# Patient Record
Sex: Female | Born: 1970 | Hispanic: No
Health system: Northeastern US, Academic
[De-identification: ages and names within clinical notes are randomized; demographics above are authoritative.]

## PROBLEM LIST (undated history)

## (undated) DIAGNOSIS — B181 Chronic viral hepatitis B without delta-agent: Secondary | ICD-10-CM

## (undated) DIAGNOSIS — D219 Benign neoplasm of connective and other soft tissue, unspecified: Secondary | ICD-10-CM

## (undated) HISTORY — DX: Chronic viral hepatitis B without delta-agent: B18.1

## (undated) HISTORY — DX: Benign neoplasm of connective and other soft tissue, unspecified: D21.9

## (undated) MED FILL — WEGOVY 0.25 MG/0.5 ML SUBCUTANEOUS PEN INJECTOR: 0.25 0.25 mg/0.5 mL | SUBCUTANEOUS | 28 days supply | Qty: 2 | Fill #0 | Status: CN

---

## 2014-05-21 ENCOUNTER — Other Ambulatory Visit: Payer: Self-pay | Admitting: Obstetrics & Gynecology

## 2014-05-21 DIAGNOSIS — Z006 Encounter for examination for normal comparison and control in clinical research program: Secondary | ICD-10-CM

## 2014-05-27 ENCOUNTER — Ambulatory Visit
Admission: RE | Admit: 2014-05-27 | Discharge: 2014-05-27 | Disposition: A | Discharge status: Home / Self Care | Payer: No Typology Code available for payment source | Source: Ambulatory Visit | Attending: Obstetrics & Gynecology | Admitting: Obstetrics & Gynecology

## 2014-05-27 DIAGNOSIS — Z006 Encounter for examination for normal comparison and control in clinical research program: Secondary | ICD-10-CM

## 2014-06-09 ENCOUNTER — Ambulatory Visit: Payer: No Typology Code available for payment source | Attending: Internal Medicine

## 2014-10-21 ENCOUNTER — Encounter: Payer: Self-pay | Admitting: Internal Medicine

## 2014-10-21 ENCOUNTER — Ambulatory Visit: Payer: Self-pay | Attending: Internal Medicine | Admitting: Internal Medicine

## 2014-10-21 VITALS — BP 125/85 | HR 81 | Temp 98.3°F | Resp 16 | Wt 222.8 lb

## 2014-10-21 DIAGNOSIS — Z23 Encounter for immunization: Secondary | ICD-10-CM

## 2014-10-21 DIAGNOSIS — R894 Abnormal immunological findings in specimens from other organs, systems and tissues: Secondary | ICD-10-CM

## 2014-10-21 DIAGNOSIS — R768 Other specified abnormal immunological findings in serum: Secondary | ICD-10-CM | POA: Insufficient documentation

## 2014-10-21 DIAGNOSIS — D259 Leiomyoma of uterus, unspecified: Secondary | ICD-10-CM

## 2014-10-21 LAB — BASIC METABOLIC PANEL
BUN: 7 mg/dL (ref 7–25)
CALCIUM: 8.8 mg/dL (ref 8.6–10.2)
CO2: 27 mmol/L (ref 20–31)
Chloride: 107 mmol/L (ref 98–110)
Creat: 0.48 mg/dL — ABNORMAL LOW (ref 0.50–1.10)
GLUCOSE: 86 mg/dL (ref 65–99)
Potassium: 4 mmol/L (ref 3.5–5.3)
Sodium: 140 mmol/L (ref 135–146)

## 2014-10-21 LAB — CBC WITH DIFFERENTIAL/PLATELET
BASOS PCT: 0 % (ref 0–1)
Basophils Absolute: 0 10*3/uL (ref 0.0–0.1)
EOS ABS: 0.1 10*3/uL (ref 0.0–0.7)
EOS PCT: 2 % (ref 0–5)
HCT: 31.6 % — ABNORMAL LOW (ref 36.0–46.0)
Hemoglobin: 9.1 g/dL — ABNORMAL LOW (ref 12.0–15.0)
Lymphocytes Relative: 38 % (ref 12–46)
Lymphs Abs: 2.2 10*3/uL (ref 0.7–4.0)
MCH: 20.4 pg — ABNORMAL LOW (ref 26.0–34.0)
MCHC: 28.8 g/dL — AB (ref 30.0–36.0)
MCV: 70.7 fL — ABNORMAL LOW (ref 78.0–100.0)
MONO ABS: 0.5 10*3/uL (ref 0.1–1.0)
MPV: 9.2 fL (ref 8.6–12.4)
Monocytes Relative: 8 % (ref 3–12)
Neutro Abs: 3 10*3/uL (ref 1.7–7.7)
Neutrophils Relative %: 52 % (ref 43–77)
PLATELETS: 387 10*3/uL (ref 150–400)
RBC: 4.47 MIL/uL (ref 3.87–5.11)
RDW: 19.5 % — ABNORMAL HIGH (ref 11.5–15.5)
WBC: 5.7 10*3/uL (ref 4.0–10.5)

## 2014-10-21 NOTE — Patient Instructions (Signed)
I have placed orders, you will receive a call soon from GYN and Infectious Disease   Hepatitis B Hepatitis B is a viral infection of the liver. There are two kinds of hepatitis B:  Acute hepatitis B. Hepatitis B that lasts six months or less is called acute hepatitis B.  Chronic hepatitis B. Hepatitis B that lasts more than six months is called long-term (chronic) hepatitis B. Chronic hepatitis B can lead to liver failure, scarring of the liver (cirrhosis), or liver cancer. Acute hepatitis B can turn into chronic hepatitis B. Most adults with acute hepatitis B do not develop chronic hepatitis B. Infants and young children who get hepatitis B are more likely to develop chronic hepatitis B than adults who get hepatitis B. CAUSES Hepatitis B is caused by the hepatitis B virus (HBV). The virus is passed from one person to another through blood, birth, sex, or bodily fluids, such as:  Breast milk.  Tears.  Saliva. RISK FACTORS The risk factors for hepatitis B include:  Having unprotected sex with someone who is infected.  Using drugs with needles. SIGNS AND SYMPTOMS Symptoms of hepatitis B may include:  Loss of appetite.  Fatigue.  Nausea.  Vomiting.  Stomach pain.  Dark yellow urine.  Yellowish skin and eyes (jaundice). Hepatitis B does not always cause symptoms. DIAGNOSIS Your health care provider will do a blood test to diagnose hepatitis B. TREATMENT You will need to prevent further injury to the liver by avoiding alcohol and medicines that can be hard for the liver to metabolize. Treatment for chronic hepatitis B may include antiviral medicine. This medicine may help:  Lower your risk of liver failure.  Lower your ability to infect others with hepatitis B.  Lower your risk of scarring your liver (cirrhosis).  Lower your risk of liver cancer. HOME CARE INSTRUCTIONS   Rest as needed.  Avoid alcohol.  Take medicines only as directed by your health care  provider.  Do not take any medicine without approval from your health care provider. This includes over-the-counter medicine that is usually taken for fever or pain.  Do not have sex unless approved by your health care provider.  Do not share toothbrushes, nail clippers, razors, or needles with others. SEEK IMMEDIATE MEDICAL CARE IF:   You are unable to eat or drink.  You have a fever along with nausea or vomiting.  You feel confused.  You develop jaundice or your chronic jaundice becomes more severe.  You have trouble breathing.  You develop a rash.  Your skin, throat, mouth, or face becomes swollen.  Your body shakes or twitches (seizure).  You become very sleepy or have trouble waking up.   This information is not intended to replace advice given to you by your health care provider. Make sure you discuss any questions you have with your health care provider.   Document Released: 12/30/1999 Document Revised: 09/22/2014 Document Reviewed: 04/10/2013 Elsevier Interactive Patient Education [employer].

## 2014-10-21 NOTE — Progress Notes (Signed)
Patient ID: Kristin Hicks, female   DOB: 1970-02-15, 44 y.o.   MRN: [id number]   ZLD:[id number]  [id number]  DOB - 02-22-1970  CC:  Chief Complaint  Patient presents with  . Establish Care       HPI: Kristin Hicks is a 44 y.o. female here today to establish medical care. Patient has a past medical history of uterine fibroids and Hepatitis B. She states that she was in a research study with a OB/GYN on [place] for fibroids and found out that she had Hepatitis B in April. She was told to come here to apply for the orange card and get a referral for the infectious disease doctor because several test were completed to confirm diagnosis. She is not sexually active. She has been in the [place] for 14 years. She states that she was told she has very large fibroids that extend up past her navel. Her first 3 days she states that she buys super pads and goes through 1.5 packs of pads in 2 days. She has to use 2-3 super pads at a time. She has no children and does not desire pregnancy. Patient states that she has a hard time voiding due to the tumor pressing on her bladder and bowels. She feels uncomfortable all day.  Patient has No headache, No chest pain, No Nausea, No new weakness tingling or numbness, No Cough - SOB.  Allergies  Allergen Reactions  . Chloroquine    History reviewed. No pertinent past medical history. No current outpatient prescriptions on file prior to visit.   No current facility-administered medications on file prior to visit.   History reviewed. No pertinent family history. Social History   Social History  . Marital Status: Single    Spouse Name: N/A  . Number of Children: N/A  . Years of Education: N/A   Occupational History  . Not on file.   Social History Main Topics  . Smoking status: Never Smoker   . Smokeless tobacco: Not on file  . Alcohol Use: No  . Drug Use: No  . Sexual Activity: Not on file   Other Topics Concern  . Not on file    Social History Narrative  . No narrative on file    Review of Systems: Other than what is stated in HPI, all other systems are negative.   Objective:   Filed Vitals:   10/21/14 1553  BP: 125/85  Pulse: 81  Temp: 98.3 F (36.8 C)  Resp: 16    Physical Exam  Constitutional: She is oriented to person, place, and time.  Eyes: No scleral icterus.  Cardiovascular: Normal rate, regular rhythm and normal heart sounds.   Pulmonary/Chest: Effort normal and breath sounds normal.  Abdominal: Bowel sounds are normal. She exhibits distension and mass. There is no tenderness.  Very large mass palpated in abdomen extending up to navel Unable to palpate liver margins  Neurological: She is alert and oriented to person, place, and time.  Skin: Skin is warm and dry.  Psychiatric: She has a normal mood and affect.     No results found for: WBC, HGB, HCT, MCV, PLT No results found for: CREATININE, BUN, NA, K, CL, CO2  No results found for: HGBA1C Lipid Panel  No results found for: CHOL, TRIG, HDL, CHOLHDL, VLDL, LDLCALC     Assessment and plan:   Yolanda was seen today for establish care.  Diagnoses and all orders for this visit:  Uterine leiomyoma, unspecified location -  Ambulatory referral to Gynecology -     Basic Metabolic Panel -     CBC with Differential Will check hemoglobin and start patient on ferrous sulfate if necessary.   Hepatitis B antibody positive -     Ambulatory referral to Infectious Disease -     Hepatitis panel, acute Since I do not have records of positive Hep B, I will recheck so that ID can have confirmation of results.  I will request all records.  Need for influenza vaccination -     Flu Vaccine QUAD 36+ mos PF IM (Fluarix & Fluzone Quad PF)   Return if symptoms worsen or fail to improve.  The patient was given clear instructions to go to ER or return to medical center if symptoms don't improve, worsen or new problems develop. The patient  verbalized understanding. The patient was told to call to get lab results if they haven't heard anything in the next week.     Lance Bosch, [facility] [phone] 10/21/2014, 4:10 PM

## 2014-10-21 NOTE — Progress Notes (Signed)
Patient is here to establish care Patient also complains of fibroids  Patient states she has Hepatitis B Patient needs referrals to infectious disease and obgyn

## 2014-10-22 LAB — HEPATITIS PANEL, ACUTE
HCV AB: NEGATIVE
Hep A IgM: NONREACTIVE
Hep B C IgM: NONREACTIVE
Hepatitis B Surface Ag: POSITIVE — AB

## 2014-10-22 LAB — HEPATITIS B SURF AG CONFIRMATION: HEPATITIS B SURFACE ANTIGEN CONFIRMATION: POSITIVE — AB

## 2014-10-25 ENCOUNTER — Telehealth: Payer: Self-pay

## 2014-10-25 DIAGNOSIS — D259 Leiomyoma of uterus, unspecified: Secondary | ICD-10-CM | POA: Insufficient documentation

## 2014-10-25 DIAGNOSIS — R768 Other specified abnormal immunological findings in serum: Secondary | ICD-10-CM | POA: Insufficient documentation

## 2014-10-25 MED ORDER — FERROUS SULFATE 325 (65 FE) MG PO TABS: 325.0000 mg | ORAL_TABLET | Freq: Every day | ORAL | Status: AC

## 2014-10-25 NOTE — Telephone Encounter (Signed)
Patient called back please f/u °

## 2014-10-25 NOTE — Telephone Encounter (Signed)
Patient not available Left message on voice  Mail to return  Our call

## 2014-10-25 NOTE — Telephone Encounter (Signed)
-----   Message from Lance Bosch, NP sent at 10/24/2014  9:05 PM EDT ----- Please send ferrous sulfate 325 mg to take BID. She is anemic. Will recheck in 6 weeks

## 2014-10-25 NOTE — Telephone Encounter (Signed)
Patient returned phone call. Please f/u with pt.

## 2014-10-26 ENCOUNTER — Telehealth: Payer: Self-pay

## 2014-10-26 ENCOUNTER — Other Ambulatory Visit: Payer: Self-pay | Admitting: General Practice

## 2014-10-26 ENCOUNTER — Encounter: Payer: Self-pay | Admitting: Obstetrics & Gynecology

## 2014-10-26 DIAGNOSIS — D259 Leiomyoma of uterus, unspecified: Secondary | ICD-10-CM

## 2014-10-26 NOTE — Telephone Encounter (Signed)
Patient returned call. Please f/u with pt.

## 2014-10-26 NOTE — Telephone Encounter (Signed)
Returned patient phone call Patient not available Left message on voice mail to return our call 

## 2014-11-11 ENCOUNTER — Ambulatory Visit (HOSPITAL_COMMUNITY)
Admission: RE | Admit: 2014-11-11 | Discharge: 2014-11-11 | Disposition: A | Discharge status: Home / Self Care | Payer: No Typology Code available for payment source | Source: Ambulatory Visit | Attending: Family Medicine | Admitting: Family Medicine

## 2014-11-11 DIAGNOSIS — N920 Excessive and frequent menstruation with regular cycle: Secondary | ICD-10-CM | POA: Insufficient documentation

## 2014-11-11 DIAGNOSIS — D250 Submucous leiomyoma of uterus: Secondary | ICD-10-CM | POA: Insufficient documentation

## 2014-11-11 DIAGNOSIS — D259 Leiomyoma of uterus, unspecified: Secondary | ICD-10-CM

## 2014-11-15 ENCOUNTER — Ambulatory Visit (INDEPENDENT_AMBULATORY_CARE_PROVIDER_SITE_OTHER): Payer: No Typology Code available for payment source | Admitting: Obstetrics & Gynecology

## 2014-11-15 ENCOUNTER — Encounter: Payer: Self-pay | Admitting: Obstetrics & Gynecology

## 2014-11-15 VITALS — BP 101/68 | HR 70 | Temp 98.2°F | Resp 16 | Ht 65.0 in | Wt 220.9 lb

## 2014-11-15 DIAGNOSIS — D259 Leiomyoma of uterus, unspecified: Secondary | ICD-10-CM

## 2014-11-15 MED ORDER — MEDROXYPROGESTERONE ACETATE 10 MG PO TABS: 20.0000 mg | ORAL_TABLET | Freq: Every day | ORAL | Status: AC

## 2014-11-15 NOTE — Patient Instructions (Signed)
Leuprolide injection  What is this medicine?  LEUPROLIDE (loo PROE lide) is a man-made hormone. It is used to treat the symptoms of prostate cancer. This medicine may also be used to treat children with early onset of puberty. It may be used for other hormonal conditions.  This medicine may be used for other purposes; ask your health care provider or pharmacist if you have questions.  What should I tell my health care provider before I take this medicine?  They need to know if you have any of these conditions:  -diabetes  -heart disease or previous heart attack  -high blood pressure  -high cholesterol  -pain or difficulty passing urine  -spinal cord metastasis  -stroke  -tobacco smoker  -an unusual or allergic reaction to leuprolide, benzyl alcohol, other medicines, foods, dyes, or preservatives  -pregnant or trying to get pregnant  -breast-feeding  How should I use this medicine?  This medicine is for injection under the skin or into a muscle. You will be taught how to prepare and give this medicine. Use exactly as directed. Take your medicine at regular intervals. Do not take your medicine more often than directed.  It is important that you put your used needles and syringes in a special sharps container. Do not put them in a trash can. If you do not have a sharps container, call your pharmacist or healthcare provider to get one.  Talk to your pediatrician regarding the use of this medicine in children. While this medicine may be prescribed for children as young as 8 years for selected conditions, precautions do apply.  Overdosage: If you think you have taken too much of this medicine contact a poison control center or emergency room at once.  NOTE: This medicine is only for you. Do not share this medicine with others.  What if I miss a dose?  If you miss a dose, take it as soon as you can. If it is almost time for your next dose, take only that dose. Do not take double or extra doses.  What may interact with this  medicine?  Do not take this medicine with any of the following medications:  -chasteberry  This medicine may also interact with the following medications:  -herbal or dietary supplements, like black cohosh or DHEA  -female hormones, like estrogens or progestins and birth control pills, patches, rings, or injections  -female hormones, like testosterone  This list may not describe all possible interactions. Give your health care provider a list of all the medicines, herbs, non-prescription drugs, or dietary supplements you use. Also tell them if you smoke, drink alcohol, or use illegal drugs. Some items may interact with your medicine.  What should I watch for while using this medicine?  Visit your doctor or health care professional for regular checks on your progress. During the first week, your symptoms may get worse, but then will improve as you continue your treatment. You may get hot flashes, increased bone pain, increased difficulty passing urine, or an aggravation of nerve symptoms. Discuss these effects with your doctor or health care professional, some of them may improve with continued use of this medicine.  Female patients may experience a menstrual cycle or spotting during the first 2 months of therapy with this medicine. If this continues, contact your doctor or health care professional.  What side effects may I notice from receiving this medicine?  Side effects that you should report to your doctor or health care professional as soon as   swelling of the feet and legs -visual changes -vomiting Side effects that usually do not require medical attention (report to your doctor or health care professional if they continue or are bothersome): -breast swelling or  tenderness -decrease in sex drive or performance -diarrhea -hot flashes -loss of appetite -muscle, joint, or bone pains -nausea -redness or irritation at site where injected -skin problems or acne This list may not describe all possible side effects. Call your doctor for medical advice about side effects. You may report side effects to FDA at 1-800-FDA-1088. Where should I keep my medicine? Keep out of the reach of children. Store below 25 degrees C (77 degrees F). Do not freeze. Protect from light. Do not use if it is not clear or if there are particles present. Throw away any unused medicine after the expiration date. NOTE: This sheet is a summary. It may not cover all possible information. If you have questions about this medicine, talk to your doctor, pharmacist, or health care provider.    2016, Elsevier/Gold Standard. (2008-05-18 13:26:20) Uterine Fibroids Uterine fibroids are tissue masses (tumors) that can develop in the womb (uterus). They are also called leiomyomas. This type of tumor is not cancerous (benign) and does not spread to other parts of the body outside of the pelvic area, which is between the hip bones. Occasionally, fibroids may develop in the fallopian tubes, in the cervix, or on the support structures (ligaments) that surround the uterus. You can have one or many fibroids. Fibroids can vary in size, weight, and where they grow in the uterus. Some can become quite large. Most fibroids do not require medical treatment. CAUSES A fibroid can develop when a single uterine cell keeps growing (replicating). Most cells in the human body have a control mechanism that keeps them from replicating without control. SIGNS AND SYMPTOMS Symptoms may include:   Heavy bleeding during your period.  Bleeding or spotting between periods.  Pelvic pain and pressure.  Bladder problems, such as needing to urinate more often (urinary frequency) or urgently.  Inability to reproduce  offspring (infertility).  Miscarriages. DIAGNOSIS Uterine fibroids are diagnosed through a physical exam. Your health care provider may feel the lumpy tumors during a pelvic exam. Ultrasonography and an MRI may be done to determine the size, location, and number of fibroids. TREATMENT Treatment may include:  Watchful waiting. This involves getting the fibroid checked by your health care provider to see if it grows or shrinks. Follow your health care provider's recommendations for how often to have this checked.  Hormone medicines. These can be taken by mouth or given through an intrauterine device (IUD).  Surgery.  Removing the fibroids (myomectomy) or the uterus (hysterectomy).  Removing blood supply to the fibroids (uterine artery embolization). If fibroids interfere with your fertility and you want to become pregnant, your health care provider may recommend having the fibroids removed.  HOME CARE INSTRUCTIONS  Keep all follow-up visits as directed by your health care provider. This is important.  Take medicines only as directed by your health care provider.  If you were prescribed a hormone treatment, take the hormone medicines exactly as directed.  Do not take aspirin, because it can cause bleeding.  Ask your health care provider about taking iron pills and increasing the amount of dark green, leafy vegetables in your diet. These actions can help to boost your blood iron levels, which may be affected by heavy menstrual bleeding.  Pay close attention to your period and tell your health  care provider about any changes, such as:  Increased blood flow that requires you to use more pads or tampons than usual per month.  A change in the number of days that your period lasts per month.  A change in symptoms that are associated with your period, such as abdominal cramping or back pain. SEEK MEDICAL CARE IF:  You have pelvic pain, back pain, or abdominal cramps that cannot be  controlled with medicines.  You have an increase in bleeding between and during periods.  You soak tampons or pads in a half hour or less.  You feel lightheaded, extra tired, or weak. SEEK IMMEDIATE MEDICAL CARE IF:  You faint.  You have a sudden increase in pelvic pain.   This information is not intended to replace advice given to you by your health care provider. Make sure you discuss any questions you have with your health care provider.   Document Released: 12/30/1999 Document Revised: 01/22/2014 Document Reviewed: 06/30/2013 Elsevier Interactive Patient Education [employer].

## 2014-11-15 NOTE — Progress Notes (Signed)
Patient ID: Kristin Hicks, female   DOB: Jun 15, 1970, 44 y.o.   MRN: [id number]  Chief Complaint  Patient presents with  . Establish Care  . Fibroids    HPI Kristin Hicks is a 44 y.o. female.  [id number] Patient has h/o fibroid uterus and enrolled in a research study in May but was d/q because of positive hep B. She has regular heavy menses 7 days duration.  HPI  Past Medical History  Diagnosis Date  . Fibroids   . Hepatitis B carrier     No past surgical history on file.  Family History  Problem Relation Age of Onset  . Diabetes Mother     Social History Social History  Substance Use Topics  . Smoking status: Never Smoker   . Smokeless tobacco: None  . Alcohol Use: No    Allergies  Allergen Reactions  . Chloroquine     Current Outpatient Prescriptions  Medication Sig Dispense Refill  . ferrous sulfate 325 (65 FE) MG tablet Take 1 tablet (325 mg total) by mouth daily with breakfast. 30 tablet 3  . medroxyPROGESTERone (PROVERA) 10 MG tablet Take 2 tablets (20 mg total) by mouth daily. 60 tablet 2   No current facility-administered medications for this visit.    Review of Systems Review of Systems  Gastrointestinal: Positive for constipation.  Genitourinary: Positive for urgency, frequency, difficulty urinating and pelvic pain (pressure). Negative for vaginal bleeding and vaginal discharge.    Blood pressure 101/68, pulse 70, temperature 98.2 F (36.8 C), temperature source Oral, resp. rate 16, height 5\' 5"  (1.651 m), weight 220 lb 14.4 oz (100.2 kg), last menstrual period 11/03/2014.  Physical Exam Physical Exam  Constitutional: She appears well-developed. No distress.  Pulmonary/Chest: Effort normal.  Abdominal: Soft. She exhibits mass (14-16 weeks). There is no tenderness.  Obese, umbilical hernia  Skin: Skin is warm and dry.  Psychiatric: She has a normal mood and affect. Her behavior is normal.    Data Reviewed CLINICAL DATA: 44 year old  female with a history of uterine fibroids and menorrhagia. LMP 11/03/2014, day 9 of menstrual cycle.  EXAM: TRANSABDOMINAL AND TRANSVAGINAL ULTRASOUND OF PELVIS  TECHNIQUE: Both transabdominal and transvaginal ultrasound examinations of the pelvis were performed. Transabdominal technique was performed for global imaging of the pelvis including uterus, ovaries, adnexal regions, and pelvic cul-de-sac. It was necessary to proceed with endovaginal exam following the transabdominal exam to visualize the endometrium, myometrium, ovaries and adnexa.  COMPARISON: Outside pelvic sonogram from 05/27/2014.  FINDINGS: Uterus  Measurements: 21.8 x 9.7 x 15.9 cm. The anteverted uterus is prominently enlarged by multiple fibroids, with representative fibroids as follows:  - left anterior lower uterine body subserosal 7.3 x 8.0 x 7.5 cm fibroid  - right mid uterine body intramural 5.4 x 4.0 x 5.0 cm fibroid with possible small 20% submucosal component  - right anterior uterine body 3.7 x 2.6 x 3.7 cm fibroid with approximately 40% submucosal component  Endometrium  Endometrial visualization is limited by the mass-effect/distortion by surrounding fibroids. Estimated bilayer endometrial thickness is 0.8 cm, within normal limits. No focal endometrial lesion or endometrial cavity fluid are demonstrated.  Right ovary  Measurements: 2.5 x 1.6 x 3.6 cm. Normal appearance/no adnexal mass.  Left ovary  Measurements: 2.8 x 1.8 x 3.7 cm. There is a questionable 2.7 x 1.9 x 2.5 cm hyperechoic focus at the posterior margin of the left ovary on the transvaginal images, which could represent a dermoid.  Other findings  No abnormal free  fluid in the pelvis.  IMPRESSION: 1. Prominently enlarged myomatous uterus with right uterine submucosal fibroids. 2. Limited endometrial visualization, with no endometrial abnormality detected. 3. Questionable 2.7 cm hyperechoic focus at  the posterior left ovary on the transvaginal images, which could represent a left ovarian dermoid. A pelvic MRI with and without intravenous contrast could be obtained to evaluate the fibroids and this questionable left ovarian structure, if clinically warranted. Otherwise, recommend follow-up pelvic sonogram in 6 months to reassess this left ovarian focus.   Electronically Signed  By: Kristin Hicks M.D.  On: 11/11/2014 10:15  Assessment    Symptomatic fibroid uterus Menorrhagia    Hepatitis B positive  Plan    Provera 20 mg daily Lupron when available Consider TAH, discussed myomectomy as well RTC 4 weeks        Kristin Hicks 11/15/2014, 2:21 PM

## 2014-11-23 ENCOUNTER — Ambulatory Visit (INDEPENDENT_AMBULATORY_CARE_PROVIDER_SITE_OTHER): Payer: Self-pay | Admitting: Internal Medicine

## 2014-11-23 ENCOUNTER — Encounter: Payer: Self-pay | Admitting: Internal Medicine

## 2014-11-23 VITALS — BP 117/79 | HR 80 | Temp 97.7°F | Wt 221.0 lb

## 2014-11-23 DIAGNOSIS — B181 Chronic viral hepatitis B without delta-agent: Secondary | ICD-10-CM

## 2014-11-23 NOTE — Progress Notes (Signed)
Rfv: newly diagnosed chronic hepatitis B Subjective:    Patient ID: Kristin Hicks, female    DOB: [birth year], 44 y.o.   MRN: [id number]  HPI  Kristin Hicks is a 44yo F originally from [place] recently diagnosed with chronic hep B while attending gyn appt, evaluated study for fibroids.she Went to community clinic to start getting hep A/B vaccination. Referred to the clinic for further management. She denies any episode of jaundice.  Allergies  Allergen Reactions  . Chloroquine    Current Outpatient Prescriptions on File Prior to Visit  Medication Sig Dispense Refill  . ferrous sulfate 325 (65 FE) MG tablet Take 1 tablet (325 mg total) by mouth daily with breakfast. 30 tablet 3  . medroxyPROGESTERone (PROVERA) 10 MG tablet Take 2 tablets (20 mg total) by mouth daily. (Patient not taking: Reported on 11/23/2014) 60 tablet 2   No current facility-administered medications on file prior to visit.   Active Ambulatory Problems    Diagnosis Date Noted  . Fibroid, uterine 10/25/2014  . Hepatitis B antibody positive 10/25/2014   Resolved Ambulatory Problems    Diagnosis Date Noted  . No Resolved Ambulatory Problems   Past Medical History  Diagnosis Date  . Fibroids   . Hepatitis B carrier    Social History  Substance Use Topics  . Smoking status: Never Smoker   . Smokeless tobacco: None  . Alcohol Use: No  family history includes Diabetes in her mother.  Review of Systems Review of Systems  Constitutional: Negative for fever, chills, diaphoresis, activity change, appetite change, fatigue and unexpected weight change.  HENT: Negative for congestion, sore throat, rhinorrhea, sneezing, trouble swallowing and sinus pressure.  Eyes: Negative for photophobia and visual disturbance.  Respiratory: Negative for cough, chest tightness, shortness of breath, wheezing and stridor.  Cardiovascular: Negative for chest pain, palpitations and leg swelling.  Gastrointestinal: epigastric pain occ in  the evening. Negative for nausea, vomiting, abdominal pain, diarrhea, constipation, blood in stool, abdominal distention and anal bleeding.  Genitourinary: Negative for dysuria, hematuria, flank pain and difficulty urinating.  Musculoskeletal: Negative for myalgias, back pain, joint swelling, arthralgias and gait problem.  Skin: Negative for color change, pallor, rash and wound.  Neurological: Negative for dizziness, tremors, weakness and light-headedness.  Hematological: Negative for adenopathy. Does not bruise/bleed easily.  Psychiatric/Behavioral: Negative for behavioral problems, confusion, sleep disturbance, dysphoric mood, decreased concentration and agitation.       Objective:   Physical Exam BP 117/79 mmHg  Pulse 80  Temp(Src) 97.7 F (36.5 C) (Oral)  Wt 221 lb (100.245 kg)  LMP 11/03/2014 Physical Exam  Constitutional:  oriented to person, place, and time. appears well-developed and well-nourished. No distress.  HENT: [place]/AT, PERRLA, no scleral icterus Mouth/Throat: Oropharynx is clear and moist. No oropharyngeal exudate.  Cardiovascular: Normal rate, regular rhythm and normal heart sounds. Exam reveals no gallop and no friction rub.  No murmur heard.  Pulmonary/Chest: Effort normal and breath sounds normal. No respiratory distress.  has no wheezes.  Neck = supple, no nuchal rigidity Abdominal: Soft. Bowel sounds are normal.  exhibits no distension. There is no tenderness.  Lymphadenopathy: no cervical adenopathy. No axillary adenopathy Neurological: alert and oriented to person, place, and time.  Skin: Skin is warm and dry. No rash noted. No erythema.  Psychiatric: a normal mood and affect.  behavior is normal.   Labs: Hep B S Ag+       Assessment & Plan:  Chronic hep b without hepatic coma = will check hepatitis  labs. abd u/s. Appears to have received hep A vaccination. Pending those results, will decide if needs treatment. Continue with hep a and b vaccine series.

## 2014-11-24 LAB — HEPATITIS B SURFACE ANTIBODY,QUALITATIVE: Hep B S Ab: NEGATIVE

## 2014-11-24 LAB — HEPATITIS A ANTIBODY, TOTAL: HEP A TOTAL AB: REACTIVE — AB

## 2014-11-24 LAB — HEPATITIS B CORE ANTIBODY, TOTAL: Hep B Core Total Ab: REACTIVE — AB

## 2014-11-25 ENCOUNTER — Ambulatory Visit: Payer: Self-pay | Attending: Internal Medicine

## 2014-11-25 LAB — HEPATITIS B E ANTIGEN: HEPATITIS BE ANTIGEN: NONREACTIVE

## 2014-11-25 LAB — HEPATITIS B E ANTIBODY: HEPATITIS BE ANTIBODY: REACTIVE — AB

## 2014-11-29 LAB — HEPATITIS B DNA, ULTRAQUANTITATIVE, PCR
HEPATITIS B DNA: 216 [IU]/mL — AB (ref <20)
Hepatitis B DNA (Calc): 2.33 Log IU/mL — ABNORMAL HIGH (ref <1.30)

## 2014-12-13 ENCOUNTER — Ambulatory Visit: Payer: Self-pay | Admitting: Obstetrics & Gynecology

## 2014-12-14 ENCOUNTER — Ambulatory Visit: Payer: Self-pay | Attending: Internal Medicine

## 2014-12-15 ENCOUNTER — Ambulatory Visit: Payer: Self-pay

## 2014-12-28 ENCOUNTER — Ambulatory Visit: Payer: Self-pay | Admitting: Internal Medicine

## 2015-01-06 ENCOUNTER — Ambulatory Visit: Payer: Self-pay | Admitting: Obstetrics & Gynecology

## 2015-01-20 ENCOUNTER — Ambulatory Visit: Payer: Self-pay | Admitting: Internal Medicine

## 2015-10-10 ENCOUNTER — Ambulatory Visit

## 2015-10-10 NOTE — Telephone Encounter (Signed)
PHONE NOTE     CALL INFORMATION:   Reason for call: Other  Person Calling: Amye Rebstock  Relationship to pt: Pt  PCP: Dr. Alyce Pagan  Time/Date of call: 10/10/2015  Day Phone #: [phone]      CALL DETAILS:   Patient would like Dr. Alyce Pagan as the new primary provider and schedule appointment before 10/16/2015.  But Dr. Alyce Pagan do not have any new patient appoitment in the system.  Patient's contact number is [phone].    Thank you,  Call Taken by: ......................................Marland KitchenCheri Guppy  October 10, 2015 1:03 PM          RESPONSE/ORDERS:  I spoke with patient to let her know that NP appointments are still not available and that I would follow-up to see if we would be able to add her to a NP appointment. I also told patient that I would call her back as soon as a NP appointment becomes available ......................................Marland KitchenCoralyn Pear  October 14, 2015 10:10 AM    I outreached to patient to help schedule a NP PE because Dr. Alcide Goodness schedule has been opened. However, patient established care this afternoon with another provider. If she decides to switch at a later time she will call to do so ......................................Marland KitchenCoralyn Pear  October 14, 2015 3:42 PM               ORDERS/PROBS/MEDS/ALL         Created By Cheri Guppy on 10/10/2015 at 12:57 PM    Electronically Signed By Foster Simpson MD on 10/17/2015 at 10:02 AM

## 2015-10-14 ENCOUNTER — Ambulatory Visit

## 2015-10-14 LAB — HX CHEM-PANELS
HX ANION GAP: 7 (ref 5–18)
HX BLOOD UREA NITROGEN: 7 mg/dL (ref 6–24)
HX CHLORIDE (CL): 110 meq/L (ref 98–110)
HX CO2: 20 meq/L (ref 20–30)
HX CREATININE (CR): 0.65 mg/dL (ref 0.57–1.30)
HX GFR, AFRICAN AMERICAN: 125 mL/min/{1.73_m2}
HX GFR, NON-AFRICAN AMERICAN: 108 mL/min/{1.73_m2}
HX POTASSIUM (K): 3.8 meq/L (ref 3.6–5.1)
HX SODIUM (NA): 137 meq/L (ref 135–145)

## 2015-10-14 LAB — HX HEM-ROUTINE
HX HCT: 28.9 % — ABNORMAL LOW (ref 32.0–45.0)
HX HGB: 8 g/dL — ABNORMAL LOW (ref 11.0–15.0)
HX MCH: 17.7 pg — ABNORMAL LOW (ref 26.0–34.0)
HX MCHC: 27.7 g/dL — ABNORMAL LOW (ref 32.0–36.0)
HX MCV: 63.8 fL — ABNORMAL LOW (ref 80.0–98.0)
HX MPV: 9.1 fL (ref 9.1–11.7)
HX NRBC #: 0 10*3/uL
HX NUCLEATED RBC: 0 %
HX PLT: 311 10*3/uL (ref 150–400)
HX RBC BLOOD COUNT: 4.53 M/uL (ref 3.70–5.00)
HX RDW: 22.7 % — ABNORMAL HIGH (ref 11.5–14.5)
HX WBC: 6.1 10*3/uL (ref 4.0–11.0)

## 2015-10-14 LAB — HX CHOLESTEROL
HX CHOLESTEROL: 219 mg/dL — ABNORMAL HIGH (ref 110–199)
HX HIGH DENSITY LIPOPROTEIN CHOL (HDL): 56 mg/dL (ref 35–75)
HX LDL CHOLESTEROL, DIRECT: 151 mg/dL — ABNORMAL HIGH (ref 0–129)
HX LDL: 149 mg/dL — ABNORMAL HIGH (ref 0–129)
HX TRIGLYCERIDES: 69 mg/dL (ref 40–250)

## 2015-10-14 LAB — HX CHEM-OTHER
HX % IRON SATURATION: 4 % — ABNORMAL LOW (ref 15–40)
HX FERRITIN: 2 ng/mL — ABNORMAL LOW (ref 10–240)
HX IRON: 23 ug/dL — ABNORMAL LOW (ref 37–170)
HX TOTAL IRON BINDING CAPACITY: 559 ug/dL — ABNORMAL HIGH (ref 253–463)
HX TRANSFERRIN: 399 mg/dL — ABNORMAL HIGH (ref 181–331)

## 2015-10-14 LAB — HX CHEM-LIPIDS
HX CHOL-HDL RATIO: 3.9
HX CHOLESTEROL: 219 mg/dL — ABNORMAL HIGH (ref 110–199)
HX HIGH DENSITY LIPOPROTEIN CHOL (HDL): 56 mg/dL (ref 35–75)
HX HOURS FAST: 3 h
HX LDL CHOLESTEROL, DIRECT: 151 mg/dL — ABNORMAL HIGH (ref 0–129)
HX LDL: 149 mg/dL — ABNORMAL HIGH (ref 0–129)
HX TRIGLYCERIDES: 69 mg/dL (ref 40–250)

## 2015-10-14 NOTE — Progress Notes (Signed)
Checkout  Return to Clinic 3 months  Next Visit Notes: 3 months f/u with Dr. Bonnielee Haff  Appointment Made Yes  RTC Date: 01/20/2016  Time: 4pm          Created By Jamse Mead on 10/20/2015 at 07:14 PM    Electronically Signed By Jamse Mead on 10/20/2015 at 07:14 PM

## 2015-10-14 NOTE — Progress Notes (Signed)
General Medicine Visit - Resident note with preceptor addendum  Service Due by Standard Protocol Rules: PATPORTALPIN, PAP SMEAR.    .  Initial Screening   * [place]: [place] ([place])  Ht: 64 in.  Wt: 216.2 lbs.   BMI: 37.24  Temp: 97.5 deg F.     BP (Initial [place] Screening): 119 / 78      BP (Rechecked, Actionable): 119 /   78 mmHg   HR: 84     Healthy Eating materials? Weight Education Handout for high BMI  Travel outside of the [place] in past 28 days:: No  Chronic Pain Assessment: Does pt experience chronic pain ? NO  Smoking Assessment: Tobacco use? never smoker  .  Self Management materials provided? Printed handout: Keeping You Healthy  .  Med List: PRINTED by [place] for patient   Med List: PRINTED byfor patient   .  Falls Risk Assessment:   In the past year, have you ... Had no falls  Difficulty with balance? NO  Need assistance with ambulation while here? NO  Behavioral Health Tools:   PHQ2 Screening Score 0  .  .  .  **Resident Note**  .  * Preceptor: Carrolyn Leigh Noreene Larsson)  CC: establish care  .  HPI: Has fibroids that are getting worse.  Almost making her incontinent, c  ausing her pain. Painis a churning, grinding pain that occasionally causes   nausea/vomiting. Periods are very heavy, come regularly once/month.  Last a  bout 5-6 days.  First three days are like a "monsoon," but middle part of h  er period is a little lighter than it used to be.  Has never had any interv  ention for her fibroids.  She thinks she can feel her uterus past her navel  .  Has tried birth control pills before for her fibroids, but this did not   help.  [name]  Has some difficulty with near vision, wears OTC glasses when she is tired,   has headaches.  .  .  Past Medical History:(reviewed)  uterine fibroids  obesity  fibrocystic breast disease  hernia  mid-back pain 2/2 accident  .  Family History: (reviewed)   Mom- died age 52 due to complications of diabetes, also had HTN  Dad- alive, healthy  Grandparents- UNK  Sister with uterine fibroids  Sister  with asthma  No children  .  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  No history of STIs.  Last Pap smear April 2017, normal.  No history of   abnormal Pap smears.  Last mammogram 3 years ago, normal except for fibrocy  stic breast disease.  Vaccines: Unsure when last Tdap was but thinks may have been more than 10 y  ears since her last tetanus vaccine  .  [name]  Review of Systems: ROS negative except for above in HPI.  .  .  PROBLEMS:   PROBLEM CHANGES:  Added new problem of UTERINE  FIBROIDS (ICD-218.9) (ICD10-D25.9) - Signed  Added new problem of OBESITY, BMI 35-39.9, ADULT (ICD-278.00) (ICD10-E66.9)   - Signed  Added new problem of 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)   - Signed  Added new problem of ANNUAL EXAM (ICD-V72.31) (ICD10-Z00.00) - Signed  Added new problem of MAMMOGRAM,SCREENING(ALL) (ICD-V76.12) (ICD10-Z12.31) -   Signed  Added new problem of UMBILICAL HERNIA (ICD-553.1) (ZOX09-U04.5) - Signed  Added new problem of VISION EXAMINATION (ICD-V72.0) (ICD10-Z01.00) - Signed  Added new problem of ANEMIA (ICD-285.9) (ICD10-D64.9) - Signed  Changed problem from ANNUAL EXAM (ICD-V72.31) (ICD10-Z00.00) to ANNUAL EXAM   - SEPT 2017 (ICD-V70.0) (ICD10-Z00.00)  .  MEDICATIONS:   No Known Medications  .  ALLERGIES:   ALLERGY CHANGES:  Added new allergy or adverse reaction of PENICILLIN (Critical) - Signed  Added new allergy or adverse reaction of * CHLOROQUINE (Moderate) - Signed  .  DIRECTIVES:   .  Vitals:   BP: 119 / 78 mmHg Ht: 64 in.  Wt:  216.2 lbs.  BMI (in-lb) 37.24  Temp: 97.5deg F.   Pulse Rate: 84 bpm  .  .  Additional PE: General: alert and oriented in no acute distress, appears 72  ated age, well-groomed, obese  Head: normocephalic/atraumatic  Eyes: PERRL, EOMI, no scleral icterus  Mouth/throat: fair dentition, no exudate  Neck: no lymphadenopathy, supple, no thyromegaly  Heart: RRR, normal S1/S2, no murmurs  Respiratory: lungs clear to auscultation bilaterally with no wheezes, rales  , or rhonchi  Breast: symmetric, no skin changes, no palpable masses, no nipple discharge  Abdomen: + bowel sounds, soft, not distended, nontender, no rebound or guar  ding, no bruits, 2.5x2.5 cm reducible umbilical hernia, uterine fundus palp  able 2-3 cm above the umbilicus on the left and at the level of the umbilic  [place] on the right   Musculoskeletal: FROM and 5/5 muscle strength in all extremities, no joint   swelling or tenderness  Skin: no rashes  Neuro: CN II-XII intact, normal gait   .  .  .  Assessment /T/ Plan:   1. Annual exam  - Tdap and flu vaccine given today  - up to date on Pap smear  - GC, chlamydia, and syphilis testing.  Patient declined HIV testing.  - check HbA1c, lipid profile, CBC, and TSH  - screening mammogram  - referral to [facility] ophthalmology for eye exam  .  2. Uterine fibroids  - pelvic ultrasound  - referral to [facility]  - will check iron studies with CBC given history of heavy periods, and pati  ent also states she has been anemic in the past  .  3. Umbilical hernia  - referral to General Surgery  .  4. Obesity  - referral to Jumpstart to Wellness program  - counselled patient on regular exercise  .  Return to clinic in 3 months for follow up for multiple medical issues.  .  [name]  * Resident Signature (.sign): ......................................Marland KitchenBethan  y Mounds, MD [facility])  October 14, 2015 1:36 PM  .  .  .  ORDERS:  Vaccine TDAP [[id number]-81191 (7189)]  Injection Vaccine, One (4782) - done [CPT-90471]  Vaccine Influenza (8479)  [CPT-90658]  Injection Influenza Admin (8274) [CPT-90471]  RTC in 3 months [RTC-090]  [facility] [Ref-Gyn]  U/S Pelvic [CPT-76856]  Mammo Screening [CPT-77057]  CBC  [CPT-85027]  LYTES [CPT-82495]  BUN [Q30940E,L001040]  Creatinine (CR) [CPT-82565]  Iron/Tibc [CPT-83550]  Ferritin  [CPT-82728]  Hemoglobin A1C (Glycohemoglobin) [CPT-83036]  Lipid  Profile-Chol, HDL, Trig, LDL(calc) [CPT-80061]  LDL Cholesterol, Direct [CPT-1009030]  TSH Reflex [CPT-00000]  RPR [CPT-86592]  Chlamydia [CPT-86631]  GC test [CPT-87591]  [facility] - Jump Start Weight /T/ Wellness [Ref-Nutrition]  [facility], General [Ref-Surgery]  [facility] [Ref-Eye]  New Prev 15-64 yo [phone]) [GNF-62130]  .  [name]  **Preceptor Note**  Resident: Tonny Branch)  Problem New Patient, Physical Exam  .  .  Subjective   Patient is a 45 Years Old Female who presents to clinic here for annual/new   patient visit with several concerns. had lost health insurance for several   years  .  bad fibroids - very heavy periods  + urinary frequency, constant pressure, abdominal discomfort  trial of ocp, made periods worse   .  umbilical hernia - hoping to get fixed   .  originally from [place]  .  fam hx:  M: dm  F: alive, healthy  no children  .  no regular exercise  pap: april 2017 nml, no hx abnml  mammo: 3 yrs ago, nml   not currently sexually active   I discussed the patient with Dr. Bonnielee Haff Toma Copier) in a routine precepting en  counter.  I also personally interviewed and examined the patient.  I agree   with the patient's diagnosis and management.   .  Discussed Services Due: Yes  .  Objective   pleasant woman in no acute distress  see resident's note for additional details.    .  Plan   45 yo woman here to est care/annual, with several concerns  fibroids/menorrhagia: refer to gyn, schedule ultrasound prior to appt. chec  k cbc and iron studies, tsh, given history of heavy periods  weight: lifestyle, refer to weight and wellness  hernia:  surgery   hcm: lipids, chol, lytes, std scrn   flu, tdap  pap: utd   schedule mammo   See resident note for details, Follow-up per resident note, Return visit sc  heduled  .  .  .  .  Patient Care Plan  .  .  .  Immunization Worksheet 2016   .  Influenza VIS VIS handout, Flu 08/21/2013  Tdap VIS VIS handout, Tdap 03/10/2013  ]  .  [name]  Electronically Signed by Ailene Ards, MD on 10/17/2015 at 12:40 PM  ________________________________________________________________________

## 2015-10-16 ENCOUNTER — Ambulatory Visit

## 2015-10-16 NOTE — Progress Notes (Signed)
[facility] October 16, 2015  [address]   [place]  [place] [address]  Main: [phone]  Fax: [phone]  Patient Portal: [email or web address]              [name] [name]  [address]  [place], [place]  [address]          MR#: [id number]          DOB: 08-12-1970    Dear  Ms. [name] [name],    The results of the tests performed during your visit are as follows:  Cholesterol Tests   Cholesterol 219 Normal = 110-199 10/14/2015   HDL (good cholesterol) 56 Normal > 40 10/14/2015   Triglyceride 69 Normal = 40-250 10/14/2015   LDL (bad cholesterol) 149 Normal < 190  (If you are on a cholesterol medicine, the goal is to lower this by at least 50% from your baseline) 10/14/2015   Your risk of heart attack or stroke in the next 10 years is 0.7% If > 7.5%, then consider starting a type of medicine called "statin"     Calculate your own risk here:  http://tools.BuyDucts.dk   For more information, visit   http://tools.TransportationAnalyst.gl      Blood Count Tests   Hematocrit 28.9 Normal = 32-45 (Women)  Normal = 37-47 (Men) 10/14/2015   White Blood Cells 6.1 Normal = 4-11 10/14/2015   Platelets 311 Normal = 150-400 10/14/2015     Thyroid Test   TSH 2.80 Normal = 0.35-4.94 10/14/2015     Kidney Tests   BUN 7 Normal = 6-24 10/14/2015   Creatinine 0.65 Normal = 0.57-1.30 10/14/2015   SODIUM = 137 MEQ/L     (10/14/2015)     (Normal = 135-145)  POTASSIUM = 3.8 MEQ/L     ( 10/14/2015)     (Normal = 3.6-5.1)  BICARB = 20 MEQ/L     (10/14/2015)     (Normal = 20-30)  CHLORIDE = 110 MEQ/L     (10/14/2015)     (Normal = 98-110)    Other Tests   LDL, Direct (H) 151 Normal = 0-129 10/14/2015   Ferritin (L) 2 Normal = 10-240 10/14/2015   Iron (L) 23 Normal = 37-170 10/14/2015   TIBC (H) 559 Normal = 253-463 10/14/2015   Iron % saturation = 4% (10/14/2015)     (Normal = 15-40%)    HgbA1c (diabetes screening test) = 4.9% (10/14/2015)     (Normal =  4.3-5.8%)    STD Testing:  RPR (syphilis) = negative (10/14/2015)  Chlamydia = negative (10/14/2015)  Gonorrhea = negative (10/14/2015)    As we discussed on the phone, you are anemic. Hematocrit is a measure of anemia, and your hematocrit is low indicating that your blood counts are low and you are anemic.  Your iron counts are also low which is likely the cause of your anemia. This is all likely due to your fibroids.    Your cholesterol is also a little bit high.  Because your 10 year risk of having a heart attack or stroke is extremely low, there is no need to be on a medication for your cholesterol.  I would recommend eating a healthy diet and exercising regularly, at least 3-5 days/week for at least 30 minutes as a goal.    Your thyroid function, kidney function, and electrolytes are all normal.  Your STD screening was negative.  Your HgbA1c is also normal which means you do not have diabetes or  pre-diabetes which is great.  Please let me know if you have any questions.                                              Sincerely,       Tonny Branch, MD  [facility]  [phone]    ** Please be aware of our new extended hours to better care for you. Hours are: Monday through Thursday from 8 am to 8 pm; Friday from 8 am to 5 pm; Saturday from 8 am to 12 noon.            Created By Jamse Mead on 10/16/2015 at 06:35 PM    Electronically Signed By Tonny Branch, MD [facility]) on 10/18/2015 at 04:11 PM

## 2015-10-17 ENCOUNTER — Ambulatory Visit

## 2015-10-17 LAB — HX CHEM-METABOLIC
HX HEMOGLOBIN A1C: 4.9 % (ref 4.3–5.8)
HX THYROID STIMULATING HORMONE (TSH): 2.8 u[IU]/mL (ref 0.35–4.94)

## 2015-10-17 LAB — HX DIABETES: HX HEMOGLOBIN A1C: 4.9 % (ref 4.3–5.8)

## 2015-10-17 LAB — HX IMMUNOLOGY: HX RPR: NONREACTIVE

## 2015-10-18 ENCOUNTER — Ambulatory Visit

## 2015-10-18 LAB — HX MOLECULAR BIO
HX CHLAMYDIA DNA PROBE: NEGATIVE
HX CHLAMYDIA DNA PROBE: NEGATIVE
HX GC DNA PROBE: NEGATIVE
HX GC DNA PROBE: NEGATIVE

## 2015-10-18 LAB — HX CHLAMYDIA: HX CHLAMYDIA DNA PROBE: NEGATIVE

## 2015-10-18 NOTE — Telephone Encounter (Signed)
PHONE NOTE     CALL INFORMATION:   Reason for call: Other  Person Calling: [name]  PCP: Trong Gosling  Day Phone #: [phone]      CALL DETAILS:   Good afternoon,    Pt request earlier appt for ultrasound schedueled 10/28/15 for 2pm. Best number to reach pt is [phone]. Thank you.  Call Taken by: ......................................Marland KitchenMatthias Hughs  October 18, 2015 4:41 PM          RESPONSE/ORDERS:  ......................................Marland KitchenVanita Ingles  October 19, 2015 2:23 PM  Patient called back requesting the [place] appointment be scheduled before 2PM. Preferable in the morning is best. The best number to reach the patient is [phone]    Reschedule for pt on 10/11 at 11: am. Thank you!  ......................................Marland KitchenMertha Finders  October 19, 2015 4:40 PM             ORDERS/PROBS/MEDS/ALL     Problems:   UTERINE FIBROIDS (ICD-218.9) (ICD10-D25.9)  OBESITY, BMI 35-39.9, ADULT (ICD-278.00) (ICD10-E66.9)  ANEMIA (ICD-285.9) (ICD10-D64.9)  UMBILICAL HERNIA (ICD-553.1) (QIH47-Q25.9)  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)  VISION EXAMINATION (ICD-V72.0) (ICD10-Z01.00)  ANNUAL EXAM - SEPT 2017 (ICD-V70.0) (ICD10-Z00.00)      MAMMOGRAM,SCREENING(ALL) (ICD-V76.12) (ZDG38-V56.43)    Allergies:   PENICILLIN (Critical)  * CHLOROQUINE (Moderate)          Created By Matthias Hughs on 10/18/2015 at 04:39 PM    Electronically Signed By Tonny Branch, MD ([facility]) on 10/20/2015 at 10:25 AM

## 2015-10-19 ENCOUNTER — Ambulatory Visit

## 2015-10-20 ENCOUNTER — Ambulatory Visit

## 2015-10-20 NOTE — Progress Notes (Signed)
CLINICAL HISTORY PRELOAD       Medical Information   Problems   UTERINE FIBROIDS (ICD-218.9) (ICD10-D25.9)  IRON DEFICIENCY 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) (ICD10-D50.9)  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) (ICD10-B18.1)  OBESITY, BMI 35-39.9, ADULT (ICD-278.00) (ICD10-E66.9)  UMBILICAL HERNIA (ICD-553.1) (BJY78-G95.9)  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)  ANNUAL EXAM - SEPT 2017 (ICD-V70.0) (ICD10-Z00.00)      MAMMOGRAM,SCREENING(ALL) (ICD-V76.12) (AOZ30-Q65.78)    Allergies   PENICILLIN (Critical)  * CHLOROQUINE (Moderate)    Medications   INJECTAFER 750 MG/15ML SOLN (FERRIC CARBOXYMALTOSE) 750 mg IV one time.  Give additional 750 mg IV x1 7 days later.        Services Due: PATPORTALPIN, PAP SMEAR.      Items recorded during this note:  Added new observation of MEDS REVIEW: Done (10/20/2015 10:14)  Added new observation of NKMED: F (10/20/2015 10:14)            Immunization Worksheet 2015           Orders:       Vaccine Info Sheets         Created By Tonny Branch, MD ([facility]) on 10/20/2015 at 10:15 AM    Electronically Signed By Tonny Branch, MD ([facility]) on 10/28/2015 at 08:14 AM

## 2015-10-20 NOTE — Progress Notes (Signed)
[facility] October 20, 2015  [address]   [place]  [place] [address]  Main: [phone]  Fax: [phone]  Patient Portal: [name]  [address]  [place], [name]      Dear  [name],      I am writing to let you know that the following appointments have been made for you:    Mammogram  Date/Time:Friday, 11/18/2015 at 2:15pm  The mammography suite is located in the [facility], 7th floor. Should you need to make any changes in your appointment, please call [phone].    Ultrasound ( Pelvic )  Date/Time: Wednesday, 10/26/2015 at [facility], 4th floor  [phone].      ( Drink 32 oz of liquid 1 hour prior to the appt )      Surgery, General  Date/Time: Friday, 11/18/2015 at 1pm  Doctor: Dr. Alger[facility], 4th floor  [phone].    [facility]  Date/Time: Tuesday, 11/29/2015 at 10am  Nutritionist: Melissa Page  [facility], [place]  [phone].    Gynecology  Tuesday, 12/20/2015 at 9:40am  Dr. Toya Smothers  [facility], 2nd floor  [phone].    GMA  Friday, 01/20/2016 at 4pm  Dr. Electa Sniff Building, 4th floor  [phone].           Please call me if you have any questions.        Sincerely,          Merideth Abbey. [facility]  [phone]                  Created By Jamse Mead on 10/20/2015 at 07:09 PM    Electronically Signed By Tonny Branch, MD [facility]) on 10/25/2015 at 10:41 AM

## 2015-10-21 ENCOUNTER — Ambulatory Visit

## 2015-10-21 NOTE — Progress Notes (Signed)
[facility] October 21, 2015          Main:   Fax:   Patient Portal: [email or web address]              The following appointments have been made for patient::[name]  Date/Time: Wednesday, 01/25/2016 at 1:30pm  Doctor: Dr. Prentiss [facility], 11th floor  [phone].      pt is aware of thea appt    ......................................Marland KitchenJamse Mead  October 21, 2015 9:42 PM            Created By Jamse Mead on 10/21/2015 at 09:43 PM    Electronically Signed By Tonny Branch, MD [facility]) on 10/25/2015 at 10:42 AM

## 2015-10-26 ENCOUNTER — Ambulatory Visit: Admit: 2015-10-26 | Payer: Commercial Managed Care - HMO

## 2015-10-26 ENCOUNTER — Ambulatory Visit

## 2015-10-26 ENCOUNTER — Ambulatory Visit: Admitting: Geriatric Medicine

## 2015-10-26 DIAGNOSIS — D259 Leiomyoma of uterus, unspecified: Principal | ICD-10-CM

## 2015-10-26 NOTE — Telephone Encounter (Signed)
PHONE NOTE     CALL INFORMATION:   Reason for call: Other  Person Calling: Talitha  Relationship to pt: Patient  PCP: Zaivion Kundrat  Day Phone #: [phone]      CALL DETAILS:   hello,    The patient would like a call back from [facility] regarding a hematology appointment. She can be reached at [phone].    Thank you  Call Taken by: ......................................Marland KitchenArlana Hove Venus-Ross  October 26, 2015 12:54 PM          RESPONSE/ORDERS:    Hi Dr. Bonnielee Haff,  Any update for this appt? Are they going to schedule the appt for this pt? Please let me know. Thank you!  ......................................Marland KitchenMertha Finders  October 27, 2015 1:49 PM    Hi [name],  Patient does not require prior auth per her insurance.  I will fax the order form for the IV iron today.  ......................................Marland KitchenTonny Branch, MD [facility])  October 28, 2015 8:14 AM    Hi Dr. Bonnielee Haff,  Called infusion center to comfirm if they received the order. They will schedule the appt for the pt. Called pt regarding to the message. Thank you!  ......................................Marland KitchenMertha Finders  October 31, 2015 1:46 PM    Patient scheduled for 10/27 at 1 pm.  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  Give additional 750 mg IV x1 7 days later.          Created By Carron Curie on  10/26/2015 at 12:51 PM    Electronically Signed By Tonny Branch, MD [facility]) on 11/02/2015 at 01:09 PM

## 2015-11-02 ENCOUNTER — Ambulatory Visit

## 2015-11-02 NOTE — Progress Notes (Signed)
[facility] November 02, 2015  [address]   [place]  [place] [address]  Main: [phone]  Fax: [phone]  Patient Portal: [email or web address]  [address]  [place], [place]  [address]                                MR#: [id number]                                         DOB:09/22/1970    Dear  Ms. SAMUDIO,      The results of the tests performed during your visit are as follows:    Pelvic Ultrasound 10/26/15:  Heterogeneous lobulated uterus with multiple uterine fibroids, the largest a 11.0 cm and exophytic subserosal fibroid arising from the uterine fundus.     As expected, your uterus has several fibroids.  Your ovaries are normal in appearance.    Please call me if you have any questions.        Sincerely,      Tonny Branch, MD [facility])  [facility]  [phone]    ** Please be aware of our new extended hours to better care for you. Hours are: Monday through Thursday from 8 am to 8 pm; Friday from 8 am to 5 pm; Saturday from 8 am to 12 noon.        Created By Tonny Branch, MD [facility]) on 11/02/2015 at 01:41 PM    Electronically Signed By Tonny Branch, MD [facility]) on 11/02/2015 at 01:41 PM

## 2015-11-11 ENCOUNTER — Ambulatory Visit (HOSPITAL_BASED_OUTPATIENT_CLINIC_OR_DEPARTMENT_OTHER)

## 2015-11-11 ENCOUNTER — Ambulatory Visit

## 2015-11-11 ENCOUNTER — Ambulatory Visit: Admitting: Geriatric Medicine

## 2015-11-18 ENCOUNTER — Ambulatory Visit: Admit: 2015-11-18 | Payer: Commercial Managed Care - HMO

## 2015-11-18 ENCOUNTER — Ambulatory Visit

## 2015-11-18 ENCOUNTER — Ambulatory Visit: Admitting: Surgery

## 2015-11-18 ENCOUNTER — Ambulatory Visit: Admitting: Geriatric Medicine

## 2015-11-18 DIAGNOSIS — D509 Iron deficiency anemia, unspecified: Principal | ICD-10-CM

## 2015-11-18 LAB — HX TRANSFUSION

## 2015-11-18 NOTE — Progress Notes (Signed)
.  Progress Notes  .  Patient: [name] [name]  Provider: Andy Gauss D   .  DOB: 1970-08-13 Age: 45 Y Sex: Female  .  PCP: Phillips Grout MD  Date: 11/18/2015  .  --------------------------------------------------------------------------------  .  REASON FOR APPOINTMENT  .  1. UMBILICAL HERNIA  .  HISTORY OF PRESENT ILLNESS  .  GENERAL:  Ms. [name] [name] is a pleasant 45 year old  woman who is here today in surgical consultation for an umbilical  hernia. She reports that the hernia has been present for most of  her life however it has recently increased in size. She reports  that it occasionally causes her discomfort however she denies  nausea, vomiting, diarrhea, constipation, fevers, and chills. She  is here today to discuss her treatment options.  .  CURRENT MEDICATIONS  .  Not-Taking/PRN None  .  PAST MEDICAL HISTORY  .  Uterine fibroids  Obesity  Fibrocystic breast disease  Umbilical hernia  Back pain  .  ALLERGIES  .  penicillin : hives,swelling: Allergy  choroquine: hives, inbalance: Allergy  .  SURGICAL HISTORY  .  Denies Past Surgical History  .  SOCIAL HISTORY  .  .  Tobaccohistory:Never smoked.  .  Alcohol Denies.  [name]  REVIEW OF SYSTEMS  .  Surgery:  .  Constitutional:    no fever, chills, or general weakness, no  recent weight change . H&N:    no ear pain, no hoarseness, no  headache . Skin:    no skin lesions, no rash . Lymphatics:    no  swollen glands, no painful glands . Heart:    no chest pain, no  fluttering of heart . Lung:    no shortness of breath, no new or  frequent cough . Gastro:    no nausea/vomiting, no abdominal pain  or cramps, no constipation or diarrhea, no blood in stool, no  heartburn, no regurgitation . Urine:    no blood in urine, no  pain or burning while urinating . Neuro:    no dizzy spells,  fainting, no visual loss, no speech disturbance, no motor or  sensory events, no seizures . Musc:    no bone or joint pain .  [name]  VITAL SIGNS  .  Pain scale 0, Ht-in 65, Wt-lbs 214,  BMI 35.61, BP 105/72, HR 72,  BSA 2.11, O2 100, Ht-cm 165.1, Wt-kg 97.07, Temp 98.4.  .  PHYSICAL EXAMINATION  .  Surgery:  General Appearance  alert, oriented, and in no distress.  Gastrointestinal  abdomen soft and nontender, reducible umbilical  hernia.  .  ASSESSMENTS  .  Umbilical hernia without obstruction and without gangrene - K42.9  (Primary)  .  TREATMENT  .  Umbilical hernia without obstruction and without  gangrene  Notes: Ms. [name] [name] has a reducible umbilical hernia that has  increased in size and is causing her discomfort. Today her  treatment options were discussed which included an umbilical  hernia repair with mesh or continue surveillance. She wishes to  discuss these options with her family prior to making a decision.  She does plan to have surgery for uterine fibroids in the future  and would like to potentially have the hernia repaired at the  same time. She will contact our office after her appointment with  gynecology next month to let us know how she wishes to proceed.  She will contact our office with any further questions or  concerns arise.Thank you for allowing [place]  to participate in the  care of your patient. If you have any questions or comments,  please do not hesitate to contact our office.  .  FOLLOW UP  .  prn  .  Electronically signed by Andy Gauss , MD on  11/18/2015 at 02:35 PM EDT  .  Document electronically signed by Foster Simpson   .

## 2015-11-18 NOTE — Progress Notes (Signed)
* * *        [name] [name]**    --- ---    45 Y old Female, DOB: [birth year], External MRN: [id number]    Account Number: [id number]    [address], [place], [place]    Home: [phone]    Insurance: [place] [employer] [place]    PCP: Phillips Grout, MD Referring: Ailene Ards    Appointment Facility: Surgical Oncology        * * *    11/18/2015  Progress Notes: Andy Gauss, MD **CHN#:** [phone]    --- ---    ---        Reason for Appointment    ---      1\. UMBILICAL HERNIA    ---      History of Present Illness    ---     _GENERAL_ :    [name] [name] is a pleasant 45 year old woman who is here today in surgical  consultation for an umbilical hernia. She reports that the hernia has been  present for most of her life however it has recently increased in size. She  reports that it occasionally causes her discomfort however she denies nausea,  vomiting, diarrhea, constipation, fevers, and chills. She is here today to  discuss her treatment options.      Current Medications    ---    Not-Taking/PRN     * None     ---      Past Medical History    ---       Uterine fibroids.        ---    Obesity.        ---    Fibrocystic breast disease.        ---    Umbilical hernia.        ---    Back pain.        ---      Surgical History    ---      Denies Past Surgical History    ---      Social History    ---    Tobacco  history: Never smoked.    Alcohol  Denies.      Allergies    ---      penicillin : hives,swelling: Allergy    ---    choroquine: hives, inbalance: Allergy    ---      Review of Systems    ---     _Surgery_ :    Constitutional: no fever, chills, or general weakness, no recent weight  change. H&N: no ear pain, no hoarseness, no headache. Skin: no skin lesions,  no rash. Lymphatics: no swollen glands, no painful glands. Heart: no chest  pain, no fluttering of heart. Lung: no shortness of breath, no new or frequent  cough. Gastro: no nausea/vomiting, no abdominal pain or cramps, no  constipation  or diarrhea, no blood in stool, no heartburn, no regurgitation.  Urine: no blood in urine, no pain or burning while urinating. Neuro: no dizzy  spells, fainting, no visual loss, no speech disturbance, no motor or sensory  events, no seizures. Musc: no bone or joint pain.          Vital Signs    ---    Pain scale 0, Ht-in 65, Wt-lbs 214, BMI 35.61, BP 105/72, HR 72, BSA 2.11, O2  100, Ht-cm 165.1, Wt-kg 97.07, Temp 98.4.      Physical Examination    ---  _Surgery_ :    General Appearance alert, oriented, and in no distress. Gastrointestinal  abdomen soft and nontender, reducible umbilical hernia.          Assessments    ---    1\. Umbilical hernia without obstruction and without gangrene - K42.9  (Primary)    ---      Treatment    ---       **1\. Umbilical hernia without obstruction and without gangrene**    Notes: Ms. Upham has a reducible umbilical hernia that has increased in size  and is causing her discomfort. Today her treatment options were discussed  which included an umbilical hernia repair with mesh or continue surveillance.  She wishes to discuss these options with her family prior to making a  decision. She does plan to have surgery for uterine fibroids in the future and  would like to potentially have the hernia repaired at the same time. She will  contact our office after her appointment with gynecology next month to let us  know how she wishes to proceed. She will contact our office with any further  questions or concerns arise.    Thank you for allowing [place] to participate in the care of your patient. If you  have any questions or comments, please do not hesitate to contact our office.    ---      Follow Up    ---    prn    Electronically signed by Andy Gauss , MD on 11/18/2015 at 02:35 PM EDT    Sign off status: Completed        * * *        Surgical Oncology    [address]    [place], [place] [address]    Tel: [phone]    Fax: [phone]              * * *          Patient: [name] [name], [name] [name] DOB: Oct 10, 1970 Progress Note: Andy Gauss,  MD 11/18/2015    ---    Note generated by eClinicalWorks EMR/PM Software ([email or web address])

## 2015-11-18 NOTE — Progress Notes (Signed)
.  Progress Notes  .  Patient: [name] [name]  Provider: Andy Gauss D   .  DOB: 10-01-1970 Age: 45 Y Sex: Female  .  PCP: [name]    Date: 11/18/2015  .  --------------------------------------------------------------------------------  .  REASON FOR APPOINTMENT  .  1. UMBILICAL HERNIA  .  HISTORY OF PRESENT ILLNESS  .  GENERAL:  [name] [name] is a pleasant 45 year old  woman who is here today in surgical consultation for an umbilical  hernia. She reports that the hernia has been present for most of  her life however it has recently increased in size. She reports  that it occasionally causes her discomfort however she denies  nausea, vomiting, diarrhea, constipation, fevers, and chills. She  is here today to discuss her treatment options.  .  CURRENT MEDICATIONS  .  Not-Taking/PRN None  .  PAST MEDICAL HISTORY  .  Uterine fibroids  Obesity  Fibrocystic breast disease  Umbilical hernia  Back pain  .  ALLERGIES  .  penicillin : hives,swelling: Allergy  choroquine: hives, inbalance: Allergy  .  SURGICAL HISTORY  .  Denies Past Surgical History  .  SOCIAL HISTORY  .  .  Tobaccohistory:Never smoked.  .  Alcohol Denies.  [name]  REVIEW OF SYSTEMS  .  Surgery:  .  Constitutional:    no fever, chills, or general weakness, no  recent weight change . H&N:    no ear pain, no hoarseness, no  headache . Skin:    no skin lesions, no rash . Lymphatics:    no  swollen glands, no painful glands . Heart:    no chest pain, no  fluttering of heart . Lung:    no shortness of breath, no new or  frequent cough . Gastro:    no nausea/vomiting, no abdominal pain  or cramps, no constipation or diarrhea, no blood in stool, no  heartburn, no regurgitation . Urine:    no blood in urine, no  pain or burning while urinating . Neuro:    no dizzy spells,  fainting, no visual loss, no speech disturbance, no motor or  sensory events, no seizures . Musc:    no bone or joint pain .  [name]  VITAL SIGNS  .  Pain scale 0, Ht-in 65, Wt-lbs 214, BMI  35.61, BP 105/72, HR 72,  BSA 2.11, O2 100, Ht-cm 165.1, Wt-kg 97.07, Temp 98.4.  .  PHYSICAL EXAMINATION  .  Surgery:  General Appearance  alert, oriented, and in no distress.  Gastrointestinal  abdomen soft and nontender, reducible umbilical  hernia.  .  ASSESSMENTS  .  Umbilical hernia without obstruction and without gangrene - K42.9  (Primary)  .  TREATMENT  .  Umbilical hernia without obstruction and without  gangrene  Notes: [name] [name] has a reducible umbilical hernia that has  increased in size and is causing her discomfort. Today her  treatment options were discussed which included an umbilical  hernia repair with mesh or continue surveillance. She wishes to  discuss these options with her family prior to making a decision.  She does plan to have surgery for uterine fibroids in the future  and would like to potentially have the hernia repaired at the  same time. She will contact our office after her appointment with  gynecology next month to let us know how she wishes to proceed.  She will contact our office with any further questions or  concerns arise.Thank you for allowing [place]  to participate in the  care of your patient. If you have any questions or comments,  please do not hesitate to contact our office.  .  FOLLOW UP  .  prn  .  Electronically signed by Andy Gauss , MD on  11/18/2015 at 02:35 PM EDT  .  ADDENDUM:  03/21/2016 09:20 AM McGunigle, Amil Amen > Per MD pt reported taking hormonal contraception in the past, c/o continued irregular vaginal bleeding. [employer] fundraiser  .  Document electronically signed by Foster Simpson   .

## 2015-11-21 ENCOUNTER — Ambulatory Visit

## 2015-11-24 ENCOUNTER — Ambulatory Visit

## 2015-11-29 ENCOUNTER — Ambulatory Visit

## 2015-11-29 NOTE — Telephone Encounter (Signed)
PHONE NOTE     CALL INFORMATION:   Reason for call: Other  Person Calling: [name]  Relationship to pt: Pt  PCP: [place]      CALL DETAILS:   Patient returning Brad Liang's call to help schedule her upcoming appointments with him. I could not get her connected so she decided to ask for a return call and we went over the appointments she currently has schedulued. Patient [facility] so she will need to reschedule that as well.  Best Number: [phone]  Call Taken by: ......................................Marland KitchenLajuana Carry  November 29, 2015 8:11 AM          RESPONSE/ORDERS:  Called pt and reschedule the weight and wellness appt. I will scheduel the others for her as well. Thank you!  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  Give additional 750 mg IV x1 7 days later.          Created By Lajuana Carry on 11/29/2015 at 08:06 AM    Electronically Signed By Tonny Branch, MD [facility]) on 12/03/2015 at 08:10 PM

## 2015-12-01 ENCOUNTER — Ambulatory Visit

## 2015-12-01 NOTE — Progress Notes (Signed)
[facility] December 01, 2015  [address]   [place]  [place] [address]  Main: [phone]  Fax: [phone]  Patient Portal: [name]  [address]  [place], [place]  [address]  MR#: [id number]      Dear  [name],      I am writing to let you know that the following appointments have been made for you:    Mammogram  Date/Time: Thursday, 01/12/2016 at 11:20 am  The mammography suite is located in the [facility], 7th floor. Should you need to make any changes in your appointment, please call [phone].    General Surgery  Date/Time: Thursday, 01/19/2015 1:00 pm  Doctor: Sherryll Burger  The General Surgery suite is located in the [facility], 4th floor. Should you need to make any changes in your appointment, their office number is [phone].    Please call me if you have any questions.        Sincerely,        [name] / Office of Tonny Branch, MD [facility]),   [facility]                    Created By [name] on 12/01/2015 at 03:15 PM    Electronically Signed By Tonny Branch, MD [facility] [facility] on 12/03/2015 at 08:08 PM

## 2015-12-02 ENCOUNTER — Ambulatory Visit: Admitting: Internal Medicine

## 2015-12-09 ENCOUNTER — Ambulatory Visit

## 2015-12-09 ENCOUNTER — Ambulatory Visit: Admitting: Internal Medicine

## 2015-12-09 LAB — HX CHEM-LFT
HX ALANINE AMINOTRANSFERASE (ALT/SGPT): 31 IU/L (ref 0–54)
HX ALKALINE PHOSPHATASE (ALK): 75 IU/L (ref 40–130)
HX ASPARTATE AMINOTRANFERASE (AST/SGOT): 30 IU/L (ref 10–42)
HX BILIRUBIN, TOTAL: 0.3 mg/dL (ref 0.2–1.1)

## 2015-12-09 LAB — HX OTHER TESTS: HX HEPATITIS C ANTIBODY: NONREACTIVE

## 2015-12-12 ENCOUNTER — Ambulatory Visit

## 2015-12-12 NOTE — Progress Notes (Signed)
[facility] December 12, 2015  [address]   [place]  [place] [address]  Main: [phone]  Fax: [phone]  Patient Portal: [email or web address]              [name] [name]  [address]  [place], [place]  [address]          MR#: [id number]          DOB: [birth year]    Dear  [name],    The results of the tests performed during your visit are as follows:  Liver Tests   ALT 31 Normal = 0-54 12/09/2015   AST 30 Normal = 10-42 12/09/2015     HBS Ag Confirmation  [A]  Positive  Hep B Core Ab        [A]  Reactive   Hep B E Ab       [A]  Reactive      HBS Ab                    1.4 mIU/mL                  0.0 - 7.9    HCV Ab                    Non Reactive    HAV, IgG             [A]  Reactive      AFP                       2.0 ng/mL                   0.0 - 9.0    Bili, Total               0.3 mg/dL                   0.2 - 1.1   Alk Phos                  75 IU/L                     40 - 130    Ultrasound Abdomen = evidence of fatty liver, no liver mass    Your lab results confirm that you have chronic hepatitis B.  Your liver function tests are all normal which is good.  Your ultrasound of your liver shows no tumor or mass.  It does show fatty liver which is most likley related to weight, diet, and exercise.  At your next clinic visit in January, I would like to order one more set of lab tests called Fibrosure which looks to see if there is any damage being caused to your liver by the Hepatitis B virus not seen with the above blood tests.      Please let me know if you have any questions.      Sincerely,       Tonny Branch, MD [facility]),   [facility]  [phone]      **Please be aware of our new extended hours to better care for you. Hours are: Monday through Thursday from 8 am to 8 pm; Friday from 8 am to 5 pm; Saturday from 8 am to 12 noon.        Created By [name] on 12/12/2015 at 02:46 PM  Electronically Signed By Tonny Branch, MD [facility]) on 12/22/2015 at  02:30 PM

## 2015-12-14 ENCOUNTER — Ambulatory Visit

## 2015-12-14 LAB — HX IMMUNOLOGY
HX AFP TUMOR MARKER: 2 ng/mL (ref 0.0–9.0)
HX ANTI-HEPATITIS BE ANTIBODY: REACTIVE — AB
HX HEP B VIRAL DNA: 4122 {copies}/mL
HX HEPATITIS A IGG: REACTIVE — AB
HX HEPATITIS B CORE ANTIBODY: REACTIVE — AB
HX HEPATITIS B SURFACE AG CONFIRM: POSITIVE — AB
HX HEPATITIS B SURFACE ANTIBODY: 1.4 m[IU]/mL (ref 0.0–7.9)
HX HEPATITIS C ANTIBODY: NONREACTIVE

## 2015-12-14 NOTE — Telephone Encounter (Signed)
PHONE NOTE     CALL INFORMATION:     Person Calling: [place]  Relationship to pt: [place] [employer]  PCP: [place]  Day Phone #: [phone]      CALL DETAILS:   pt was testeed positive for  hep B on 11/24, would like to know if pt was pregnant or not   Call Taken by: .....................................Marland KitchenMarland KitchenSunday Shams  December 14, 2015 10:57 AM          RESPONSE/ORDERS:  I'm not sure, I didn't see her , but will let Dr Bonnielee Haff respond.......................................Marland KitchenMassie Maroon, MD  December 14, 2015 11:44 AM    Returned call, no answer.  Left message for Irven Baltimore that patient is not pregnant.  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  Give additional 750 mg IV x1 7 days later.          Created By Sunday Shams on 12/14/2015 at 10:57 AM    Electronically Signed By Tonny Branch, MD [facility]) on 12/14/2015 at 03:07 PM

## 2015-12-21 ENCOUNTER — Ambulatory Visit: Admitting: Obstetrics & Gynecology

## 2015-12-21 NOTE — Progress Notes (Signed)
* * *        **  [name]**    --- ---    58 Y old Female, DOB: 01-23-1970    [address], [place], [place] [address]    Home: [phone]    Provider: Toya Smothers, MD        * * *    Telephone Encounter    ---    Answered by   Tilden Dome  Date: 12/21/2015         Time: 02:21 PM    Reason   No Show    --- ---            Message                      Missed appointment notice card sent to patient to reschedule.                Action Taken   Pishkin,Lori 12/21/2015 2:21:48 PM >                * * *                ---          * * *          PatientNESIAH, JUMP DOB: 1970/07/26 Provider: Toya Smothers, MD  12/21/2015    ---    Note generated by eClinicalWorks EMR/PM Software ([email or web address])

## 2015-12-22 ENCOUNTER — Ambulatory Visit

## 2015-12-22 NOTE — Telephone Encounter (Signed)
PHONE NOTE     CALL INFORMATION:   Reason for call: Results      CALL DETAILS:   Called patient to review lab and ultrasound results.  No answer, left message.  ......................................Marland KitchenTonny Branch, MD [facility])  December 22, 2015 2:26 PM          RESPONSE/ORDERS:    Patient returned call and prefers a call back from Dr. Bonnielee Haff regarding the message above.  Patient's contact number is [phone].  ......................................Marland KitchenCheri Guppy  December 28, 2015 4:12 PM  forwarded ......................................Marland KitchenRowan Blase, RN  December 28, 2015 5:02 PM    Spoke with patient on the phone. Reviewed test results with patient. Also reviewed upcoming appointment times with patient.  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  Give additional 750 mg IV x1 7 days later.          Created By Tonny Branch, MD [facility]) on 12/22/2015 at 02:25 PM    Electronically Signed By Tonny Branch, MD [facility]) on 12/30/2015 at 01:55 PM

## 2015-12-28 ENCOUNTER — Ambulatory Visit: Admitting: Obstetrics & Gynecology

## 2015-12-28 NOTE — Progress Notes (Signed)
* * *        **  [name] [name]**    --- ---    68 Y old Female, DOB: 09-03-1970    [address], [place], [place] [address]    Home: [phone]    Provider: Toya Smothers, MD        * * *    Telephone Encounter    ---    Answered by   Gae Gallop  Date: 12/28/2015         Time: 03:51 PM    Caller   Jeani    --- ---            Reason   FIBROIDS/Reschedule/Missed Appt            Message                      Hello,      Patient calling in regards to recieving a card that states she missed an appointment on 12/5. Patient states when she went to the hospital in Primary Care they told her the appointment was on 1/5 with Dr. Renato Gails. Patient is upset and would still like to be seen on that day for Fibroids or can come in earlier. Patient states she can also come in tomorrow. Best number to reach her is [phone]. Thank you!                Action Taken   [facility] 12/28/2015 3:55:18 PM > appt. made for 03/01/16  with Dr. Bobetta Lime 12/29/2015 8:19:11 AM >                * * *                ---          * * *          Patient: [name] [name], [name] [name] DOB: [birth year] Provider: Toya Smothers, MD  12/28/2015    ---    Note generated by eClinicalWorks EMR/PM Software ([email or web address])

## 2016-01-12 ENCOUNTER — Ambulatory Visit

## 2016-01-12 NOTE — Telephone Encounter (Signed)
Call Details:   TOLUWANIMI RADEBAUGH (Age: 45 Years Old)  Phone -- Home: [phone] Cell:     [name] [name] (Patient) called on January 12, 2016 10:38 AM.  Message taken by: Lajuana Carry  Primary call-back number: [phone]  Secondary call-back number: () -  Call Reason(s): Message/Call-Back      ** MESSAGE / CALL-BACK.  Regarding: Patient cancelled the Mammogram set up for today and would like a call to reschedule at [phone]  Requesting a call from: [name]    ---------- ---------- ---------- ---------- ---------- ----------       RESPONSE/ORDERS:    Pt called Mammogram Reschedule the appt to 02/23/2016. Thank you!  ......................................Marland KitchenMertha Finders  January 12, 2016 11:55 AM    Noted. Thanks for the update!  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  Give additional 750 mg IV x1 7 days later.          Created By Lajuana Carry on 01/12/2016 at 10:38 AM    Electronically Signed By Tonny Branch, MD [facility]) on 01/12/2016 at 01:25 PM

## 2016-01-12 NOTE — Progress Notes (Signed)
[facility] January 12, 2016  [address]   [place]  [place] [address]  Main: [phone]  Fax: [phone]  Patient Portal: [email or web address]  [address]  [place], [place]  [address]  MR#: [id number]      Dear  Ms. ORWICK,      I am writing to let you know that the following appointments have been made for you:    Diagnostic Mammogram and Breast Ultrasound rescheduled for 02/23/16 at 11:00am located [facility] 7th floor [phone].        Please call me if you have any questions.        Sincerely,          Marcy Panning  [facility]  [phone]                Created By Marcy Panning on 01/12/2016 at 10:49 AM    Electronically Signed By Tonny Branch, MD [facility]) on 01/12/2016 at 10:51 AM

## 2016-01-16 HISTORY — PX: TOTAL ABDOMINAL HYSTERECTOMY: SHX209

## 2016-01-16 HISTORY — PX: SALPINGECTOMY: SHX328

## 2016-01-16 HISTORY — PX: UMBILICAL HERNIA REPAIR: SHX196

## 2016-01-17 ENCOUNTER — Ambulatory Visit

## 2016-01-17 NOTE — Telephone Encounter (Signed)
Call Details:   [name] [name] (Age: 46 Years Old)  Phone -- Home: [phone] Cell:     [name] [name] (Patient) called on January 17, 2016 8:54 AM.  Message taken by: Lajuana Carry  Primary call-back number: [phone]  Secondary call-back number: () -  Call Reason(s): Message/Call-Back      ** MESSAGE / CALL-BACK.  Regarding: The patient says that in December she missed an [facility]. It was going to be her first so she would like a call back to reschedule it.  Best Number: [phone]  Requesting a call from: [name]    ---------- ---------- ---------- ---------- ---------- ----------       RESPONSE/ORDERS:  CAlled pt and give the eye center phone number to pt. She will call them for appt. Thank you!  ......................................Marland KitchenMertha Finders  January 17, 2016 3:37 PM    Noted. Thanks Brad!  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  Give additional 750 mg IV x1 7 days later.          Created By Lajuana Carry on 01/17/2016 at 08:54 AM    Electronically Signed By Tonny Branch, MD [facility]) on 01/17/2016 at 03:42 PM

## 2016-01-20 ENCOUNTER — Ambulatory Visit: Admit: 2016-01-20 | Payer: Commercial Managed Care - HMO

## 2016-01-20 ENCOUNTER — Ambulatory Visit

## 2016-01-20 ENCOUNTER — Ambulatory Visit: Admitting: Internal Medicine

## 2016-01-20 DIAGNOSIS — N6019 Diffuse cystic mastopathy of unspecified breast: Secondary | ICD-10-CM

## 2016-01-20 DIAGNOSIS — D509 Iron deficiency anemia, unspecified: Secondary | ICD-10-CM

## 2016-01-20 DIAGNOSIS — B181 Chronic viral hepatitis B without delta-agent: Principal | ICD-10-CM

## 2016-01-20 LAB — HX HEM-ROUTINE
HX HCT: 36.9 % (ref 32.0–45.0)
HX HGB: 11.5 g/dL (ref 11.0–15.0)
HX MCH: 27.4 pg (ref 26.0–34.0)
HX MCHC: 31.2 g/dL — ABNORMAL LOW (ref 32.0–36.0)
HX MCV: 87.9 fL (ref 80.0–98.0)
HX MPV: 9.4 fL (ref 9.1–11.7)
HX NRBC #: 0 10*3/uL
HX NUCLEATED RBC: 0 %
HX PLT: 325 10*3/uL (ref 150–400)
HX RBC BLOOD COUNT: 4.2 M/uL (ref 3.70–5.00)
HX RDW: 18.4 % — ABNORMAL HIGH (ref 11.5–14.5)
HX WBC: 5.4 10*3/uL (ref 4.0–11.0)
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
## 2016-01-21 LAB — HX CHEM-OTHER
HX % IRON SATURATION: 8 % — ABNORMAL LOW (ref 15–40)
HX FERRITIN: 11 ng/mL (ref 10–240)
HX IRON: 35 ug/dL — ABNORMAL LOW (ref 37–170)
HX TOTAL IRON BINDING CAPACITY: 448 ug/dL (ref 253–463)
HX TRANSFERRIN: 320 mg/dL (ref 181–331)

## 2016-01-25 ENCOUNTER — Ambulatory Visit: Admit: 2016-01-25 | Payer: Commercial Managed Care - HMO

## 2016-01-25 ENCOUNTER — Ambulatory Visit (HOSPITAL_BASED_OUTPATIENT_CLINIC_OR_DEPARTMENT_OTHER)

## 2016-01-25 ENCOUNTER — Ambulatory Visit

## 2016-01-25 ENCOUNTER — Ambulatory Visit: Admitting: Optometrist

## 2016-01-25 DIAGNOSIS — H5203 Hypermetropia, bilateral: Secondary | ICD-10-CM

## 2016-01-25 DIAGNOSIS — H524 Presbyopia: Principal | ICD-10-CM

## 2016-01-25 DIAGNOSIS — G43009 Migraine without aura, not intractable, without status migrainosus: Secondary | ICD-10-CM

## 2016-01-25 LAB — HX IMMUNOLOGY: HX HEPATITIS BE ANTIGEN: NONREACTIVE
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
## 2016-01-27 LAB — HX CHEM-LFT
HX ALPHA-2  MACROGLOBULIN (FIBROSURE): 112 mg/dL
HX ALT (FIBROSURE): 5 U/L — ABNORMAL LOW
HX APOLIPOPROTEIN A1 (FIBROSURE): 86 mg/dL — ABNORMAL LOW
HX FIBROSIS SCORE: 0.04
HX GGT (FIBROSURE): 16 U/L
HX HAPTOGLOBIN (FIBROSURE): 62 mg/dL
HX NECROINFLAMMAT ACT SCORE: 0.01
HX REFERENCE ID (FIBROSURE): 1770468
HX TOTAL BILIRUBIN (FIBROSURE): 0.1 mg/dL — ABNORMAL LOW

## 2016-01-28 ENCOUNTER — Ambulatory Visit

## 2016-01-28 NOTE — Progress Notes (Signed)
[facility] January 28, 2016  [address]   [place]  [place] [address]  Main: [phone]  Fax: [phone]  Patient Portal: [name]  [address]  [place], [place]  [address]                                MR#: [id number]                                         DOB:26-Apr-1970    Dear  [name],      The results of the blood tests done on 01/20/16 showed that your liver fibrosis testing suggests no fibrosis, which is good. Your anemia is better, and your iron levels are improved. Please try taking the iron tablet once daily, and follow up with Gynecology.     I would like you to see a infection specialist for your Hepatitis B, to consider treatment options. If you have not heard from my office yet, please call so we can help you schedule an Infectious Disease appointment. Please call me if you have any questions.        Sincerely,          Dr. Tonny Branch with Dr. Claiborne Rigg [facility]  [phone]    ** Please be aware of our new extended hours to better care for you. Hours are: Monday through Thursday from 8 am to 8 pm; Friday from 8 am to 5 pm; Saturday from 8 am to 12 noon.        Created By Pernell Dupre, MD on 01/28/2016 at 03:46 PM    Electronically Signed By Pernell Dupre, MD on 01/28/2016 at 03:46 PM

## 2016-02-02 ENCOUNTER — Ambulatory Visit: Admit: 2016-02-02 | Payer: Commercial Managed Care - HMO

## 2016-02-02 ENCOUNTER — Ambulatory Visit

## 2016-02-02 ENCOUNTER — Ambulatory Visit: Admitting: Surgery

## 2016-02-02 DIAGNOSIS — M549 Dorsalgia, unspecified: Secondary | ICD-10-CM

## 2016-02-02 DIAGNOSIS — K429 Umbilical hernia without obstruction or gangrene: Principal | ICD-10-CM

## 2016-02-02 NOTE — Progress Notes (Signed)
.    Progress Notes  .  Patient: [name] [name], GANGL  Provider: Reinaldo Meeker  MD  .  DOB: [birth year] Age: 46 Y Sex: Female  .  PCP: Tonny Branch  MD  Date: 02/02/2016  .  --------------------------------------------------------------------------------  .  REASON FOR APPOINTMENT  .  1. UMB HERNIA CONSLT R/S FRM 1/4JH  .  HISTORY OF PRESENT ILLNESS  .  GENERAL:  Patient is a pleasant 46 year old female  who comes today for evaluation of a moderate size umbilical  hernia. Patient states that she has had this since birth and has  gotten bigger as she is gained weight over the years. It has been  bothering her more and more recently. In addition the patient  states that she is having surgery for fibroid removal and would  like to have this fixed at the same time. Patient denies any  nausea vomiting or constant abdominal pain.  [name]  CURRENT MEDICATIONS  .  Taking Centrum - Tablet Orally  Not-Taking/PRN None  Medication List reviewed and reconciled with the patient  .  PAST MEDICAL HISTORY  .  Uterine fibroids  Obesity  Fibrocystic breast disease  Umbilical hernia  Back pain  .  ALLERGIES  .  penicillin : hives,swelling: Allergy  choroquine: hives, inbalance: Allergy  .  SURGICAL HISTORY  .  Denies Past Surgical History  .  SOCIAL HISTORY  .  .  Tobaccohistory:Never smoked.  .  Alcohol Denies.  [name]  HOSPITALIZATION/MAJOR DIAGNOSTIC PROCEDURE  .  Denies Past Hospitalization  .  REVIEW OF SYSTEMS  .  Denies fever chillsDenies any nausea or vomitingDenies constant  abdominal pain.  [name]  VITAL SIGNS  .  Pain scale 0, Ht-in 65, Wt-lbs 224.2, BMI 37.30, BP 105/68, HR  82, Temp 98.3, BSA 2.16, O2 97, Ht-cm 165.1, Wt-kg 101.7, Wt  Change 10.2 lb.  .  PHYSICAL EXAMINATION  .  Abdomen: Soft nontender obeseModerate sized reducible umbilical  hernia.  .  Assessments  .  Patient is a 46 year old female with a large reducible umbilical  hernia. Patient wishes to get this repaired the same time as her  fibroid surgery. We will try to  coordinate this with GYN.  [name]  Electronically signed by Reinaldo Meeker MD on  02/03/2016 at 03:13 PM EST  .  Document electronically signed by Reinaldo Meeker  MD  .

## 2016-02-02 NOTE — Progress Notes (Signed)
* * *        [name] [name]**    --- ---    89 Y old Female, DOB: 1970-11-22, External MRN: [id number]    Account Number: [id number]    [address], [place], [place]    Home: [phone]    Insurance: [employer] [place] Payer ID: PAPER    PCP: Tonny Branch, MD Referring: Tonny Branch, MD External Visit ID:  [id number]    [facility]        * * *    02/02/2016  Reinaldo Meeker, MD **CHN#:** [phone]    --- ---    ---        Current Medications    ---    Taking     * Centrum - Tablet Orally     ---    Not-Taking/PRN    * None     ---    * Medication List reviewed and reconciled with the patient    ---      Past Medical History    ---       Uterine fibroids.        ---    Obesity.        ---    Fibrocystic breast disease.        ---    Umbilical hernia.        ---    Back pain.        ---      Surgical History    ---      Denies Past Surgical History    ---      Social History    ---    Tobacco history: Never smoked.    Alcohol  Denies.      Allergies    ---      penicillin : hives,swelling: Allergy    ---    choroquine: hives, inbalance: Allergy    ---      Hospitalization/Major Diagnostic Procedure    ---      Denies Past Hospitalization    ---      Review of Systems    ---    Denies fever chills    Denies any nausea or vomiting    Denies constant abdominal pain.        Reason for Appointment    ---      1\. UMB HERNIA CONSLT R/S FRM 1/4JH    ---      History of Present Illness    ---     _GENERAL_ :    Patient is a pleasant 46 year old female who comes today for evaluation of a  moderate size umbilical hernia. Patient states that she has had this since  birth and has gotten bigger as she is gained weight over the years. It has  been bothering her more and more recently. In addition the patient states that  she is having surgery for fibroid removal and would like to have this fixed at  the same time. Patient denies any nausea vomiting or constant abdominal pain.       Vital Signs    ---    Pain scale 0, Ht-in 65, Wt-lbs 224.2, BMI 37.30, BP 105/68, HR 82, Temp 98.3,  BSA 2.16, O2 97, Ht-cm 165.1, Wt-kg 101.7, Wt Change 10.2 lb.      Physical Examination    ---    Abdomen: Soft nontender obese    Moderate sized reducible umbilical  hernia.      Assessments    ---    Patient is a 46 year old female with a large reducible umbilical hernia.  Patient wishes to get this repaired the same time as her fibroid surgery. We  will try to coordinate this with GYN.    ---    Electronically signed by Reinaldo Meeker MD on 02/03/2016 at 03:13 PM EST    Sign off status: Completed        * * *        [facility]    [address]    [place], [place] [address]    Tel: [phone]    Fax: [phone]              * * *          Patient: [name] [name], [name] [name] DOB: 05-Feb-1970 Progress Note: Reinaldo Meeker, MD  02/02/2016    ---    Note generated by eClinicalWorks EMR/PM Software ([email or web address])

## 2016-02-15 ENCOUNTER — Ambulatory Visit

## 2016-02-21 ENCOUNTER — Ambulatory Visit

## 2016-02-21 NOTE — Telephone Encounter (Signed)
Call Details:   Called patient to review normal Fibrosure results.  Will refer to ID re: whether or not she needs treatment of her hepatitis B.  Patient has had difficulty tolerating PO iron.  She was taking it in the morning with food, and it caused her to have a lot of nausea, no constipation, so she has stopped taking it.  Recommended she try taking it around dinner time.  Patient will give this a try.  ......................................Marland KitchenTonny Branch, MD [facility])  February 21, 2016 5:41 PM        RESPONSE/ORDERS:  [name], can you please call patient to schedule ID appointment?  She said the best time to reach her by phone is mid- morning on Thursday or any time on Friday.  Thanks!  ......................................Marland KitchenTonny Branch, MD [facility])  February 21, 2016 5:42 PM    Hi Dr. Bonnielee Haff,  Schedule the appt 03/02 for pt. Thank you!  ......................................Marland KitchenMertha Finders  February 27, 2016 4:01 PM    Thanks [name]!  ......................................Marland KitchenTonny Branch, MD [facility])  February 28, 2016 4:43 PM             ORDERS/PROBS/MEDS/ALL   New Orders:   [place] Infectious Disease [Ref-ID]    Problems:   MAMMOGRAM, ABNORMAL, BILATERAL (ICD-793.80) (ICD10-R92.8)  UTERINE FIBROIDS (ICD-218.9) (ICD10-D25.9)  IRON DEFICIENCY 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) (ICD10-D50.9)  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) (ICD10-B18.1)  OBESITY, BMI 35-39.9, ADULT (ICD-278.00) (ICD10-E66.9)  UMBILICAL HERNIA (ICD-553.1) (WGN56-O13.9)  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)  ANNUAL EXAM - SEPT 2017 (ICD-V70.0) (ICD10-Z00.00)    Allergies:   PENICILLIN (Critical)  * CHLOROQUINE (Moderate)  * INJECTAFER (Moderate)    Meds (prior to this call):   FERROUS SULFATE 325 (65 FE) MG ORAL TABLET (FERROUS SULFATE) one tablet by mouth daily          Created By Tonny Branch, MD [facility]) on 02/21/2016 at 05:31 PM    Electronically Signed By Tonny Branch, MD [facility]) on 02/28/2016 at 04:43 PM

## 2016-02-23 ENCOUNTER — Ambulatory Visit: Admit: 2016-02-23 | Payer: Commercial Managed Care - HMO

## 2016-02-23 ENCOUNTER — Ambulatory Visit

## 2016-02-23 ENCOUNTER — Ambulatory Visit: Admitting: Internal Medicine

## 2016-02-23 DIAGNOSIS — ? ESSENTIAL PRIMARY HYPERTE: Secondary | ICD-10-CM

## 2016-02-23 DIAGNOSIS — R928 Other abnormal and inconclusive findings on diagnostic imaging of breast: Principal | ICD-10-CM

## 2016-02-25 ENCOUNTER — Ambulatory Visit

## 2016-02-25 NOTE — Progress Notes (Signed)
[facility] February 25, 2016  [address]   [place]  [place] [address]  Main: [phone]  Fax: [phone]  Patient Portal: [name]  [address]  [place], [place]  [address]                                MR#: [id number]                                         DOB:1970-11-12    Dear  [name] [name],      The results of the tests performed during your visit are as follows:    Mammogram: No mammographic evidence of malignancy.  The false negative rate of mammography is higher than 10% in such dense breasts. Physical examination is of increased importance in patients with heterogeneous dense breast parenchyma. As long as patient's physical examination remains normal, annual screening mammogram is recommended.     Your mammogram shows no current evidence of cancer, though it will be important for you to keep me and your other doctors informed if you notice any new breast lumps.    Please call me if you have any questions.        Sincerely,      Tonny Branch, MD [facility]),   [place] [facility]      ** Please be aware of our new extended hours to better care for you. Hours are: Monday through Thursday from 8 am to 8 pm; Friday from 8 am to 5 pm; Saturday from 8 am to 12 noon.          Created By [name] on 02/25/2016 at 08:43 AM    Electronically Signed By Tonny Branch, MD [facility]) on 02/28/2016 at 04:46 PM

## 2016-02-27 ENCOUNTER — Ambulatory Visit

## 2016-02-27 NOTE — Progress Notes (Signed)
[facility] February 27, 2016  [address]   [place]  [place] [address]  Main: [phone]  Fax: [phone]  Patient Portal: [email or web address]  [address]  [place], [name]      Dear  Ms. EBANKS,      I am writing to let you know that the following appointments have been made for you:    Infectious Disease   Date/Time: Friday, 03/16/2016 at 9:30 am  Doctor: PERRY(JN),WHITNEY  The ID suite is located in the [employer], 3rd floor. Should you need to make any changes in your appointment, please call [phone] .    Please call me if you have any questions.        Sincerely,        [name] / Office of Tonny Branch, MD [facility]),   [facility]  [phone]                  Created By [name] on 02/27/2016 at 04:05 PM    Electronically Signed By Tonny Branch, MD [facility]) on 02/27/2016 at 05:14 PM

## 2016-03-01 ENCOUNTER — Ambulatory Visit: Admit: 2016-03-01 | Payer: Commercial Managed Care - HMO

## 2016-03-01 ENCOUNTER — Ambulatory Visit

## 2016-03-01 ENCOUNTER — Ambulatory Visit: Admitting: Obstetrics & Gynecology

## 2016-03-01 DIAGNOSIS — D251 Intramural leiomyoma of uterus: Principal | ICD-10-CM

## 2016-03-01 DIAGNOSIS — D250 Submucous leiomyoma of uterus: Secondary | ICD-10-CM

## 2016-03-01 DIAGNOSIS — D252 Subserosal leiomyoma of uterus: Secondary | ICD-10-CM

## 2016-03-01 NOTE — Progress Notes (Signed)
.  Progress Notes  .  Patient: [name] [name]  Provider: Toya Smothers  MD  .  DOB: September 03, 1970 Age: 46 Y Sex: Female  .  PCP: Tonny Branch  MD  Date: 03/01/2016  .  --------------------------------------------------------------------------------  .  REASON FOR APPOINTMENT  .  1. CONSULT-H/O FIBROIDS ENDOMETRIOSIS  .  HISTORY OF PRESENT ILLNESS  .  GENERAL:   46yo G0 here to discuss her history of fibroids and heavy  bleeding. She has known fibroids and last ultrasound in 10/2015  that showed:FINDINGS: The uterus measures 20.4 x 8.7 x 10.3 (cm  Sagittal x cm AP x cm TV) the uterus is lobulated in contour and  heterogeneous in echotexture. There are multiple uterine fibroids  with a large partially exophytic subserosal fibroid arising from  the uterine fundus measuring 11.0 x 11.1 x 4.2 cm. The  endometrium is not clearly visualized as it is obscured by the  uterine fibroids.. The right ovary measures 3.4 x 1.4 x 2.8 (cm  Sagittal x cm AP x cm TV) . The left ovary measures 2.5 x 1.9 x  3.1 (cm Sagittal x cm AP x cm TV). The ovaries are normal in  appearance. The left and right adnexal regions are unremarkable.  There is no free pelvic fluid. IMPRESSION: Heterogeneous  lobulated uterus with multiple uterine fibroids, the largest a  11.0 cm and exophytic subserosal fibroid arising from the uterine  fundus. She had been considering surgery: myomectomy vs.  hysterectomy. Her sister is an OB/GYN in [place] and has  recommended a hysterectomy. The patient is somewhat reluctant to  having the uterus out, even though she does not desire future  child bearing.Her periods last 7 days and are monthly but  accompanied by very heavy bleeding for the first 3 days requiring  bringing a change of clothes with her. She has had anemia in the  past with Hb as low as 8 but has had 2 iron transfusions with the  last Hb being 11.5. She has tried OCPs in the past, but they just  caused persistent bleeding. She has not tried other  hormonal  medication or an IUD. She has never had an EMB.  .  CURRENT MEDICATIONS  .  Taking Centrum - Tablet Orally  Taking iron 1 tab Oral  Not-Taking/PRN None  .  PAST MEDICAL HISTORY  .  Uterine fibroids  Obesity  Fibrocystic breast disease  Umbilical hernia  Back pain  .  ALLERGIES  .  penicillin : hives,swelling: Allergy  choroquine: hives, inbalance: Allergy  .  SURGICAL HISTORY  .  Denies Past Surgical History  .  FAMILY HISTORY  .  Non-Contributory  .  SOCIAL HISTORY  .  .  Tobacco  history:Never smoked  .  [name]  Alcohol  Denies  .  HOSPITALIZATION/MAJOR DIAGNOSTIC PROCEDURE  .  Denies Past Hospitalization  .  REVIEW OF SYSTEMS  .  OB/GYN ROS:  .  General    no fevers, no weakness, no memory loss, no swollen  glands, no bruising, no weight loss, no constant fatigue, no  physical abuse, no emotional abuse . HEENT    no visual problems,  no eye pain, no ear pain, no difficulty hearing, no headaches, no  sinus trouble . Skin    no changing moles, no rash, no jaundice .  Cardiovascular    no chest pains, no palpitations, no swelling,  no dizziness, no murmurs . Respiratory    no shortness of breath,  no cough, no wheezing . GI    no nausea, no vomiting, no loss of  appetite, no constipation, no diarrhea . Urinary    no painful  urination, no leakage of urine, no frequent urination, no blood  in urine . Musculoskeletal    no joint swelling, no joint  stiffness . Breast    no breast lumps, no breast pain, no nipple  discharge . Psych    no depression, no anxiety .  [name]  VITAL SIGNS  .  Pain scale 0, Ht-in 65, Wt-lbs 223.8, BMI 37.24, BP 119/79.  .  ASSESSMENTS  .  Intramural leiomyoma of uterus - D25.1 (Primary)  .  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  Subserosal leiomyoma of uterus - D25.2  .  45yo G0 here to discuss definitive management of her fibroids.  Discussed surgical options of myomectomy vs. hysterectomy.  discussed the r/b of each. In most patients with fibroids who do  not desire children, we would  recommended hysterectomy due to the  fact that we can't always remove all of the fibroids and the  increased risk of blood loss with the surgery. Sometimes we can  put the patient on Lupron prior to surgery to help with the  bleeding and shrink the size of the fibroids if a myomectomy is  desired. She is really unsure of what she wants and would like to  speak with her sister. I also would like to review the case with  one of my partners to discuss surgical approaches (laparoscopic  vs. abdominal). I will call her next week to come up with a plan.  If she chooses a hysterectomy, we will have her come in for an  EMB and preop paperwork at the same time. She would definitely  like surgery as quickly as possible.  .  FOLLOW UP  .  We will schedule going forward  .  Electronically signed by Toya Smothers MD on  03/01/2016 at 11:39 AM EST  .  Document electronically signed by Toya Smothers  MD  .

## 2016-03-01 NOTE — Progress Notes (Signed)
* * *        [name] [name]**    --- ---    46 Y old Female, DOB: 1970-11-16, External MRN: [id number]    Account Number: [id number]    [address], [place], [place]    Home: [phone]    Insurance: [employer] [place]    PCP: Tonny Branch, MD Referring: Tonny Branch, MD    Appointment Facility: Gynecology        * * *    03/01/2016  Toya Smothers, MD **CHN#:** [id number]    --- ---    ---        Reason for Appointment    ---      1\. CONSULT-H/O FIBROIDS ENDOMETRIOSIS    ---      History of Present Illness    ---     _GENERAL_ :    46yo G0 here to discuss her history of fibroids and heavy bleeding. She has  known fibroids and last ultrasound in 10/2015 that showed:    FINDINGS:    The uterus measures 20.4 x 8.7 x 10.3 (cm Sagittal x cm AP x cm TV) the uterus  is lobulated in contour and heterogeneous in echotexture. There are multiple  uterine fibroids with a large partially exophytic subserosal fibroid arising  from the uterine fundus measuring 11.0 x 11.1 x 4.2 cm. The endometrium is not  clearly visualized as it is obscured by the uterine fibroids..    The right ovary measures 3.4 x 1.4 x 2.8 (cm Sagittal x cm AP x cm TV) . The  left ovary measures 2.5 x 1.9 x 3.1 (cm Sagittal x cm AP x cm TV). The ovaries  are normal in appearance. The left and right adnexal regions are unremarkable.    There is no free pelvic fluid.    IMPRESSION:    Heterogeneous lobulated uterus with multiple uterine fibroids, the largest a  11.0 cm and exophytic subserosal fibroid arising from the uterine fundus.    She had been considering surgery: myomectomy vs. hysterectomy. Her sister is  an OB/GYN in [place] and has recommended a hysterectomy. The patient is  somewhat reluctant to having the uterus out, even though she does not desire  future child bearing.    Her periods last 7 days and are monthly but accompanied by very heavy bleeding  for the first 3 days requiring bringing a change of clothes with her. She  has  had anemia in the past with Hb as low as 8 but has had 2 iron transfusions  with the last Hb being 11.5. She has tried OCPs in the past, but they just  caused persistent bleeding. She has not tried other hormonal medication or an  IUD. She has never had an EMB.      Current Medications    ---    Taking     * Centrum - Tablet Orally     ---    * iron 1 tab Oral     ---    Not-Taking/PRN    * None     ---      Past Medical History    ---       Uterine fibroids.        ---    Obesity.        ---    Fibrocystic breast disease.        ---    Umbilical hernia.        ---  Back pain.        ---      Surgical History    ---      Denies Past Surgical History    ---      Family History    ---      Non-Contributory    ---      Social History    ---    Tobacco    history: _Never smoked_    Alcohol    _Denies_       Allergies    ---      penicillin : hives,swelling: Allergy    ---    choroquine: hives, inbalance: Allergy    ---      Hospitalization/Major Diagnostic Procedure    ---      Denies Past Hospitalization    ---      Review of Systems    ---     _OB/GYN ROS_ :    General no fevers, no weakness, no memory loss, no swollen glands, no  bruising, no weight loss, no constant fatigue, no physical abuse, no emotional  abuse. HEENT no visual problems, no eye pain, no ear pain, no difficulty  hearing, no headaches, no sinus trouble. Skin no changing moles, no rash, no  jaundice. Cardiovascular no chest pains, no palpitations, no swelling, no  dizziness, no murmurs. Respiratory no shortness of breath, no cough, no  wheezing. GI no nausea, no vomiting, no loss of appetite, no constipation, no  diarrhea. Urinary no painful urination, no leakage of urine, no frequent  urination, no blood in urine. Musculoskeletal no joint swelling, no joint  stiffness. Breast no breast lumps, no breast pain, no nipple discharge. Psych  no depression, no anxiety.          Vital Signs    ---    Pain scale 0, Ht-in 65, Wt-lbs 223.8, BMI 37.24,  BP 119/79.      Assessments    ---    1\. Intramural leiomyoma of uterus - D25.1 (Primary)    ---    2\. 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 Subserosal leiomyoma of uterus - D25.2    ---      46yo G0 here to discuss definitive management of her fibroids. Discussed  surgical options of myomectomy vs. hysterectomy. discussed the r/b of each. In  most patients with fibroids who do not desire children, we would recommended  hysterectomy due to the fact that we can't always remove all of the fibroids  and the increased risk of blood loss with the surgery. Sometimes we can put  the patient on Lupron prior to surgery to help with the bleeding and shrink  the size of the fibroids if a myomectomy is desired. She is really unsure of  what she wants and would like to speak with her sister. I also would like to  review the case with one of my partners to discuss surgical approaches  (laparoscopic vs. abdominal). I will call her next week to come up with a  plan. If she chooses a hysterectomy, we will have her come in for an EMB and  preop paperwork at the same time. She would definitely like surgery as quickly  as possible.    ---      Follow Up    ---    We will schedule going forward    Electronically signed by Toya Smothers MD on 03/01/2016 at 11:39 AM EST  Sign off status: Completed        * * *        Gynecology    [address]    [place], [place] [address]    Tel: [phone]    Fax: [phone]              * * *          Patient: [name] [name], [name] [name] DOB: 1970/12/31 Progress Note: Toya Smothers, MD  03/01/2016    ---    Note generated by eClinicalWorks EMR/PM Software ([email or web address])

## 2016-03-01 NOTE — Progress Notes (Signed)
* * *        **  [name]**    --- ---    40 Y old Female, DOB: 11-21-1970    [address], [place], [place] [address]    Home: [phone]    Provider: Toya Smothers, MD        * * *    Telephone Encounter    ---    Answered by   Patrick North  Date: 03/01/2016         Time: 11:08 AM    Reason   Needs EMB begining of March    --- ---            Message                      Patient needs an EMB in the begining of March with Dr. Renato Gails so that she can have surgery with her at the end of March. No appointments available in templates.                Action Taken   [facility] 03/01/2016 11:08:38 AM > appt. 03/15/16  with Dr. Bobetta Lime 03/02/2016 7:31:19 AM >                * * *                ---          * * *          Patient: [name] [name], [name] [name] DOB: [birth year] Provider: Toya Smothers, MD  03/01/2016    ---    Note generated by eClinicalWorks EMR/PM Software ([email or web address])

## 2016-03-02 ENCOUNTER — Ambulatory Visit

## 2016-03-07 ENCOUNTER — Ambulatory Visit: Admitting: Obstetrics & Gynecology

## 2016-03-07 NOTE — Progress Notes (Signed)
* * *      **  [name] [name]**    ------    74 Y old Female, DOB: [birth year]    [address], [place], [place] [address]    Home: [phone]    Provider: Toya Smothers, MD        * * *    Telephone Encounter    ---    Answered by  Toya Smothers Date: 03/07/2016       Time: 02:28 PM    Reason  Discuss surgery    ------            Action Taken  Toya Smothers , MD 03/07/2016 2:28:43 PM > Spoke to patient about  what she would like to do (myomectomy vs. hysterectomy) and she has elected  for hysterectomy. She has an appt on 03/15/16 for EMB and consent. She also has  an umbilical hernia for which she is followed by Dr. Reinaldo Meeker. The patient  is interested in knowing if we can coordinate the surgery. I will contact her  surgeon to work on coordination.                * * *                ---          * * *         PatientJoanne [name] DOB: 08-Sep-1970 Provider: Toya Smothers, MD  03/07/2016    ---    Note generated by eClinicalWorks EMR/PM Software ([email or web address])

## 2016-03-15 ENCOUNTER — Ambulatory Visit: Admit: 2016-03-15 | Payer: Commercial Managed Care - HMO

## 2016-03-15 ENCOUNTER — Ambulatory Visit

## 2016-03-15 ENCOUNTER — Ambulatory Visit: Admitting: Obstetrics & Gynecology

## 2016-03-15 DIAGNOSIS — N920 Excessive and frequent menstruation with regular cycle: Principal | ICD-10-CM

## 2016-03-15 DIAGNOSIS — ? HYPERLIPIDEMIA UNSPECIFIE: Secondary | ICD-10-CM

## 2016-03-15 NOTE — Progress Notes (Signed)
* * *        [name] [name]**    --- ---    46 Y old Female, DOB: [birth year], External MRN: [id number]    Account Number: [id number]    [address], [place], [id number]    Home: [phone]    Insurance: [employer] [place]    PCP: Tonny Branch, MD Referring: Tonny Branch, MD    Appointment Facility: Gynecology        * * *    03/15/2016  Progress Notes: Toya Smothers, MD **CHN#:** [id number]    --- ---    ---        Reason for Appointment    ---      1\. ENDOMETRIAL BIOPSY    ---      History of Present Illness    ---     _GENERAL_ :    75QV G0 here for EMB and consent for TAH/BS for fibroids and menorrhagia.    She discussed her options with her sister (who is an OB/GYN) and has decided  on pursuing a TAH/BS. She is also interested in using something to decrease  her bleeding until we can schedule the surgery.      Current Medications    ---    Taking     * Centrum - Tablet Orally     ---    * iron 1 tab Oral     ---    Not-Taking/PRN    * None     ---    * Medication List reviewed and reconciled with the patient    ---      Past Medical History    ---       Uterine fibroids.        ---    Obesity.        ---    Fibrocystic breast disease.        ---    Umbilical hernia.        ---    Back pain.        ---      Surgical History    ---      Denies Past Surgical History    ---      Family History    ---      Non-Contributory    ---      Social History    ---    Tobacco    history: _Never smoked_    Marital Status    _Single_    Alcohol    _Denies_       Allergies    ---      penicillin : hives,swelling: Allergy    ---    choroquine: hives, inbalance: Allergy    ---      Hospitalization/Major Diagnostic Procedure    ---      Denies Past Hospitalization    ---      Review of Systems    ---     _OB/GYN ROS_ :    General no fevers, no weakness, no memory loss, no swollen glands, no  bruising, no weight loss, no constant fatigue, no physical abuse, no emotional  abuse. HEENT no visual problems, no eye pain,  no ear pain, no difficulty  hearing, no headaches, no sinus trouble. Skin no changing moles, no rash, no  jaundice. Cardiovascular no chest pains, no palpitations, no swelling, no  dizziness, no murmurs. Respiratory no shortness of breath, no cough, no  wheezing. GI no nausea, no vomiting, no loss of appetite, no constipation, no  diarrhea. Urinary no painful urination, no leakage of urine, no frequent  urination, no blood in urine. Musculoskeletal no joint swelling, no joint  stiffness. Breast no breast lumps, no breast pain, no nipple discharge. Psych  no depression, no anxiety.          Vital Signs    ---    Pain scale 0, Ht-in 65, Wt-lbs 224.4, BMI 37.34, BP 110/60, Wt-kg 101.79.      Physical Examination    ---     _Endometrial Biopsy_ :    Procedure: Indications for procedure explained. Written and verbal informed  consent obtained. Speculum placed and cervix visualized. Cervix swabbed with  betadine. Endometrial pipelle placed through cervicalos, into uterus.    Single toothed tenaculum placed on the anterior lip of the cervix: Yes.    Uterus sounded to: >12cm.    Number of passes made with pipelle: 1.    Amount of tissue obtained: moderate.    The patient tolerated the procedure: well.    _OB/Gyn General_ :    General Appearance: well-appearing, no acute distress.    Mental Status: alert and oriented.    Mood/Affect: pleasant.    _OB/Gyn Genitourinary_ :    External Genitalia: normal.    Vagina: normal, pink/moist mucosa, no lesions, no abnormal discharge.    Cervix: normal appearance, no lesions/discharge/bleeding, no cervical motion  tenderness.    Uterus: unable to palpate on bimanual exam.    Adnexa: non-palpable.    Urethra Normal.    Bladder Normal .          Assessments    ---    1\. Menorrhagia with regular cycle - N92.0 (Primary)    ---      46yo G0 here for EMB and consent for surgery.    --EMB performed with the assistance of Dr. Evelena Asa    --Consent for TAH/BS signed. Surgery to be scheduled  05/10/16 in the AM.  Surgery to be scheduled in collaboration with Dr. Sherryll Burger for hernia repair.    --will work on approval for Lupron 3 month depo shot    --Will call patient with pathology results.    ---        Labs    --- ---    _Lab: Surgical (Pathology)_    ---       Procedure Codes    ---      [phone] BIOPSY endometrial, +/- ECC    ---    [phone] URINE PREGNANCY TEST    ---      Follow Up    ---    after surgery    Electronically signed by Toya Smothers MD on 03/15/2016 at 04:38 PM EST    Sign off status: Completed        * * *        Gynecology    [address]    [place], [place] [address]    Tel: [phone]    Fax: [phone]              * * *          Patient: [name] [name], [name] [name] DOB: Feb 08, 1970 Progress Note: Toya Smothers, MD  03/15/2016    ---    Note generated by eClinicalWorks EMR/PM Software ([email or web address])

## 2016-03-15 NOTE — Progress Notes (Signed)
.  Progress Notes  .  Patient: [name] [name]  Provider: Toya Smothers  MD  .  DOB: 29-Mar-1970 Age: 46 Y Sex: Female  .  PCP: Tonny Branch  MD  Date: 03/15/2016  .  --------------------------------------------------------------------------------  .  REASON FOR APPOINTMENT  .  1. ENDOMETRIAL BIOPSY  .  HISTORY OF PRESENT ILLNESS  .  GENERAL:   46yo G0 here for EMB and consent for TAH/BS for fibroids and  menorrhagia.She discussed her options with her sister (who is an  OB/GYN) and has decided on pursuing a TAH/BS. She is also  interested in using something to decrease her bleeding until we  can schedule the surgery.  .  CURRENT MEDICATIONS  .  Taking Centrum - Tablet Orally  Taking iron 1 tab Oral  Not-Taking/PRN None  Medication List reviewed and reconciled with the patient  .  PAST MEDICAL HISTORY  .  Uterine fibroids  Obesity  Fibrocystic breast disease  Umbilical hernia  Back pain  .  ALLERGIES  .  penicillin : hives,swelling: Allergy  choroquine: hives, inbalance: Allergy  .  SURGICAL HISTORY  .  Denies Past Surgical History  .  FAMILY HISTORY  .  Non-Contributory  .  SOCIAL HISTORY  .  .  Tobacco  history:Never smoked  .  [name]  Marital Status  Single  .  [name]  Alcohol  Denies  .  HOSPITALIZATION/MAJOR DIAGNOSTIC PROCEDURE  .  Denies Past Hospitalization  .  REVIEW OF SYSTEMS  .  OB/GYN ROS:  .  General    no fevers, no weakness, no memory loss, no swollen  glands, no bruising, no weight loss, no constant fatigue, no  physical abuse, no emotional abuse . HEENT    no visual problems,  no eye pain, no ear pain, no difficulty hearing, no headaches, no  sinus trouble . Skin    no changing moles, no rash, no jaundice .  Cardiovascular    no chest pains, no palpitations, no swelling,  no dizziness, no murmurs . Respiratory    no shortness of breath,  no cough, no wheezing . GI    no nausea, no vomiting, no loss of  appetite, no constipation, no diarrhea . Urinary    no painful  urination, no leakage of urine, no  frequent urination, no blood  in urine . Musculoskeletal    no joint swelling, no joint  stiffness . Breast    no breast lumps, no breast pain, no nipple  discharge . Psych    no depression, no anxiety .  [name]  VITAL SIGNS  .  Pain scale 0, Ht-in 65, Wt-lbs 224.4, BMI 37.34, BP 110/60, Wt-kg  101.79.  [name]  PHYSICAL EXAMINATION  .  Endometrial Biopsy:  Procedure:  Indications for procedure explained. Written and  verbal informed consent obtained. Speculum placed and cervix  visualized. Cervix swabbed with betadine. Endometrial pipelle  placed through cervicalos, into uterus.  Single toothed tenaculum placed on the anterior lip of the  cervix:  Yes.  Uterus sounded to:  >12cm.  Number of passes made with pipelle:  1.  Amount of tissue obtained:  moderate.  The patient tolerated the procedure:  well.  OB/Gyn General:  General Appearance:  well-appearing, no acute distress.  Mental Status:  alert and oriented.  Mood/Affect:  pleasant.  OB/Gyn Genitourinary:  External Genitalia:  normal.  Vagina:  normal, pink/moist mucosa, no lesions, no abnormal  discharge.  Cervix:  normal appearance, no lesions/discharge/bleeding, no  cervical motion tenderness.  Uterus:  unable to palpate on bimanual exam.  Adnexa:  non-palpable.  Urethra   Normal.  Bladder  Normal .  .  ASSESSMENTS  .  Menorrhagia with regular cycle - N92.0 (Primary)  .  46yo G0 here for EMB and consent for surgery.--EMB performed with  the assistance of Dr. Coralyn Helling for TAH/BS signed. Surgery  to be scheduled 05/10/16 in the AM. Surgery to be scheduled in  collaboration with Dr. Sherryll Burger for hernia repair.--will work on  approval for Lupron 3 month depo shot--Will call patient with  pathology results.  [name]  LABS  .   LAB: Surgical (Pathology)  .  [name]  PROCEDURE CODES  .  58100 BIOPSY endometrial, +/- ECC  .  81025 URINE PREGNANCY TEST  .  FOLLOW UP  .  after surgery  .  Electronically signed by Toya Smothers MD on  03/15/2016 at 04:38 PM EST  .  Document electronically  signed by Toya Smothers  MD  .

## 2016-03-16 ENCOUNTER — Ambulatory Visit

## 2016-03-20 ENCOUNTER — Ambulatory Visit: Admitting: Obstetrics & Gynecology

## 2016-03-20 NOTE — Progress Notes (Signed)
* * *        **  [name] [name]**    --- ---    83 Y old Female, DOB: Nov 09, 1970    [address], [place], [place] [address]    Home: [phone]    Provider: Toya Smothers, MD        * * *    Telephone Encounter    ---    Answered by   Montine Circle  Date: 03/20/2016         Time: 03:12 PM    Reason   RX PA APPROVED: LUPRON DEPOT 22.5MG  EXP 06/22/16    --- ---            Message                      Verbal for Lupron 3 month injection called into [facility].                 Action Taken   McGunigle,Julia A 03/20/2016 3:15:39 PM > Verbal for Lupron 3  month injection called into [facility]. McGunigle,Julia A 03/21/2016  9:16:20 AM > Confirmed with [facility], PA submitted to PA dept. rx was  [id number] option9, updated clinic note with addendum JmcgunigleRN Clark,Karen  03/22/2016 3:43:51 PM > approved. RX prior authorization has been approved for  Lupron Depot 22.5mg  kit per [facility] [phone]. Effective dates are  03/22/16 to 06/22/16. PA # [id number]. Approval letter has been scanned into [facility]  Patient Docs under Misc. Medication may be dispensed from [facility].  McGunigle,Julia A 03/23/2016 8:52:11 AM > MD aware, LM for pt to call back and  schedule [facility] appointment and pick up medication Jmcgunigle RN            Refills  Start Leuprolide Acetate (3 Month) Kit, 22.5 mg, Intramuscular, 1, as  directed, every 12 weeks, 90 days, Refills=0    --- ---          * * *                ---          * * *          PatientJoanne [name] DOB: 11/29/1970 Provider: Toya Smothers, MD  03/20/2016    ---    Note generated by eClinicalWorks EMR/PM Software ([email or web address])

## 2016-03-26 ENCOUNTER — Ambulatory Visit

## 2016-03-27 LAB — HX SURGICAL

## 2016-03-27 LAB — HX COLONOSCOPY

## 2016-04-06 ENCOUNTER — Ambulatory Visit

## 2016-05-04 ENCOUNTER — Ambulatory Visit: Admitting: Infectious Disease

## 2016-05-04 NOTE — Progress Notes (Signed)
* * *        **  [name]**    --- ---    55 Y old Female, DOB: 09-08-1970    [address], [place], [place] [address]    Home: [phone]    Provider: Rhunette Croft, MD        * * *    Telephone Encounter    ---    Answered by   Rhunette Croft  Date: 05/04/2016         Time: 10:08 AM    Reason   REscedule    --- ---            Message                      Dear clinic,      Please reschedule this pt.       Thanks, Bascom Biel                Action Taken   [employer] , MD 05/04/2016 10:09:45 AM > Muir,Tiffany L  05/04/2016 10:16:02 AM > rescheduled for 05/04 at 10:30, left detailed message  and mailed appt card <ok, tnx Jon Kasparek                * * *                ---          * * *          Patient: [name] [name], [name] [name] DOB: Mar 08, 1970 Provider: Rhunette Croft, MD  05/04/2016    ---    Note generated by eClinicalWorks EMR/PM Software ([email or web address])

## 2016-05-10 ENCOUNTER — Inpatient Hospital Stay
Admission: RE | Admit: 2016-05-10 | Discharge: 2016-05-13 | Disposition: A | Discharge status: Home Health | Payer: Commercial Managed Care - HMO

## 2016-05-10 ENCOUNTER — Inpatient Hospital Stay
Admit: 2016-05-10 | Disposition: A | Discharge status: Home Health | Source: Ambulatory Visit | Attending: Obstetrics & Gynecology | Admitting: Obstetrics & Gynecology

## 2016-05-10 ENCOUNTER — Ambulatory Visit

## 2016-05-10 LAB — HX HEM-ROUTINE
HX BASO #: 0 10*3/uL (ref 0.0–0.2)
HX BASO: 0 %
HX EOSIN #: 0.1 10*3/uL (ref 0.0–0.5)
HX EOSIN: 3 %
HX HCT: 30.5 % — ABNORMAL LOW (ref 32.0–45.0)
HX HCT: 31.5 % — ABNORMAL LOW (ref 32.0–45.0)
HX HGB: 9.1 g/dL — ABNORMAL LOW (ref 11.0–15.0)
HX HGB: 9.8 g/dL — ABNORMAL LOW (ref 11.0–15.0)
HX IMMATURE GRANULOCYTE#: 0 10*3/uL (ref 0.0–0.1)
HX IMMATURE GRANULOCYTE: 0 %
HX LYMPH #: 1.7 10*3/uL (ref 1.0–4.0)
HX LYMPH: 35 %
HX MCH: 23 pg — ABNORMAL LOW (ref 26.0–34.0)
HX MCH: 24.1 pg — ABNORMAL LOW (ref 26.0–34.0)
HX MCHC: 29.8 g/dL — ABNORMAL LOW (ref 32.0–36.0)
HX MCHC: 31.1 g/dL — ABNORMAL LOW (ref 32.0–36.0)
HX MCV: 77 fL — ABNORMAL LOW (ref 80.0–98.0)
HX MCV: 77.4 fL — ABNORMAL LOW (ref 80.0–98.0)
HX MONO #: 0.4 10*3/uL (ref 0.2–0.8)
HX MONO: 8 %
HX MPV: 10.1 fL (ref 9.1–11.7)
HX MPV: 9.6 fL (ref 9.1–11.7)
HX NEUT #: 2.6 10*3/uL (ref 1.5–7.5)
HX NRBC #: 0 10*3/uL
HX NUCLEATED RBC: 0 %
HX PLT: 339 10*3/uL (ref 150–400)
HX PLT: 240 10*3/uL (ref 150–400)
HX RBC BLOOD COUNT: 3.96 M/uL (ref 3.70–5.00)
HX RBC BLOOD COUNT: 4.07 M/uL (ref 3.70–5.00)
HX RDW: 17.2 % — ABNORMAL HIGH (ref 11.5–14.5)
HX RDW: 15.9 % — ABNORMAL HIGH (ref 11.5–14.5)
HX SEG NEUT: 53 %
HX WBC: 4.8 10*3/uL (ref 4.0–11.0)
HX WBC: 12.5 10*3/uL — ABNORMAL HIGH (ref 4.0–11.0)

## 2016-05-10 LAB — HX TRANSFUSION
HX ABO RH CONFIRMATORY TYPE, MANUAL: O POS
HX ABO-RH INTERPRETATION (GEL): O POS
HX ANTIBODY SCREEN (GEL): NEGATIVE

## 2016-05-10 LAB — HX BF-URINALYSIS: HX URINE PREGNANCY TEST (HCG QUAL): NEGATIVE

## 2016-05-10 NOTE — Op Note (Signed)
Patient    [name] [name], [name] [name]       Med Rec #:  [id number]  Name:  Operation  05/10/2016                Pt.  Dt:                                  Location:  .  [name]                               OPERATIVE REPORT  .  [name]  PREOPERATIVE DIAGNOSIS:  Uterine fibroids (20-week size).  .  POSTOPERATIVE DIAGNOSES:  1.  Uterine fibroids (20-week size).  2.  Dense pelvic adhesions.  [name]  PROCEDURES:  1.  Intraoperative consultation.  2.  Enterolysis (extensive greater than 30 minutes).  3.  Bilateral ureterolysis.  .  SURGEON:  Mayra Neer, M.D.  .  ASSISTANTS:  Toya Smothers, M.D., Linna Caprice, M.D. and Cecil Cranker,  M.D.  .  ESTIMATED BLOOD LOSS:  100 mL.  [name]  COMPLICATIONS:  None.  .  ANESTHESIA:  GETA.  [name]  ANTIBIOTIC PROPHYLAXIS:  Per Dr. Renato Gails  THROMBOEMBOLIC PROPHYLAXIS: Per Dr. Renato Gails  .  SPECIMENS:  None  .  INDICATIONS FOR THE PROCEDURE:  I was called intraoperatively by Dr. Renato Gails  to assist with the procedure given the unanticipated level of complexity, I  arrived within minutes and scrubbed into the operation.  [name]  FINDINGS:  A large 20+ week size uterus that was densely adherent  posteriorly to the rectosigmoid and bilateral pelvic sidewalls.  [name]  PROCEDURE IN DETAIL:  I scrubbed into the operation and quickly assessed  that the Bookwalter retractor was in excellent position, but did not  provide adequate exposure.  I disassembled it and with the help of the 2  assistants opened both pelvic sidewalls to identify the ureters.  These  were placed on Penrose drains for traction and dissected distally into the  paracervical tunnels.  The posterior uterus was identified and blunt and  sharp dissection was performed over the next 30 minutes to detach it from  the underlying rectosigmoid.  .  Both fallopian tubes had been excised, but the left ovary was fused into  the posterior fossa.  This was dissected away from the rectosigmoid,  mobilized and placed within the upper abdomen.  The right ovary had  similarly been mobilized  into the upper abdomen.  I dissected the ureters  out and facilitated the additional enterolysis to mobilize the uterus such  that clamps could be placed on the uterine vessels with the ureters well  lateralized.  I spent one hour performing enterolysis and ureterolysis as  described and then stepped aside while Dr. Renato Gails and her assistant secured  the uterine vessels and performed an amputation to complete the  supracervical hysterectomy with the intent of continuing and removing the  entirety of the cervix.  I scrubbed out at that point as Dr. Renato Gails and her  assistant felt comfortable proceeding.  I did not communicate with the  family.  The patient remained intubated and no sponge, instrument counts  had been completed at the time of my departure.  .  .  .  Electronically Signed  Mayra Neer, MD 05/14/2016 07:30 A  .  [name]  Dictated by: Mayra Neer, MD  .  D:    05/11/2016  T:    05/11/2016 07:42 P  Dictation ID:  [id number]/Doc#  [id number]  .  cc:   Toya Smothers, MD        [facility]        [address]        [place] [address]  .  [name]      Document is preliminary until electronically or manually signed by                             attending physician.

## 2016-05-10 NOTE — Op Note (Signed)
Patient    [name] [name], [name] [name]       Med Rec #:  [id number]  Name:  Operation  05/10/2016                Pt.  Dt:                                  Location:  .  [name]                               OPERATIVE REPORT  .  [name]  PREOPERATIVE DIAGNOSIS:  Fibroids, menorrhagia and umbilical hernia.  [name]  POSTOPERATIVE DIAGNOSIS:  Fibroids, menorrhagia and umbilical hernia.  [name]  PROCEDURE:  Total abdominal hysterectomy, bilateral salpingectomy,  extensive lysis of adhesions, removal of umbilical hernia sac (by General  Surgery).  .  ANESTHESIA:  General.  .  SURGEON:  Toya Smothers, M.D.  .  INTRAOPERATIVE CONSULTANT:  Mayra Neer, M.D.  .  ASSISTANTS:  1.  Merwyn Katos, M.D.  2.  Shirlee Latch, M.D.  3.  Linna Caprice, M.D., PGY4.  4.  Cecil Cranker, M.D., PGY1.  [name]  FINDINGS:  A 20-week sized multifibroid uterus with 8 cm anterior lower  uterine segment fibroid and multiple other large fibroids.  Dense adhesions  of the sigmoid and the left ovary to the posterior uterus.  Normal-appearing ovaries and fallopian tubes bilaterally. 5x5 cm umbilical  hernia.  [name]  ESTIMATED BLOOD LOSS:  1 L.  .  INTRAVENOUS FLUIDS:  3200 mL.  [name]  BLOOD REPLACEMENT 1 unit packed red blood cells.  [name]  URINE OUTPUT:  285 mL clear urine at the end of the procedure.  [name]  PATHOLOGY:  Uterus, cervix, bilateral fallopian tubes.  .  COMPLICATIONS:  None.  .  INDICATIONS:  The patient is a 46 year old G0 with a history of fibroids,  menorrhagia, and dysmenorrhea who has failed medical management and  presents for surgical treatment.  Given the size of her uterus, the patient  was consented for total abdominal hysterectomy and bilateral salpingectomy.  The patient also was found to have a 5 x 5 cm umbilical hernia and this was  consented for a concomitant umbilical hernia repair with General Surgery.  .  DESCRIPTION OF PROCEDURE:  The patient was taken to the operating room  where SCD boots were placed.  The patient was given subcutaneous heparin,  gentamicin  and clindamycin for perioperative prophylaxis.  General  anesthesia was obtained.  The patient was prepped and draped in the usual  sterile fashion.  A Foley catheter was placed.  A surgical timeout was  performed.  A vertical midline skin incision was made extending from the  umbilicus to the pubic symphysis.  The incision was carried down to the  fascia using the Bovie and the fascia was incised sharply in the midline.  The fascial incision was extended superiorly and inferiorly.  The  peritoneum was identified and entered sharply.  The peritoneal incision was  extended superiorly and inferiorly with good visualization of the bladder.  At this point, an attempt was made to deliver the uterus through the  incision; however, given the size of the uterus.  This was not possible and  thus the skin and fascial and peritoneal incisions were all extended around  the umbilicus.  Again, an attempt was made to deliver the uterus through  the incision, however, an 8 cm fibroid was noted in the anterior lower  uterine segment, which made it impossible to deliver the uterus and obtain  adequate visualization of the bilateral adnexa.  Given this, the decision  was made to proceed with a myomectomy; 20 mL of dilute vasopressin was  injected over the lower uterine segment fibroids and good blanching was  noted.  A horizontal incision was made with the Bovie and carried through  to the fibroid.  The capsule was identified and using a combination of  sharp and blunt dissection and traction, the fibroid was excised.  The  incision was then closed with a running lock stitch of 0 Vicryl.  After the  fibroid was removed, uterus was then delivered to the incision.  The right  round ligament was identified, suture ligated, and divided.  The anterior  leaf of the broad ligament on the right was then incised to create a  bladder flap by carrying the incision to the vesicouterine peritoneal  reflection.  The pelvic peritoneal sidewall was  incised on the right  parallel to the external iliac vessels and an attempt was made to develop  the pararectal space; however, visualization of the ureter was quite  difficult.  The right ureter was palpated and identified.  A right  salpingectomy was then performed by clamping across the right fallopian  tube, dividing it and suture ligating it.  The right ovary was noted to be  located quite close to the uterus and great care was taken to clamp,  divide, and suture ligate the right utero-ovarian ligament.  Attention was  then turned to the left adnexa.  The left round ligament was identified,  clamped and suture ligated and divided.  The anterior leaf of the broad  ligament was incised across the lower uterine segment to complete the  bladder flap.  The pelvic peritoneal sidewall was then incised parallel to  the external iliac vessels and again an attempt was made to develop the  pararectal space.  At this point, the left ovary was noted to be densely  adherent to the posterior lower uterine segment and also densely adherent  to the sigmoid colon.  At this time, an intraoperative consult was called  to Dr. Mayra Neer, who came in and performed a lysis of adhesions and  also a bilateral ureterolysis, which will be dictated separately.  Please  see the separate operative note.  After the lysis of adhesions and the  bilateral uterolysis to identify the right and left ureters, the left  fallopian tube was clamped, divided, and suture ligated to perform a left  salpingectomy.  The left uteroovarian ligament was then clamped, divided,  and suture ligated.  The posterior peritoneum was taken down on the left  and the right side in order to skeletonize the uterine vessels.  The  uterine vascular pedicles were then clamped bilaterally and given the size  of the uterus, the decision was made to amputate the uterus above the  uterine artery clamps in order to obtain better visualization.  This was  performed with the  Bovie.  After the uterus was removed, the ureters were  once again identified and noted to be outside of the clamps on the  bilateral uterine vessels, the uterine vessels were then ligated with 0  Vicryl.  The cardinal ligaments were clamped, divided, and ligated with a  series of 0 Vicryl sutures down to the level of the cervicovaginal  junction.  The  proximal vagina was crossclamped with curved Heaney clamps  after ensuring that the bladder was safely dissected free from the upper  vagina and the cervix excised.  The vaginal cuff was closed with a running  suture followed by a running locked suture.  Good hemostasis was noted.  The pelvis was irrigated with warm saline and again good hemostasis was  noted at the cuff.  The ovarian pedicles were then reexamined and there was  a small amount of oozing noted from the left ovary.  A free tie of 0 Vicryl  was used to obtain excellent hemostasis.  .  At this point, General Surgery was called in to assist with the umbilical  hernia repair.  Given that the umbilical hernia sac had already been  entered, they then excised the umbilical hernia sac.  Please see their  separate operative note for full details.  Per the General Surgery team,  the fascia was then closed with #1 PDS in a running fashion.  The  subcutaneous fat was closed with 3-0 plain in a running fashion.  The skin  was closed with staples.  The patient tolerated this procedure well.  She  was transfused with 1 unit of packed red blood cells during the procedure  given that her blood loss was 1 liter and her starting hematocrit was 30.  Sponge, lap and needle counts were correct x2.  The patient was taken to  recovery room in stable condition.  .  .  .  Electronically Signed  Toya Smothers, MD 05/14/2016 08:56 A  .  [name]  Dictated by: Linna Caprice, M.D  .  D:    05/10/2016  T:    05/10/2016 06:45 P  Dictation ID:  [id number]/Doc#  [id number]  .  cc:  .  [name]      Document is preliminary until electronically or  manually signed by                             attending physician.

## 2016-05-11 LAB — HX HEM-ROUTINE
HX BASO #: 0 10*3/uL (ref 0.0–0.2)
HX BASO: 0 %
HX EOSIN #: 0 10*3/uL (ref 0.0–0.5)
HX EOSIN: 0 %
HX HCT: 27.9 % — ABNORMAL LOW (ref 32.0–45.0)
HX HGB: 8.9 g/dL — ABNORMAL LOW (ref 11.0–15.0)
HX IMMATURE GRANULOCYTE#: 0 10*3/uL (ref 0.0–0.1)
HX IMMATURE GRANULOCYTE: 0 %
HX LYMPH #: 1.3 10*3/uL (ref 1.0–4.0)
HX LYMPH: 12 %
HX MCH: 24.5 pg — ABNORMAL LOW (ref 26.0–34.0)
HX MCHC: 31.9 g/dL — ABNORMAL LOW (ref 32.0–36.0)
HX MCV: 76.6 fL — ABNORMAL LOW (ref 80.0–98.0)
HX MONO #: 1.1 10*3/uL — ABNORMAL HIGH (ref 0.2–0.8)
HX MONO: 11 %
HX MPV: 10.3 fL (ref 9.1–11.7)
HX NEUT #: 8.4 10*3/uL — ABNORMAL HIGH (ref 1.5–7.5)
HX PLT: 262 10*3/uL (ref 150–400)
HX RBC BLOOD COUNT: 3.64 M/uL — ABNORMAL LOW (ref 3.70–5.00)
HX RBC MORPHOLOGY: ABNORMAL — AB
HX RDW: 16.3 % — ABNORMAL HIGH (ref 11.5–14.5)
HX SEG NEUT: 77 %
HX WBC: 10.8 10*3/uL (ref 4.0–11.0)

## 2016-05-13 ENCOUNTER — Ambulatory Visit

## 2016-05-13 DIAGNOSIS — K429 Umbilical hernia without obstruction or gangrene: Secondary | ICD-10-CM

## 2016-05-13 DIAGNOSIS — D259 Leiomyoma of uterus, unspecified: Principal | ICD-10-CM

## 2016-05-13 DIAGNOSIS — D649 Anemia, unspecified: Secondary | ICD-10-CM

## 2016-05-13 DIAGNOSIS — N920 Excessive and frequent menstruation with regular cycle: Secondary | ICD-10-CM

## 2016-05-13 NOTE — Discharge Summary (Signed)
Patient Name: [name] [name]            MRN: [id number]  DOB: 1970/02/02  Admit Date: 05/10/2016   8:41:00AM  Atn Physician: Toya Smothers MD  Age: 69Y  Discharge Medication List:  Allergies: chloroquine, Penicillins  UserDefinedString2: [employer]  NEWMEDLIST  This is your current medication list; please discard all old medication lists.  Carry this with you at all times including emergency situations and take this to all doctor's appointments.  Please review this list carefully for changes in drugs, doses or frequency with which they are taken and remember to update this list when medications are changed.                                                                                                                                                                                         Take ONLY the medications listed on this form                                                                                                                                                                                                                    docusate sodium (DOK) 100 mg Capsule, 1 capsule oral twice a day    Additional Instructions:  Entered AV:WUJWJXB  Denton Lank, MD                                                                                                                                                          ---------------------------------------------------------------------------------------------------------------------------------------------------------------------------------------  ferrous sulfate   325 mg (65 mg iron) Tablet, 1 tablet oral daily    Additional Instructions:  Entered YN:WGNFAOZ  Denton Lank, MD                                                                                                                                                           ---------------------------------------------------------------------------------------------------------------------------------------------------------------------------------------                                                                                                                                                          ibuprofen   600 mg Tablet, 1 tablet oral every six hours as needed for pain  Additional Instructions:  Entered HY:QMVHQIO  Denton Lank, MD                                                                                                                                                          ---------------------------------------------------------------------------------------------------------------------------------------------------------------------------------------  oxyCODONE   5 mg Tablet, 1 tablet oral every four hours as needed for PAIN SCORE 4-6  Additional Instructions:  Entered AV:WUJWJXB  Denton Lank, MD                                                                                                                                                          ---------------------------------------------------------------------------------------------------------------------------------------------------------------------------------------                                                                                                                                                          oxyCODONE   5 mg Tablet, 2 tablet oral every four hours as needed for PAIN SCORE 7-10  Additional Instructions:  Entered JY:NWGNFAO  Denton Lank, MD                                                                                                                                                           ---------------------------------------------------------------------------------------------------------------------------------------------------------------------------------------  Documents reviewed with Family   /   Healthcare Proxy - Agent   /   Guardian       (circle role)  Discharge Nurse: _______________________________  Name of Unit: PG5N____________________ Phone:  [phone]- ______________  Date: ______________________________ Time:________________________  Electronically signed by: Marion Downer, MD             Pt Name: [name] [name]  MRN: [id number]    Created By  LinkLogic on 05/13/2016 at 10:07 AM    Electronically Signed By Tonny Branch, MD (KH) on 05/21/2016 at 01:20 PM

## 2016-05-14 ENCOUNTER — Ambulatory Visit: Admitting: Obstetrics & Gynecology

## 2016-05-14 LAB — HX COLONOSCOPY

## 2016-05-14 LAB — HX TRANSFUSED PRODUCTS
HX RED CELL PRODUCT: TRANSFUSED
HX UNIT RH: POSITIVE
HX UNIT STATUS: TRANSFUSED
HX UNIT VOLUME: 300

## 2016-05-14 LAB — HX SURGICAL

## 2016-05-14 NOTE — Progress Notes (Signed)
* * *        **  [name] [name]**    --- ---    36 Y old Female, DOB: August 03, 1970    [address], [place], [place] [address]    Home: [phone]    Provider: Jaquita Folds, MD        * * *    Telephone Encounter    ---    Answered by   Cecil Cranker R  Date: 05/14/2016         Time: 11:32 AM    Caller   [facility]/pt    --- ---            Reason   Pain control, serous drainage            Message                      [facility] called to report pt having moderate amount of serous discharge from the lower 10cm of her incision and peri-umbilically. Also reports pt having significant pain - did not pick up her prescription for oxycodone at the pharmacy yet. Utilizing tylenol and aleve. Pain now 6/10. No bloody discharge. Pt not having fevers.                 Action Taken   Jaquita Folds, MD 05/14/2016 11:32:51 AM > Called patient  and discussed that we would like to evaluate her today or tomorrow for the  drainage. Discussed if she could have a neighbor pick up her pain medicaiton -  they are all currently at work. Discussed possiblity of seeing her today given  her poor pain control but patient stated she would prefer to rest today and  come with her sister. Will call to check in with her this afternoon. Advised  that if she develops a fever she must come to the ED to be evaluated, even if  she needs to call an ambulance. She agreed with that plan.                * * *                ---          * * *          Patient: [name], [name] DOB: 14-Aug-1970 Provider: Jaquita Folds,  MD 05/14/2016    ---    Note generated by eClinicalWorks EMR/PM Software ([email or web address])

## 2016-05-14 NOTE — Progress Notes (Signed)
* * *        **  [name] [name]**    --- ---    42 Y old Female, DOB: 10/10/1970    [address], [place], [place] [address]    Home: [phone]    Provider: Toya Smothers, MD        * * *    Telephone Encounter    ---    Answered by   Patrick North  Date: 05/14/2016         Time: 09:49 AM    Reason   Admitted/draining    --- ---            Message                      Hassell Done fromt he ED called about this patient who is getting admitted saying that the patient is missing RX's and cyst is draining, her number is [phone].                 Action Taken   [facility] 05/14/2016 9:52:05 AM > McGunigle,Julia A  05/14/2016 10:14:38 AM > forward to gabby, contacted [facility], confirmed no  patient by that name in ED or being admitted according to [facility] pt d/c 4/29,  if patient has questions about rx need to call floor Jmcgunigle RN  [facility] 05/14/2016 10:17:13 AM > Spoke to Rico and patient and  told them to contact the ED where she was seen and given the RXs, I transfered  her to the ED, they expressed understanding.                * * *                ---          * * *          PatientJoanne [name] DOB: Jul 08, 1970 Provider: Toya Smothers, MD  05/14/2016    ---    Note generated by eClinicalWorks EMR/PM Software ([email or web address])

## 2016-05-16 ENCOUNTER — Ambulatory Visit: Admit: 2016-05-16 | Payer: Commercial Managed Care - HMO

## 2016-05-16 ENCOUNTER — Ambulatory Visit: Admitting: Obstetrics & Gynecology

## 2016-05-16 DIAGNOSIS — ? NICOTINE DEPEND UNS UNCOM: Secondary | ICD-10-CM

## 2016-05-16 DIAGNOSIS — L7634 Postprocedural seroma of skin and subcutaneous tissue following other procedure: Principal | ICD-10-CM

## 2016-05-16 NOTE — Progress Notes (Signed)
.  Progress Notes  .  Patient: [name] [name]  Provider: Jaquita Folds MD.  DOB: 09-06-1970 Age: 46 Y Sex: Female  Supervising Provider:: Merwyn Katos, MD  Date: 05/16/2016  .  PCP: [name]    Date: 05/16/2016  .  --------------------------------------------------------------------------------  .  REASON FOR APPOINTMENT  .  1. EVALUATION-PER LUDGIN  .  HISTORY OF PRESENT ILLNESS  .  GENERAL:   Patient presenting for evaluation after having serous discharge  from the bottom part of her surgical wound two days ago and poor  pain control. Patient is POD 6 after TAH-BS, umbilical hernia  repair.Re discharge: pt states two days ago she had a lot of  fluid discharge, but over yesterday and today it has been minimal  with just small stains on her abdominal binder. Re pain: States  she has some pain on the dependent most portion of the incision  and feels like the staples are "biting her skin". Otherwise pain  well controlled. States she is taking aleve, motrin and tylenol  with occasional oxycodone use. Denies cp, palpitations, n/v,  fevers. Was sweaty when she woke up this morning but not sure if  that was due to the temperature change.  .  CURRENT MEDICATIONS  .  Taking Centrum - Tablet Orally  Taking iron 1 tab Oral  Taking Leuprolide Acetate (3 Month) 22.5 mg Kit as directed  Intramuscular every 12 weeks  Not-Taking/PRN None  .  PAST MEDICAL HISTORY  .  Uterine fibroids  Obesity  Fibrocystic breast disease  Umbilical hernia  Back pain  .  ALLERGIES  .  penicillin : hives,swelling: Allergy  choroquine: hives, inbalance: Allergy  .  SURGICAL HISTORY  .  Total abdominal hysterectomy, bilateral salpingectomy, umbilical  hernia repair 04/2016  .  FAMILY HISTORY  .  Non-Contributory  .  SOCIAL HISTORY  .  .  Tobacco  history:Never smoked  .  [name]  Marital Status  Single  .  [name]  Alcohol  Denies  .  REVIEW OF SYSTEMS  .  OB/GYN ROS:  .  General    no fevers, no weakness, no memory loss, no swollen  glands, no  bruising, no weight loss, no constant fatigue, no  physical abuse, no emotional abuse . Cardiovascular    no chest  pains, no palpitations, no swelling, no dizziness, no murmurs .  Respiratory    no shortness of breath, no cough, no wheezing . GI     no nausea, no vomiting, no loss of appetite, no constipation,  no diarrhea . Urinary    no painful urination, no leakage of  urine, no frequent urination, no blood in urine . Musculoskeletal     no joint swelling, no joint stiffness .  [name]  VITAL SIGNS  .  Pain scale 7, Ht-in 65, Wt-lbs 214, BMI 35.61, BP 120/83, Pulse  sitting 83.  [name]  PHYSICAL EXAMINATION  .  OB/Gyn Chest/Thorax:  Breath sounds:  clear to auscultation.  Respiratory Effort  normal.  OB/Gyn Heart:  Rhythm:  regular.  Murmurs:  none.  Rate:  normal.  OB/Gyn Abdomen:  General:  soft.  Tenderness:  overall non-tender, mild tenderness to palpation of  lower portion of incision that is under the most pressure from  large abdomen.  Masses:  none.  Distention:  none.  Mildline incision with staples in place. No erythema. Small 1cm  area of induration approx 1/2 way between umbilicus and pubic  bone that feels  like a small seroma - very small amount of clear  fluid expressed (1 drop), no surrounding erythema or abnormal  warmth to the area. Incision otherwise well healed.  .  ASSESSMENTS  .  Postoperative seroma of subcutaneous tissue after  non-dermatologic procedure - L76.34 (Primary)  .  TREATMENT  .  Postoperative seroma of subcutaneous tissue after  non-dermatologic procedure  Notes: Overall patient's incision is healing very well. There is  no sign of infection: no erythema, no purulent discharge, patient  afebrile. Small area of induration likely seroma. Given no signs  or symptoms of infection and small size (1cm max) with minimal  drainage, did not remove any staples to drain. Will continue to  monitor. Plan for patient to come to clinic on Friday, when she  will be POD 8, to have her staples removed,  which will provide  her some relief.Advised patient to avoid taking both Motrin and  Aleve. Discussed alternating Motrin and Tylenol with oxycodone  for pain not well controlled. Patient stated understanding and  agreement with this plan.  .  FOLLOW UP  .  2 - 3 Days (Reason: staple removal)  .  [name]  Appointment Provider: Jaquita Folds  .  Electronically signed by Merwyn Katos , MD on  05/17/2016 at 01:25 PM EDT  .  CONFIRMATORY SIGN OFF  pt seen and examined w drs. ludgin and stroup. incision intact without evidence of infection. could be developing seroma. will monitor closely. assessment and plan formulated together.  .  Document electronically signed by Jaquita Folds MD  .

## 2016-05-16 NOTE — Progress Notes (Signed)
* * *        **  [name] [name]**    --- ---    89 Y old Female, DOB: Feb 04, 1970    [address], [place], [place] [address]    Home: [phone]    Provider: Jaquita Folds, MD        * * *    Telephone Encounter    ---    Answered by   Jaquita Folds  Date: 05/16/2016         Time: 05:18 PM    Caller   MD    --- ---            Reason   Resident add on today            Message                      Pt able to get her narctoic prescription filled, got good rest last night. Feels & sounds significantly better today. Still with some serosanguinous drainage from the incision but only minor on the dressing. Denies fevers. Overall much better than yesterday.                Action Taken   Jaquita Folds, MD 05/15/2016 5:23:08 PM > Advised patient to  come to clinic at 3pm for evaluation. Gave precautions including significant  increase in drainage, fever, pain, for which to call and come for earlier  evaluation. [name] - can you please put her on my schedule for 3pm tomorrow and  notify the nursing staff? [name],[name] 05/16/2016 8:11:01 AM > Patient  added to shedule, forwarded to Arlana Pouch                * * *                ---          * * *          Patient: [name] [name] DOB: 1970/09/02 Provider: Jaquita Folds,  MD 05/16/2016    ---    Note generated by eClinicalWorks EMR/PM Software ([email or web address])

## 2016-05-16 NOTE — Progress Notes (Signed)
* * *        [name] [name]**    --- ---    32 Y old Female, DOB: 17-Apr-1970, External MRN: [id number]    Account Number: [id number]    [address], [place], [place]    Home: [phone]    Insurance: [employer] Payer ID: PAPER    PCP: [name] Referring: [name] External Visit ID: [id number]    [facility] Facility[facility]        * * *    05/16/2016   **Appointment Provider:** Jaquita Folds **CHN#:** [id number]    --- ---      **Supervising Provider:** Merwyn Katos, MD    ---        Current Medications    ---    Taking     * Centrum - Tablet Orally     ---    * iron 1 tab Oral     ---    * Leuprolide Acetate (3 Month) 22.5 mg Kit as directed Intramuscular every 12 weeks    ---    Not-Taking/PRN    * None     ---      Past Medical History    ---       Uterine fibroids.        ---    Obesity.        ---    Fibrocystic breast disease.        ---    Umbilical hernia.        ---    Back pain.        ---      Surgical History    ---      Total abdominal hysterectomy, bilateral salpingectomy, umbilical hernia  repair 04/2016    ---      Family History    ---      Non-Contributory    ---      Social History    ---    Tobacco    history: _Never smoked_    Marital Status    _Single_    Alcohol    _Denies_       Allergies    ---      penicillin : hives,swelling: Allergy    ---    choroquine: hives, inbalance: Allergy    ---    [Allergies Verified]      Review of Systems    ---     _OB/GYN ROS_ :    General no fevers, no weakness, no memory loss, no swollen glands, no  bruising, no weight loss, no constant fatigue, no physical abuse, no emotional  abuse. Cardiovascular no chest pains, no palpitations, no swelling, no  dizziness, no murmurs. Respiratory no shortness of breath, no cough, no  wheezing. GI no nausea, no vomiting, no loss of appetite, no constipation, no  diarrhea. Urinary no painful urination, no leakage of urine, no frequent  urination, no blood in  urine. Musculoskeletal no joint swelling, no joint  stiffness.            Reason for Appointment    ---      1\. EVALUATION-PER Shyloh Krinke    ---      History of Present Illness    ---     _GENERAL_ :    Patient presenting for evaluation after having serous discharge from the  bottom part of her surgical wound two days ago and poor pain control.  Patient  is POD 6 after TAH-BS, umbilical hernia repair.    Re discharge: pt states two days ago she had a lot of fluid discharge, but  over yesterday and today it has been minimal with just small stains on her  abdominal binder.    Re pain: States she has some pain on the dependent most portion of the  incision and feels like the staples are "biting her skin". Otherwise pain well  controlled. States she is taking aleve, motrin and tylenol with occasional  oxycodone use.    Denies cp, palpitations, n/v, fevers. Was sweaty when she woke up this morning  but not sure if that was due to the temperature change.      Vital Signs    ---    Pain scale 7, Ht-in 65, Wt-lbs 214, BMI 35.61, BP 120/83, Pulse sitting 83.      Physical Examination    ---     _OB/Gyn Chest/Thorax_ :    Breath sounds: clear to auscultation.    Respiratory Effort normal.    _OB/Gyn Heart_ :    Rhythm: regular.    Murmurs: none.    Rate: normal.    _OB/Gyn Abdomen_ :    General: soft.    Tenderness: overall non-tender, mild tenderness to palpation of lower portion  of incision that is under the most pressure from large abdomen.    Masses: none.    Distention: none.    Mildline incision with staples in place. No erythema. Small 1cm area of  induration approx 1/2 way between umbilicus and pubic bone that feels like a  small seroma - very small amount of clear fluid expressed (1 drop), no  surrounding erythema or abnormal warmth to the area. Incision otherwise well  healed.      Assessments    ---    1\. Postoperative seroma of subcutaneous tissue after non-dermatologic  procedure - L76.34 (Primary)    ---       Treatment    ---       **1\. Postoperative seroma of subcutaneous tissue after non-dermatologic  procedure**    Notes: Overall patient's incision is healing very well. There is no sign of  infection: no erythema, no purulent discharge, patient afebrile. Small area of  induration likely seroma. Given no signs or symptoms of infection and small  size (1cm max) with minimal drainage, did not remove any staples to drain.  Will continue to monitor. Plan for patient to come to clinic on Friday, when  she will be POD 8, to have her staples removed, which will provide her some  relief.    Advised patient to avoid taking both Motrin and Aleve. Discussed alternating  Motrin and Tylenol with oxycodone for pain not well controlled. Patient stated  understanding and agreement with this plan.    ---      Follow Up    ---    2 - 3 Days (Reason: staple removal)    **Appointment Provider:** Jaquita Folds    Electronically signed by Merwyn Katos , MD on 05/17/2016 at 01:25 PM EDT    Sign off status: Completed        * * *        [facility]    [address]    [place], [place] [address]    Tel: [phone]    Fax: [phone]              * * *  Patient: [name] [name], [name] [name] DOB: 10-24-1970 Progress Note: Aneita Kiger R  Daleyza Gadomski 05/16/2016    ---    Note generated by eClinicalWorks EMR/PM Software ([email or web address])

## 2016-05-18 ENCOUNTER — Ambulatory Visit: Admit: 2016-05-18 | Payer: Commercial Managed Care - HMO

## 2016-05-18 ENCOUNTER — Ambulatory Visit

## 2016-05-18 ENCOUNTER — Ambulatory Visit: Admitting: Infectious Disease

## 2016-05-18 ENCOUNTER — Ambulatory Visit: Admitting: Obstetrics & Gynecology

## 2016-05-18 DIAGNOSIS — ? ANEMIA UNSPECIFIED: Secondary | ICD-10-CM

## 2016-05-18 DIAGNOSIS — Z4802 Encounter for removal of sutures: Principal | ICD-10-CM

## 2016-05-18 NOTE — Progress Notes (Signed)
.    Progress Notes  .  Patient: [name] [name]  Provider: Janie Morning  MD  .DOB: 1970-11-10 Age: 46 Y Sex: Female  Supervising Provider:: Welton Flakes, MD  Date: 05/18/2016  .  PCP: [name]    Date: 05/18/2016  .  --------------------------------------------------------------------------------  .  REASON FOR APPOINTMENT  .  1. STAPLE REMOVAL  .  HISTORY OF PRESENT ILLNESS  .  GENERAL:   46 yo POD8 TAH-BS for fibroid uterus presents for staple  removal. Patient is doing well. Some rare spotting but no heavy  bleeding. Some pain but using Motrin and Tylenol less frequently  and Oxy only rarely. No f/c, n/v. Eating, ambulation. Is taking  Colace. No constipation.  .  CURRENT MEDICATIONS  .  Taking Centrum - Tablet Orally  Taking Colace 100 MG Capsule 1 capsule as needed Orally Once a  day  Taking iron 1 tab Oral  Taking Motrin  Taking oxycodone  Taking tylenol 1 tab Oral  Not-Taking/PRN None  Medication List reviewed and reconciled with the patient  .  PAST MEDICAL HISTORY  .  Uterine fibroids  Obesity  Fibrocystic breast disease  Umbilical hernia  Back pain  .  ALLERGIES  .  penicillin : hives,swelling: Allergy  choroquine: hives, inbalance: Allergy  .  VITAL SIGNS  .  Pain scale 6, Ht-in 65, Wt-lbs 216, BMI 35.94, BP 128/87, Temp  97.7, Pulse sitting 88.  [name]  PHYSICAL EXAMINATION  .  OB/Gyn General:  General Appearance:  well-appearing, no acute distress.  OB/Gyn Abdomen:  General:  soft, midline incision from pubic bone to above  umbilicus, c/d/i with staples in place. No erythema, warmth, or  drainage..  Tenderness:  appropriately tender near incision.  .  ASSESSMENTS  .  Encounter for staple removal - Z48.02 (Primary)  .  TREATMENT  .  Encounter for staple removal  Notes: 46 yo POD8 TAH-BS for staple removal. Uncomplicated  removal. Will follow up with Dr. Renato Gails in 2 weeks for post-op  appointment.  .  FOLLOW UP  .  2 Weeks  .  [name]  Appointment Provider: Janie Morning  .  Electronically signed by  Welton Flakes , MD on  05/21/2016 at 08:44 AM EDT  .  CONFIRMATORY SIGN OFF  46 yo 10 days post TAH and BS for fibroids and menorrhagia here for staple removal still having some pain PLAN staples removed by resident I have personally reviewed the findings of the history and physical examinatioin with the resident. I have confirmed the key aspects of the history, physical, assessment and plan with the trainee and have edited Progress Note as required. KLAPHOLZ,HENRY 05/18/2016 12:09:02 PM >   .  Document electronically signed by Janie Morning  MD  .

## 2016-05-18 NOTE — Progress Notes (Signed)
* * *        **  [name] [name]**    --- ---    74 Y old Female, DOB: 1971-01-07    [address], [place], [place] [address]    Home: [phone]    Provider: Rhunette Croft, MD        * * *    Telephone Encounter    ---    Answered by   Rhunette Croft  Date: 05/18/2016         Time: 11:08 AM    Reason   reminder of apt    --- ---            Message                      Dear clinic,      2nd no show for this pt. Please remind her to attend her apt. with me 5/18. Please double book me at this time given hx of no-show      Thanks,      [name] [name]                Action Taken   Rhunette Croft , MD 05/18/2016 11:09:22 AM > Muir,Tiffany L  05/18/2016 12:39:15 PM > spoke to pt who is unable to make appt for 05/18.  Scheduled for 05/11 at 9:00 as a double book. please advise if this is ok.  Thank you. <i am ok with that. Make sure to delete the apt. for 5/18 then.  Janne Napoleon , MD 05/18/2016 1:23:50 PM > Muir,Tiffany L 05/18/2016 2:09:01  PM > scheduled, spoke to pt directly and mailed appt reminder. all sert. Thank  you                * * *              * * *        ---        Reason for Appointment    ---      1\. Reminder of apt    ---         **Medical History:**   Uterine fibroids.    ---    Obesity.    ---    Fibrocystic breast disease.    ---    Umbilical hernia.    ---    Back pain.    ---      Allergies    ---      penicillin : hives,swelling: Allergy    ---    choroquine: hives, inbalance: Allergy    ---          * * *          PatientOSWIN, [name] [name] DOB: 01/20/1970 Provider: Rhunette Croft, MD  05/18/2016    ---    Note generated by eClinicalWorks EMR/PM Software ([email or web address])

## 2016-05-18 NOTE — Progress Notes (Signed)
* * *        [name] [name]**    --- ---    46 Y old Female, DOB: February 06, 1970, External MRN: [id number]    Account Number: [id number]    [address], [place], [place]    Home: [phone]    Insurance: [employer] Payer ID: PAPER    PCP: [name] Referring: [name] External Visit ID: [id number]    Appointment Facility: [facility]        * * *    05/18/2016   **Appointment Provider:** Janie Morning **CHN#:** [id number]    --- ---      **Supervising Provider:** Welton Flakes, MD    ---        Current Medications    ---    Taking     * Centrum - Tablet Orally     ---    * Colace 100 MG Capsule 1 capsule as needed Orally Once a day    ---    * iron 1 tab Oral     ---    * Motrin     ---    * oxycodone     ---    * tylenol 1 tab Oral     ---    Not-Taking/PRN    * None     ---    * Medication List reviewed and reconciled with the patient    ---      Past Medical History    ---       Uterine fibroids.        ---    Obesity.        ---    Fibrocystic breast disease.        ---    Umbilical hernia.        ---    Back pain.        ---      Allergies    ---      penicillin : hives,swelling: Allergy    ---    choroquine: hives, inbalance: Allergy    ---    Forrestine Him Verified]        Reason for Appointment    ---      1\. STAPLE REMOVAL    ---      History of Present Illness    ---     _GENERAL_ :    46 yo POD8 TAH-BS for fibroid uterus presents for staple removal.    Patient is doing well. Some rare spotting but no heavy bleeding. Some pain but  using Motrin and Tylenol less frequently and Oxy only rarely. No f/c, n/v.  Eating, ambulation. Is taking Colace. No constipation.      Vital Signs    ---    Pain scale 6, Ht-in 65, Wt-lbs 216, BMI 35.94, BP 128/87, Temp 97.7, Pulse  sitting 88.      Physical Examination    ---     _OB/Gyn General_ :    General Appearance: well-appearing, no acute distress.    _OB/Gyn Abdomen_ :    General: soft, midline incision from pubic bone to  above umbilicus, c/d/i with  staples in place. No erythema, warmth, or drainage..    Tenderness: appropriately tender near incision.          Assessments    ---    1\. Encounter for staple removal - Z48.02 (Primary)    ---      Treatment    ---       **  1\. Encounter for staple removal**    Notes: 46 yo POD8 TAH-BS for staple removal. Uncomplicated removal. Will  follow up with Dr. Renato Gails in 2 weeks for post-op appointment.    ---      Follow Up    ---    2 Weeks    **Appointment Provider:** Janie Morning    Electronically signed by Welton Flakes , MD on 05/21/2016 at 08:44 AM EDT    Sign off status: Completed        * * *        [facility]    [address]    [place], [place] [address]    Tel: [phone]    Fax: [phone]              * * *          Patient: [name] [name], [name] [name] DOB: [birth year] Progress Note: Janie Morning  05/18/2016    ---    Note generated by eClinicalWorks EMR/PM Software ([email or web address])

## 2016-05-19 ENCOUNTER — Emergency Department: Admit: 2016-05-19 | Disposition: A | Discharge status: Home / Self Care | Payer: Commercial Managed Care - HMO

## 2016-05-19 ENCOUNTER — Ambulatory Visit

## 2016-05-19 ENCOUNTER — Ambulatory Visit: Admitting: Emergency Medicine

## 2016-05-19 ENCOUNTER — Emergency Department
Admit: 2016-05-19 | Disposition: A | Discharge status: Home / Self Care | Source: Home / Self Care | Attending: Emergency Medicine | Admitting: Emergency Medicine

## 2016-05-19 DIAGNOSIS — Y9289 Other specified places as the place of occurrence of the external cause: Secondary | ICD-10-CM

## 2016-05-19 DIAGNOSIS — E669 Obesity, unspecified: Secondary | ICD-10-CM

## 2016-05-19 DIAGNOSIS — T8130XA Disruption of wound, unspecified, initial encounter: Principal | ICD-10-CM

## 2016-05-19 DIAGNOSIS — Z90710 Acquired absence of both cervix and uterus: Secondary | ICD-10-CM

## 2016-05-19 DIAGNOSIS — G8929 Other chronic pain: Secondary | ICD-10-CM

## 2016-05-19 DIAGNOSIS — Y836 Removal of other organ (partial) (total) as the cause of abnormal reaction of the patient, or of later complication, without mention of misadventure at the time of the procedure: Secondary | ICD-10-CM

## 2016-05-19 DIAGNOSIS — Z880 Allergy status to penicillin: Secondary | ICD-10-CM

## 2016-05-19 DIAGNOSIS — M549 Dorsalgia, unspecified: Secondary | ICD-10-CM

## 2016-05-19 DIAGNOSIS — Z79891 Long term (current) use of opiate analgesic: Secondary | ICD-10-CM

## 2016-05-19 DIAGNOSIS — Z6833 Body mass index (BMI) 33.0-33.9, adult: Secondary | ICD-10-CM

## 2016-05-19 DIAGNOSIS — Z90722 Acquired absence of ovaries, bilateral: Secondary | ICD-10-CM

## 2016-05-19 DIAGNOSIS — L7634 Postprocedural seroma of skin and subcutaneous tissue following other procedure: Secondary | ICD-10-CM

## 2016-05-19 DIAGNOSIS — Z888 Allergy status to other drugs, medicaments and biological substances status: Secondary | ICD-10-CM

## 2016-05-19 NOTE — ED Provider Notes (Signed)
.  .  Name: [name] [name], [name] [name]  MRN: [id number]  Age: 46 yrs  Sex: Female  DOB: [birth year]  Arrival Date: 05/19/2016  Arrival Time: 12:13  Account#: [id number]  Bed A20  PCP: Tonny Branch  Chief Complaint:  - Wound Problem  .  Presentation:  05/05  12:15 Presenting complaint: Patient states: Pt. states that she had   kw24        surgery 1 week ago, yesterday staples were removed, today        noticed that the incision is opening up. Denies fever, chills,        c/o slight discomfort at site, with moderate amount of drainage.  12:15 Method Of Arrival: Walk In                                      kw24  12:15 Acuity: Adult 3                                                 kw24  .  Historical:  - Allergies:  12:18 PCN;                                                            kw24  12:18 chloroquine phosphate;                                          kw24  - Home Meds:  12:18 Colace oral oral [Active]; Motrin Oral [Active]; Oxycodone HCl  kw24        Oral [Active]; Tylenol Oral [Active];  - PMHx:  12:18 None;                                                           kw24  - PSHx:  12:18 Hysterectomy;                                                   kw24  .  - Social history: Smoking status: Patient states was never    smoker of tobacco. Patient/guardian denies using alcohol,    street drugs.  - The history from nurses notes was reviewed: and I agree with    what is documented.  .  .  Screening:  12:18 SEPSIS SCREENING SIRS Criteria (> = 2) No. Fall Risk None       kw24        identified. Exposure Risk/Travel Screening: None identified.  15:36 Safety screen: Patient feels safe. Suicide Screening: Patients  sn6        presentation: No risk factors. Nutritional screening: No        deficits  noted. Tuberculosis screening: No symptoms or risk        factors identified.  .  Vital Signs:  12:14 BP 121 / 78 Right Arm Sitting (auto/reg); Pulse 76 Monitor;     td9        Resp 17 Spontaneous; Temp 36.6(O); Pulse Ox 99% on R/A;  Weight        90.72 kg (R); Height 5 ft. 5 in. (165.10 cm) (R); Pain 5/10;  14:54 BP 118 / 69; Pulse 72; Resp 15; Pulse Ox 99% ;                  fb7  12:14 Body Mass Index 33.28 (90.72 kg, 165.10 cm)                     td9  .  Name:[name] [name], [name] [name]  [id number]  [id number]  Page 1 of 4  %%PAGE  .  Name: [name] [name], [name] [name]  MRN: [id number]  Age: 64 yrs  Sex: Female  DOB: 03-30-1970  Arrival Date: 05/19/2016  Arrival Time: 12:13  Account#: [id number]  Bed A20  PCP: Tonny Branch  Chief Complaint:  - Wound Problem  .  [name]  Neuro Vital Signs:  15:36 GCS: 15,                                                        sn6  .  Triage Assessment:  12:18 General: Appears in no apparent distress, comfortable, Behavior kw24        is appropriate for age, cooperative, pleasant. General: Denies        fever, feeling ill, fatigue, chills. Pain: Complains of pain in        abdomen Pain currently is 5 out of 10 on a pain scale.        Respiratory: Airway is patent Respiratory effort is even,        unlabored, Respiratory pattern is regular, symmetrical.  .  Assessment:  13:11 Reassessment: pt care assumed .pt resting on stretcher, alert,  cg10        pleasant cooperative. Pt is a 46 year old female with PMH of        recent total hysterectomy at [facility] with follow up yesterday        with surgeon to have abdominal staples removed. pt reports that        this AM, she took down abdominal dressing and noted that an        area of the wound appeared separated and she presented to ED        for futher evaluation. Pt denies recent fever, chills, fatigue,        increased redenss or drainage at site, increased abdominal        pain, nausea, vomiting, diarrhea, urinary symptoms. pt reports        she had a normal BM yesterday after the staples were removed.        She does not recall splinting the wound wth cough or BM. she        appears nondistressed. General: Appears in no apparent        distress, comfortable, Behavior is  appropriate for age,        cooperative, pleasant. Pain: Complains of pain in abdomen  midline incidion from umbillicus to symphisis pubis the pain        radiates to the: Pain does not radiate. Neuro: No deficits        noted. Level of Consciousness is awake, alert, obeys commands,        Oriented to person, place, time, Grips are equal bilaterally        Speech is normal, Facial symmetry appears normal. EENT: No        deficits noted. Cardiovascular: Capillary refill < 3 seconds        Heart tones S1 S2 present. Respiratory: Airway is patent        Trachea midline Respiratory effort is even, unlabored,        Respiratory pattern is regular, symmetrical. GI: Abdomen is        non- distended obese, Bowel sounds present X 4 quads.        abdoominal wound with dehiscence approx 1.5 cm length mid/lower        abdomen (1cm across) and another small dehiscence 1 cm at        symphisis pubis. pink subcutaneous tissue visible. small amout        of serosanguinous drainage. Abd is soft and non tender X 4        quads. GU: Denies burning with urination, inability to void,  .  Name:[name] [name], [name] [name]  [id number]  [id number]  Page 2 of 4  %%PAGE  .  Name: [name] [name], [name] [name]  MRN: [id number]  Age: 72 yrs  Sex: Female  DOB: [birth year]  Arrival Date: 05/19/2016  Arrival Time: 12:13  Account#: [id number]  Bed A20  PCP: Tonny Branch  Chief Complaint:  - Wound Problem  .        urinary frequency, reprots small amt of vagtinal pink        discharge. Skin: Skin is pink, warm / dry. surgical wound as        noted in abdominal assessment. Musculoskeletal: Circulation,        motion, and sensation intact Capillary refill < 3 seconds.  .  Observations:  12:13 Patient arrived in ED.                                          td9  12:13 Patient Visited By: [place]                           td9  12:17 Triage Completed.                                               kw24  12:31 Patient Visited By: Elray Buba                             dl13  12:45 Patient Visited By: Alvy Beal                                jp21  13:02 Patient Visited By: Esperanza Sheets  msm  14:10 OB-GYN for wound eval and steri strip closure.                  fb7  .  Interventions:  12:28 Demo Sheet Scanned into Chart                                   tl10  15:36 Armband on Placed in gown Call light in reach Bed in low        sn6        position Side rail up X1.  .  Care Coordination:  15:24 Patient referred to Care Coordination services by Met with pt   mo9        who is active with [facility]. Spoke with intake new orders        for Every other day wound packing. Due 5/7. Nursing note and MD        note faxed...  .  Outcome:  15:21 Discharge ordered by MD.                                        jp21  15:36 Discharged to home ambulatory. Condition: stable. Pain: Denies  sn6        pain. Discharge Assessment: Patient awake, alert and oriented x        3. No cognitive and/or functional deficits noted. Patient        verbalized understanding of disposition instructions. Patient        awake and alert. Oriented to person, place and time. Patient        verbalized understanding of disposition instructions. Patient        has no functional deficits. Chart Status Nursing note complete        and electronically signed.  15:37 Patient left the ED.                                            sn6  .  Signatures:  Charyl Dancer                        RN   fb7  Esperanza Sheets                         MD   Mady Haagensen, Teresa                             tl10  Deterding, Ladona Ridgel                       CCT  td9  .  Name:[name] [name], [name] [name]  [id number]  [id number]  Page 3 of 4  %%PAGE  .  Name: [name] [name], [name] [name]  MRN: [id number]  Age: 61 yrs  Sex: Female  DOB: [birth year]  Arrival Date: 05/19/2016  Arrival Time: 12:13  Account#: [id number]  Bed A20  PCP: Tonny Branch  Chief Complaint:  - Wound Problem  .  Cecil Cranker  RN   sn6  Dimple Casey                           RN   kw24  Erin Hearing                          RN   cg10  [place]og[place], [place]                            PA   jp21  Elray Buba                             dl13  [address], [place]                               mo9  .  .  .  .  .  .  .  .  .  .  .  .  .  .  .  .  .  .  .  .  .  .  .  .  .  .  .  .  .  .  .  .  .  .  .  .  .  Name:[name] [name], [name] [name]  [id number]  [id number]  Page 4 of 4  .  %%END

## 2016-05-19 NOTE — Progress Notes (Signed)
* * *        **  [name] [name]**    --- ---    36 Y old Female, DOB: [birth year]    [address], [place], [place] [address]    Home: [phone]    Provider: Dewitt Rota, MD        * * *    Telephone Encounter    ---    Answered by   Dewitt Rota  Date: 05/19/2016         Time: 10:33 AM    Caller   Patient    --- ---            Reason   Incision dehiscence            Message                      Patient called to report she had a TAH 9 days ago and her staples were removed yesterday. She reports that since the staples were removed, her incision has opened up about 4 cm and has been leaking serosanguinous fluid. She denies fever, chills, nausea, vomiting. She reports she continues to have mild incisional pain.                 Action Taken   Dewitt Rota, MD 05/19/2016 10:33:13 AM > Pt was  counseled to come to the ED for further evaluation. The pt was agreeable with  this plan and the ED was notified of her expected arrival. D/w Dr. Matt Holmes.                * * *                ---          * * *          PatientTULIP, [name] [name] DOB: Aug 18, 1970 Provider: Dewitt Rota, MD 05/19/2016    ---    Note generated by eClinicalWorks EMR/PM Software (www.[email or web address])

## 2016-05-19 NOTE — ED Provider Notes (Signed)
.  .  Name: [name] [name], [name] [name]  MRN: [id number]  Age: 46 yrs  Sex: Female  DOB: [birth year]  Arrival Date: 05/19/2016  Arrival Time: 12:13  Account#: [id number]  .  Working Diagnosis: Disruption of wound, unspecified  PCP: Roehm, Bethany  .  HPI:  05/05  12:54 This 46 yrs old White Female presents to ER via Walk In with    jp21        complaints of Wound Problem.  12:54 THis is a 64F who is post op day 9 from a TAH-BSO by Dr. Gaylyn Lambert   jp21        who presents with a wound recheck. She reports that yesterday        she had her staples removed in OBGYN clinic and noticed today        that the bottom of the wound at re-opened. Denies any bleeding        or drainage. Denies fevers, chills, or pain..  .  Historical:  - Allergies: PCN; chloroquine phosphate;  - Home Meds: Colace oral oral; Motrin Oral; Oxycodone HCl Oral; Tylenol Ora  - PMHx: None;  - PSHx: Hysterectomy;  - Social history: Smoking status: Patient states was never    smoker of tobacco. Patient/guardian denies using alcohol,    street drugs.  - The history from nurses notes was reviewed: and I agree with    what is documented.  .  .  ROS:  13:48 Constitutional: Negative for fever, chills, and weight loss,    jp21        Eyes: Negative for injury, pain, redness, and discharge, ENT:        Negative for injury, pain, and discharge, Cardiovascular:        Negative for chest pain, palpitations, and edema, Respiratory:        Negative for shortness of breath, cough, wheezing, and        pleuritic chest pain, Abdomen/GI: Negative for abdominal pain,        nausea, vomiting, diarrhea, and constipation, MS/Extremity:        Negative for injury and deformity, Skin: Positive for wound        dihiscence. Negative for drainage or bleeding Neuro: Negative        for headache, weakness, numbness, tingling, and seizure, Psych:        Negative for depression, anxiety, suicide ideation, homicidal        ideation, and hallucinations.  .  Vital Signs:  12:14 BP 121 / 78 Right Arm  Sitting (auto/reg); Pulse 76 Monitor;     td9        Resp 17 Spontaneous; Temp 36.6(O); Pulse Ox 99% on R/A; Weight        90.72 kg (R); Height 5 ft. 5 in. (165.10 cm) (R); Pain 5/10;  14:54 BP 118 / 69; Pulse 72; Resp 15; Pulse Ox 99% ;                  fb7  12:14 Body Mass Index 33.28 (90.72 kg, 165.10 cm)                     td9  .  Name:[name] [name], [name] [name]  [id number]  [id number]  Page 1 of 3  %%PAGE  .  Name: [name] [name], [name] [name]  MRN: [id number]  Age: 27 yrs  Sex: Female  DOB: 11-21-1970  Arrival Date: 05/19/2016  Arrival Time: 12:13  Account#: [id number]  .  Working Diagnosis: Disruption of  wound, unspecified  PCP: Roehm, Bethany  .  [name]  Neuro Vital Signs:  15:36 GCS: 15,                                                        sn6  .  Exam:  13:48 Constitutional:  This is a well developed, well nourished       jp21        patient who is awake, alert, and in no acute distress.        Head/Face:  Normocephalic, atraumatic. Eyes:  Pupils equal        round and reactive to light, extra-ocular motions intact.  Lids        and lashes normal.  Conjunctiva and sclera are non-icteric and        not injected.  Cornea within normal limits.  Periorbital areas        with no swelling, redness, or edema. ENT:  Nares patent.        Oropharynx with no redness, swelling, or masses, exudates, or        evidence of obstruction, uvula midline.  Mucous membranes moist        Neck:  Trachea midline, Supple, full range of motion without        nuchal rigidity, or vertebral point tenderness.  No        Meningismus. Respiratory:  Lungs have equal breath sounds        bilaterally, No increased work of breathing, no retractions or        nasal flaring. Chest/axilla:  Normal chest wall appearance and        motion.  Nontender with no deformity.  No lesions are        appreciated. Cardiovascular:  Regular rate and rhythm. No pulse        deficits. Abdomen/GI:  Soft, non-tender except minimally tender        around area of wound  dihiscence. No distension or tympany.  No        guarding or rebound. Skin:  Warm, dry with normal turgor.        Bottom 1 inch of wound has dehiesced, no purulence or        surrounding erythema, non-tender to palpation, normal        temperature to touch MS/ Extremity:  Pulses equal, no cyanosis.         Neurovascular intact.  Full, normal range of motion.  .  MDM:  13:48 ED course: 40F POD 9 from TAH-BSO/ventral hernia repair who     jp21        presents with wound dishiscence after staples were removed        yesterday. No signs of infection. Touched base with OBGYN who        will come evaluate and repair wound. Will follow their        recommendations for f/u.[name]  14:49 ED course: Ob came to see patient, wound packed at bedside and  jp21        closed with steri strips. Will have f/u on Monday.  .  Disposition Summary:  15:37 05/19/2016 15:21 Discharged to Home. Impression: Disruption of  jp21        wound, unspecified. Condition is Stable. Discharge  Instructions: POST OP WOUND CHECK, General. Forms are  .  Name:[name] [name], [name] [name]  [id number]  [id number]  Page 2 of 3  %%PAGE  .  Name: [name] [name], [name] [name]  MRN: [id number]  Age: 51 yrs  Sex: Female  DOB: Mar 27, 1970  Arrival Date: 05/19/2016  Arrival Time: 12:13  Account#: [id number]  .  Working Diagnosis: Disruption of wound, unspecified  PCP: Roehm, Bethany  .        Medication Reconciliation Form. Problem is an acute        exacerbation. Symptoms have improved.  .  Signatures:  Esperanza Sheets                         MD   msm  Deterding, [facility]                       CCT  td9  [place], [place]                          RN   sn6  Dimple Casey                           RN   kw24  Poggi, [place]                            PA   jp21  Elray Buba                             dl13  .  Corrections: (The following items were deleted from the chart)  13:38 12:54 THis is a 43F who is post op day 9 from a TAH-BSO by Dr.  Vonzella Nipple        Read who presents with a  wound recheck. She reports that        yesterday she had her staples removed and noticed today that        the bottom of the wound at re-opened. Denies any bleeding or        drainage. Denies fevers, chills, or pain.[name] jp21  13:50 13:48 Constitutional: Negative for fever, chills, and weight    jp21        loss, Eyes: Negative for injury, pain, redness, and discharge,        ENT: Negative for injury, pain, and discharge, Cardiovascular:        Negative for chest pain, palpitations, and edema, Respiratory:        Negative for shortness of breath, cough, wheezing, and        pleuritic chest pain, Abdomen/GI: Negative for abdominal pain,        nausea, vomiting, diarrhea, and constipation, MS/Extremity:        Negative for injury and deformity, jp21  15:37 15:21 05/19/2016 15:21 Discharged to Home. Impression:          sn6        Disruption of wound, unspecified. Condition is Stable. Forms        are Medication Reconciliation Form. Problem is an acute        exacerbation. Symptoms have improved. jp21  .  Document is preliminary until electronically or manually signed by the atte  nding physician  .  .  .  .  .  .  .  .  .  .  .  [name]  Name:[name] [name], [name] [name]  [id number]  [id number]  Page 3 of 3  .  %%END

## 2016-05-19 NOTE — ED Provider Notes (Signed)
Your patient [name] [name] was seen in the emergency room on 05/19/2016      Created By  [employer] on 05/19/2016 at 12:16 PM    Electronically Signed By Tonny Branch, MD [facility]) on 05/22/2016 at 06:53 AM

## 2016-05-22 ENCOUNTER — Ambulatory Visit: Admit: 2016-05-22 | Payer: Commercial Managed Care - HMO

## 2016-05-22 ENCOUNTER — Ambulatory Visit

## 2016-05-22 ENCOUNTER — Ambulatory Visit: Admitting: Obstetrics & Gynecology

## 2016-05-22 DIAGNOSIS — ? OCCIPITAL NEURALGIA: Secondary | ICD-10-CM

## 2016-05-22 DIAGNOSIS — L7634 Postprocedural seroma of skin and subcutaneous tissue following other procedure: Principal | ICD-10-CM

## 2016-05-22 NOTE — Progress Notes (Signed)
* * *        [name] [name]**    --- ---    59 Y old Female, DOB: February 01, 1970, External MRN: [id number]    Account Number: [id number]    [address] [place], [id number]    Home: [phone]    Insurance: [employer] [place]    PCP: [name] Referring: [name]    Appointment Facility: Gynecology        * * *    05/22/2016  Progress Notes: Toya Smothers, MD **CHN#:** [id number]    --- ---    ---        Reason for Appointment    ---      1\. POST OP PROBLEM VISIT-INCISIONS, PER Ranay Ketter    ---      History of Present Illness    ---     _GENERAL_ :    45yo G0 about 1.5 weeks s/p TAH/BS/LOA for multifibroid uterus. Pt has been  seen in ER and clinic for drainage from lower edge of incision. Has VNA for  wet to dry dressing changes. She is sporadic with taking the oxycodone for  pain control. She would like to go see her sister, and OB/GYN in [place],  for the rest of her recovery. Denies fever/chills/n/v.      Current Medications    ---    Taking     * Centrum - Tablet Orally     ---    * Colace 100 MG Capsule 1 capsule as needed Orally Once a day    ---    * iron 1 tab Oral     ---    * Leuprolide Acetate (3 Month) 22.5 mg Kit as directed Intramuscular every 12 weeks    ---    * Motrin     ---    * oxycodone     ---    * tylenol 1 tab Oral     ---    Not-Taking/PRN    * None     ---    * Medication List reviewed and reconciled with the patient    ---      Past Medical History    ---       Uterine fibroids.        ---    Obesity.        ---    Fibrocystic breast disease.        ---    Umbilical hernia.        ---    Back pain.        ---      Surgical History    ---      Total abdominal hysterectomy, bilateral salpingectomy, umbilical hernia  repair 04/2016    ---      Family History    ---      Non-Contributory    ---      Social History    ---    Tobacco    history: _Never smoked_    Marital Status    _Single_    Alcohol    _Denies_       Allergies    ---      penicillin : hives,swelling: Allergy    ---     choroquine: hives, inbalance: Allergy    ---      Hospitalization/Major Diagnostic Procedure    ---      s/p TAH/BS x 3 days 04/2016    ---  Review of Systems    ---     _OB/GYN ROS_ :    General no fevers, no weakness, no memory loss, no swollen glands, no  bruising, no weight loss, no constant fatigue, no physical abuse, no emotional  abuse. HEENT no visual problems, no eye pain, no ear pain, no difficulty  hearing, no headaches, no sinus trouble. Skin no changing moles, no rash, no  jaundice. Cardiovascular no chest pains, no palpitations, no swelling, no  dizziness, no murmurs. Respiratory no shortness of breath, no cough, no  wheezing. GI no nausea, no vomiting, no loss of appetite, no constipation, no  diarrhea. Urinary no painful urination, no leakage of urine, no frequent  urination, no blood in urine. Musculoskeletal no joint swelling, no joint  stiffness. Breast no breast lumps, no breast pain, no nipple discharge. Psych  no depression, no anxiety.          Vital Signs    ---    Pain scale 0, Ht-in 65, Wt-lbs 217.2, BMI 36.14, BP 93/66, HR 69, BSA 2.12,  Ht-cm 165.1, Wt-kg 98.52, Wt Change 1.2 lb.      Physical Examination    ---     _OB/Gyn General_ :    General Appearance: well-appearing, no acute distress.    Mental Status: alert and oriented.    Mood/Affect: pleasant.    _OB/Gyn Abdomen_ :    General: soft.    Tenderness: non tender, vertical incision well healed, except for 1.5cm defect  near the lower pole. Packing in place. Removed and repacked with wet dressing.  Defect several cm deep. No erythema..          Assessments    ---    1\. Postoperative seroma of subcutaneous tissue after non-dermatologic  procedure - L76.34 (Primary)    ---      45y G0 s/p TAH/BS/LOA with small area of incision that is still open due to  small seroma. Packed with wet to dry dressing.    --Encouraged daily VNA for we to dry dressing    --Encouraged oxycodone more frequently for pain control    --Will see her next  week before we decide if she is cleared to go to  [place]. I spoke with her sister over the phone about this plan.    ---      Follow Up    ---    1 Week    Electronically signed by Toya Smothers MD on 05/23/2016 at 10:37 AM EDT    Sign off status: Completed        * * *        Gynecology    [address]    [place], [place] [address]    Tel: [phone]    Fax: [phone]              * * *          Patient: [name] [name], [name] [name] DOB: 06-01-1970 Progress Note: Toya Smothers, MD  05/22/2016    ---    Note generated by eClinicalWorks EMR/PM Software ([email or web address])

## 2016-05-22 NOTE — Progress Notes (Signed)
.  Progress Notes  .  Patient: [name] [name]  Provider: Toya Smothers  MD  .  DOB: 24-Mar-1970 Age: 46 Y Sex: Female  .  PCP: [name]    Date: 05/22/2016  .  --------------------------------------------------------------------------------  .  REASON FOR APPOINTMENT  .  1. POST OP PROBLEM VISIT-INCISIONS, PER REED  .  HISTORY OF PRESENT ILLNESS  .  GENERAL:  45yo G0 about 1.5 weeks s/p TAH/BS/LOA  for multifibroid uterus. Pt has been seen in ER and clinic for  drainage from lower edge of incision. Has VNA for wet to dry  dressing changes. She is sporadic with taking the oxycodone for  pain control. She would like to go see her sister, and OB/GYN in  [place], for the rest of her recovery. Denies fever/chills/n/v.  .  CURRENT MEDICATIONS  .  Taking Centrum - Tablet Orally  Taking Colace 100 MG Capsule 1 capsule as needed Orally Once a  day  Taking iron 1 tab Oral  Taking Leuprolide Acetate (3 Month) 22.5 mg Kit as directed  Intramuscular every 12 weeks  Taking Motrin  Taking oxycodone  Taking tylenol 1 tab Oral  Not-Taking/PRN None  Medication List reviewed and reconciled with the patient  .  PAST MEDICAL HISTORY  .  Uterine fibroids  Obesity  Fibrocystic breast disease  Umbilical hernia  Back pain  .  ALLERGIES  .  penicillin : hives,swelling: Allergy  choroquine: hives, inbalance: Allergy  .  SURGICAL HISTORY  .  Total abdominal hysterectomy, bilateral salpingectomy, umbilical  hernia repair 04/2016  .  FAMILY HISTORY  .  Non-Contributory  .  SOCIAL HISTORY  .  .  Tobacco  history:Never smoked  .  [name]  Marital Status  Single  .  [name]  Alcohol  Denies  .  HOSPITALIZATION/MAJOR DIAGNOSTIC PROCEDURE  .  s/p TAH/BS x 3 days 04/2016  .  REVIEW OF SYSTEMS  .  OB/GYN ROS:  .  General    no fevers, no weakness, no memory loss, no swollen  glands, no bruising, no weight loss, no constant fatigue, no  physical abuse, no emotional abuse . HEENT    no visual problems,  no eye pain, no ear pain, no difficulty hearing, no  headaches, no  sinus trouble . Skin    no changing moles, no rash, no jaundice .  Cardiovascular    no chest pains, no palpitations, no swelling,  no dizziness, no murmurs . Respiratory    no shortness of breath,  no cough, no wheezing . GI    no nausea, no vomiting, no loss of  appetite, no constipation, no diarrhea . Urinary    no painful  urination, no leakage of urine, no frequent urination, no blood  in urine . Musculoskeletal    no joint swelling, no joint  stiffness . Breast    no breast lumps, no breast pain, no nipple  discharge . Psych    no depression, no anxiety .  [name]  VITAL SIGNS  .  Pain scale 0, Ht-in 65, Wt-lbs 217.2, BMI 36.14, BP 93/66, HR 69,  BSA 2.12, Ht-cm 165.1, Wt-kg 98.52, Wt Change 1.2 lb.  .  PHYSICAL EXAMINATION  .  OB/Gyn General:  General Appearance:  well-appearing, no acute distress.  Mental Status:  alert and oriented.  Mood/Affect:  pleasant.  OB/Gyn Abdomen:  General:  soft.  Tenderness:  non tender, vertical incision well healed, except  for 1.5cm defect near the lower pole. Packing in  place. Removed  and repacked with wet dressing. Defect several cm deep. No  erythema..  .  ASSESSMENTS  .  Postoperative seroma of subcutaneous tissue after  non-dermatologic procedure - L76.34 (Primary)  .  35y G0 s/p TAH/BS/LOA with small area of incision that is still  open due to small seroma. Packed with wet to dry  dressing.--Encouraged daily VNA for we to dry  dressing--Encouraged oxycodone more frequently for pain  control--Will see her next week before we decide if she is  cleared to go to [place]. I spoke with her sister over the  phone about this plan.  .  FOLLOW UP  .  1 Week  .  Electronically signed by Toya Smothers MD on  05/23/2016 at 10:37 AM EDT  .  Document electronically signed by Toya Smothers  MD  .

## 2016-05-27 ENCOUNTER — Ambulatory Visit

## 2016-05-27 NOTE — Telephone Encounter (Signed)
ON CALL PHONE NOTE     ON-CALL INFORMATION:     Person Calling: vna: Pam  PCP: roehm  Time/Date of call: 2:30 pm 5/13      CALL DETAILS & RESPONSES:   VNA seeing for wound care daily, some tinnitus last day without other ear symptoms or dizziness. would rec: restart iron, push fluids, if symptoms persist she should be seen in [facility]      ORDERS/PROBS/MEDS/ALL     Problems:   MAMMOGRAM, ABNORMAL, BILATERAL (ICD-793.80) (ICD10-R92.8)  UTERINE FIBROIDS (ICD-218.9) (ICD10-D25.9)  IRON DEFICIENCY 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) (ICD10-D50.9)  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) (ICD10-B18.1)  OBESITY, BMI 35-39.9, ADULT (ICD-278.00) (ICD10-E66.9)  UMBILICAL HERNIA (ICD-553.1) (ZOX09-U04.9)  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)  ANNUAL EXAM - SEPT 2017 (ICD-V70.0) (ICD10-Z00.00)    Allergies:   PENICILLIN (Critical)  * CHLOROQUINE (Moderate)  * INJECTAFER (Moderate)    Meds (prior to this call):   FERROUS SULFATE 325 (65 FE) MG ORAL TABLET (FERROUS SULFATE) one tablet by mouth daily          Created By Neale Burly MD on 05/27/2016 at 02:31 PM    Electronically Signed By Neale Burly MD on 05/27/2016 at 02:34 PM

## 2016-05-28 ENCOUNTER — Ambulatory Visit

## 2016-05-31 ENCOUNTER — Ambulatory Visit: Admit: 2016-05-31 | Payer: Commercial Managed Care - HMO

## 2016-05-31 ENCOUNTER — Ambulatory Visit

## 2016-05-31 ENCOUNTER — Ambulatory Visit: Admitting: Obstetrics & Gynecology

## 2016-05-31 DIAGNOSIS — L7634 Postprocedural seroma of skin and subcutaneous tissue following other procedure: Principal | ICD-10-CM

## 2016-05-31 DIAGNOSIS — ? ENCOUNTER FOR IMMUNIZATIO: Secondary | ICD-10-CM

## 2016-05-31 NOTE — Progress Notes (Signed)
.  Progress Notes  .  Patient: [name] [name]  Provider: Toya Smothers  MD  .  DOB: 1970-11-02 Age: 46 Y Sex: Female  .  PCP: [name]    Date: 05/31/2016  .  --------------------------------------------------------------------------------  .  REASON FOR APPOINTMENT  .  1. POST-OP  .  HISTORY OF PRESENT ILLNESS  .  GENERAL:  45yo G0 about 2 weeks s/p TAH/BS/LOA for  multifibroid uterus. Being seen by VNA for wet to dry dressing  changes for opening in lower part of vertical skin incision.  Doing well with pain control. Taking oxycodone minimally. Would  like to drive. Still wanting to go see her sister in [place].  Denies fever/chills/n/v.  .  CURRENT MEDICATIONS  .  Taking Centrum - Tablet Orally  Taking Colace 100 MG Capsule 1 capsule as needed Orally Once a  day  Taking iron 1 tab Oral  Taking Leuprolide Acetate (3 Month) 22.5 mg Kit as directed  Intramuscular every 12 weeks  Taking Motrin  Taking oxycodone  Taking tylenol 1 tab Oral  Not-Taking/PRN None  Medication List reviewed and reconciled with the patient  .  PAST MEDICAL HISTORY  .  Uterine fibroids  Obesity  Fibrocystic breast disease  Umbilical hernia  Back pain  .  ALLERGIES  .  penicillin : hives,swelling: Allergy  choroquine: hives, inbalance: Allergy  .  SURGICAL HISTORY  .  Total abdominal hysterectomy, bilateral salpingectomy, umbilical  hernia repair 04/2016  .  FAMILY HISTORY  .  Non-Contributory  .  SOCIAL HISTORY  .  .  Tobacco  history:Never smoked  .  [name]  Marital Status  Single  .  [name]  Alcohol  Denies  .  HOSPITALIZATION/MAJOR DIAGNOSTIC PROCEDURE  .  s/p TAH/BS x 3 days 04/2016  .  REVIEW OF SYSTEMS  .  OB/GYN ROS:  .  General    no fevers, no weakness, no memory loss, no swollen  glands, no bruising, no weight loss, no constant fatigue, no  physical abuse, no emotional abuse . HEENT    no visual problems,  no eye pain, no ear pain, no difficulty hearing, no headaches, no  sinus trouble . Skin    no changing moles, no rash, no  jaundice .  Cardiovascular    no chest pains, no palpitations, no swelling,  no dizziness, no murmurs . Respiratory    no shortness of breath,  no cough, no wheezing . GI    no nausea, no vomiting, no loss of  appetite, no constipation, no diarrhea . Urinary    no painful  urination, no leakage of urine, no frequent urination, no blood  in urine . Musculoskeletal    no joint swelling, no joint  stiffness . Breast    no breast lumps, no breast pain, no nipple  discharge . Psych    no depression, no anxiety .  [name]  VITAL SIGNS  .  Pain scale 0, Ht-in 65, Wt-lbs 219.8, BMI 36.57, BP 103/73, HR  79, BSA 2.14, Ht-cm 165.1, Wt-kg 99.7, Wt Change 2.6 lb.  .  PHYSICAL EXAMINATION  .  OB/Gyn General:  General Appearance:  well-appearing, no acute distress.  Mental Status:  alert and oriented.  Mood/Affect:  pleasant.  OB/Gyn Abdomen:  General:  soft, incision healing well, open area much more  superficial today, still requires wet to dry dressing changes, no  erythema, no pus.  Tenderness:  nontender.  Masses:  none.  .  ASSESSMENTS  .  Postoperative seroma of subcutaneous tissue after  non-dermatologic procedure - L76.34 (Primary)  .  82y G0 s/p TAH/BS/LOA with small area of incision that is still  open due to small seroma. Packed with wet to dry  dressing.--Encouraged daily VNA for wet to dry dressing--ok to  drive--Will see her next week before we decide if she is cleared  to go to [place].  .  FOLLOW UP  .  1 Year  .  Electronically signed by Toya Smothers MD on  06/06/2016 at 05:18 PM EDT  .  Document electronically signed by Toya Smothers  MD  .

## 2016-05-31 NOTE — Progress Notes (Signed)
* * *        [name] [name]**    --- ---    36 Y old Female, DOB: 05-Nov-1970, External MRN: [id number]    Account Number: [id number]    [address], [name], [id number]    Home: [phone]    Insurance: [employer] IN [place]    PCP: [name] Referring: [name]    Appointment Facility: Gynecology        * * *    05/31/2016  Progress Notes: Toya Smothers, MD **CHN#:** [phone]    --- ---    ---        Reason for Appointment    ---      1\. POST-OP    ---      History of Present Illness    ---     _GENERAL_ :    45yo G0 about 2 weeks s/p TAH/BS/LOA for multifibroid uterus. Being seen by  VNA for wet to dry dressing changes for opening in lower part of vertical skin  incision. Doing well with pain control. Taking oxycodone minimally. Would like  to drive. Still wanting to go see her sister in [place]. Denies  fever/chills/n/v.      Current Medications    ---    Taking     * Centrum - Tablet Orally     ---    * Colace 100 MG Capsule 1 capsule as needed Orally Once a day    ---    * iron 1 tab Oral     ---    * Leuprolide Acetate (3 Month) 22.5 mg Kit as directed Intramuscular every 12 weeks    ---    * Motrin     ---    * oxycodone     ---    * tylenol 1 tab Oral     ---    Not-Taking/PRN    * None     ---    * Medication List reviewed and reconciled with the patient    ---      Past Medical History    ---       Uterine fibroids.        ---    Obesity.        ---    Fibrocystic breast disease.        ---    Umbilical hernia.        ---    Back pain.        ---      Surgical History    ---      Total abdominal hysterectomy, bilateral salpingectomy, umbilical hernia  repair 04/2016    ---      Family History    ---      Non-Contributory    ---      Social History    ---    Tobacco    history: _Never smoked_    Marital Status    _Single_    Alcohol    _Denies_       Allergies    ---      penicillin : hives,swelling: Allergy    ---    choroquine: hives, inbalance: Allergy    ---       Hospitalization/Major Diagnostic Procedure    ---      s/p TAH/BS x 3 days 04/2016    ---      Review of Systems    ---  _OB/GYN ROS_ :    General no fevers, no weakness, no memory loss, no swollen glands, no  bruising, no weight loss, no constant fatigue, no physical abuse, no emotional  abuse. HEENT no visual problems, no eye pain, no ear pain, no difficulty  hearing, no headaches, no sinus trouble. Skin no changing moles, no rash, no  jaundice. Cardiovascular no chest pains, no palpitations, no swelling, no  dizziness, no murmurs. Respiratory no shortness of breath, no cough, no  wheezing. GI no nausea, no vomiting, no loss of appetite, no constipation, no  diarrhea. Urinary no painful urination, no leakage of urine, no frequent  urination, no blood in urine. Musculoskeletal no joint swelling, no joint  stiffness. Breast no breast lumps, no breast pain, no nipple discharge. Psych  no depression, no anxiety.          Vital Signs    ---    Pain scale 0, Ht-in 65, Wt-lbs 219.8, BMI 36.57, BP 103/73, HR 79, BSA 2.14,  Ht-cm 165.1, Wt-kg 99.7, Wt Change 2.6 lb.      Physical Examination    ---     _OB/Gyn General_ :    General Appearance: well-appearing, no acute distress.    Mental Status: alert and oriented.    Mood/Affect: pleasant.    _OB/Gyn Abdomen_ :    General: soft, incision healing well, open area much more superficial today,  still requires wet to dry dressing changes, no erythema, no pus.    Tenderness: nontender.    Masses: none.          Assessments    ---    1\. Postoperative seroma of subcutaneous tissue after non-dermatologic  procedure - L76.34 (Primary)    ---      45y G0 s/p TAH/BS/LOA with small area of incision that is still open due to  small seroma. Packed with wet to dry dressing.    --Encouraged daily VNA for wet to dry dressing    --ok to drive    --Will see her next week before we decide if she is cleared to go to  [place].    ---      Follow Up    ---    1 Year    Electronically signed  by Toya Smothers MD on 06/06/2016 at 05:18 PM EDT    Sign off status: Completed        * * *        Gynecology    [address]    [place], [place] [address]    Tel: [phone]    Fax: [phone]              * * *          Patient: [name] [name], [name] [name] DOB: 1970-10-02 Progress Note: Toya Smothers, MD  05/31/2016    ---    Note generated by eClinicalWorks EMR/PM Software ([email or web address])

## 2016-06-04 ENCOUNTER — Ambulatory Visit

## 2016-06-04 NOTE — Progress Notes (Signed)
[facility] Jun 04, 2016  [address]   [place]  [place] [address]  Main: [phone]  Fax: [phone]  Patient Portal: [email or web address]                    [name] [name]  [address]  [place], [place]  [address]                DOB:           MR#: [id number]  Dear  [name],      I am writing this letter to inform you that I will be leaving [facility] as of July 12, 2016. 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    My continuity clinic practice will be taken over by Dr. [name], a new intern who will begin on July 05, 2016. Please call my secretary at [phone], in order to arrange an appointment to meet your new doctor.  If you have changed primary care physicians or left the area, please let us know so we can update our records.    It has been a pleasure serving you. I will miss being your doctor, and wish you good health in the future.      Sincerely,      Tonny Branch, MD [facility]  Division of General Medicine  [facility]        Created By Caren Hazy Vo on 06/04/2016 at 12:01 PM    Electronically Signed By Caren Hazy Vo on 06/04/2016 at 12:01 PM

## 2016-06-07 ENCOUNTER — Ambulatory Visit: Admit: 2016-06-07 | Payer: Commercial Managed Care - HMO

## 2016-06-07 ENCOUNTER — Ambulatory Visit: Admitting: Obstetrics & Gynecology

## 2016-06-07 ENCOUNTER — Ambulatory Visit

## 2016-06-07 DIAGNOSIS — ? CHRON VENOUS HTN W/ULCER: Secondary | ICD-10-CM

## 2016-06-07 DIAGNOSIS — L7634 Postprocedural seroma of skin and subcutaneous tissue following other procedure: Principal | ICD-10-CM

## 2016-06-07 NOTE — Progress Notes (Signed)
.  Progress Notes  .  Patient: [name] [name]  Provider: Toya Smothers  MD  .  DOB: 26-Mar-1970 Age: 46 Y Sex: Female  .  PCP: [name]    Date: 06/07/2016  .  --------------------------------------------------------------------------------  .  REASON FOR APPOINTMENT  .  1. 1 WK. F/U, PER DR.REED  .  HISTORY OF PRESENT ILLNESS  .  GENERAL:  45yo G0 about 3 weeks s/p TAH/BS/LOA for  multifibroid uterus. Being seen by VNA for wet to dry dressing  changes for opening in lower part of vertical skin incision,  which is now closed. c/o increased incisional and perineal pain  after doing a full day of walking. She has suboptimal pain  control but has only been taking oxycodone at night. She has  booked a ticket to [place] for 06/05/16. Denies  fever/chills/n/v.  .  CURRENT MEDICATIONS  .  Taking Centrum - Tablet Orally  Taking Colace 100 MG Capsule 1 capsule as needed Orally Once a  day  Taking iron 1 tab Oral  Taking Leuprolide Acetate (3 Month) 22.5 mg Kit as directed  Intramuscular every 12 weeks  Taking Motrin  Taking oxycodone  Taking tylenol 1 tab Oral  Not-Taking/PRN None  Medication List reviewed and reconciled with the patient  .  PAST MEDICAL HISTORY  .  Uterine fibroids  Obesity  Fibrocystic breast disease  Umbilical hernia  Back pain  .  ALLERGIES  .  penicillin : hives,swelling: Allergy  choroquine: hives, inbalance: Allergy  .  SURGICAL HISTORY  .  Total abdominal hysterectomy, bilateral salpingectomy, umbilical  hernia repair 04/2016  .  FAMILY HISTORY  .  Non-Contributory  .  SOCIAL HISTORY  .  .  Tobacco  history:Never smoked  .  [name]  Marital Status  Single  .  [name]  Alcohol  Denies  .  HOSPITALIZATION/MAJOR DIAGNOSTIC PROCEDURE  .  s/p TAH/BS x 3 days 04/2016  .  REVIEW OF SYSTEMS  .  OB/GYN ROS:  .  General    no fevers, no weakness, no memory loss, no swollen  glands, no bruising, no weight loss, no constant fatigue, no  physical abuse, no emotional abuse . HEENT    no visual problems,  no eye pain,  no ear pain, no difficulty hearing, no headaches, no  sinus trouble . Skin    no changing moles, no rash, no jaundice .  Cardiovascular    no chest pains, no palpitations, no swelling,  no dizziness, no murmurs . Respiratory    no shortness of breath,  no cough, no wheezing . GI    no nausea, no vomiting, no loss of  appetite, no constipation, no diarrhea . Urinary    no painful  urination, no leakage of urine, no frequent urination, no blood  in urine . Musculoskeletal    no joint swelling, no joint  stiffness . Breast    no breast lumps, no breast pain, no nipple  discharge . Psych    no depression, no anxiety .  [name]  VITAL SIGNS  .  Pain scale 0, Ht-in 65, Wt-lbs 222.2, BMI 36.97, BP 107/73, HR  78, BSA 2.15, Ht-cm 165.1, Wt-kg 100.79, Wt Change 2.4 lb.  .  PHYSICAL EXAMINATION  .  OB/Gyn General:  General Appearance:  well-appearing, no acute distress.  Mental Status:  alert and oriented.  Mood/Affect:  pleasant.  OB/Gyn Abdomen:  General:  soft, incision healing well, incision closed, no  erythema, no pus.  Tenderness:  nontender.  Masses:  none.  .  ASSESSMENTS  .  Postoperative seroma of subcutaneous tissue after  non-dermatologic procedure - L76.34 (Primary)  .  67y G0 s/p TAH/BS/LOA with small area of incision that is now is  closed.--Recommended staying on top of pain and resting  more--Will be going to [place] this weekend (he--Otherwise f/u  1 yr for annual exam.  .  FOLLOW UP  .  1 Year  .  Electronically signed by Toya Smothers MD on  06/08/2016 at 05:38 PM EDT  .  Document electronically signed by Toya Smothers  MD  .

## 2016-06-07 NOTE — Progress Notes (Signed)
* * *        [name] [name]**    --- ---    34 Y old Female, DOB: 1970/07/07, External MRN: [id number]    Account Number: [id number]    [address], [name], [place]-[address]    Home: [phone]    Insurance: [employer] [place]    PCP: [name] Referring: [name]    Appointment Facility: Gynecology        * * *    06/07/2016  Progress Notes: [name] Smothers, MD **CHN#:** [phone]    --- ---    ---        Reason for Appointment    ---      1\. 1 WK. F/U, PER DR.Gadiel John    ---      History of Present Illness    ---     _GENERAL_ :    45yo G0 about 3 weeks s/p TAH/BS/LOA for multifibroid uterus. Being seen by  [facility] for wet to dry dressing changes for opening in lower part of vertical skin  incision, which is now closed. c/o increased incisional and perineal pain  after doing a full day of walking. She has suboptimal pain control but has  only been taking oxycodone at night. She has booked a ticket to [place] for  06/05/16. Denies fever/chills/n/v.      Current Medications    ---    Taking     * Centrum - Tablet Orally     ---    * Colace 100 MG Capsule 1 capsule as needed Orally Once a day    ---    * iron 1 tab Oral     ---    * Leuprolide Acetate (3 Month) 22.5 mg Kit as directed Intramuscular every 12 weeks    ---    * Motrin     ---    * oxycodone     ---    * tylenol 1 tab Oral     ---    Not-Taking/PRN    * None     ---    * Medication List reviewed and reconciled with the patient    ---      Past Medical History    ---       Uterine fibroids.        ---    Obesity.        ---    Fibrocystic breast disease.        ---    Umbilical hernia.        ---    Back pain.        ---      Surgical History    ---      Total abdominal hysterectomy, bilateral salpingectomy, umbilical hernia  repair 04/2016    ---      Family History    ---      Non-Contributory    ---      Social History    ---    Tobacco    history: _Never smoked_    Marital Status    _Single_    Alcohol    _Denies_       Allergies    ---       penicillin : hives,swelling: Allergy    ---    choroquine: hives, inbalance: Allergy    ---      Hospitalization/Major Diagnostic Procedure    ---      s/p TAH/BS  x 3 days 04/2016    ---      Review of Systems    ---     _OB/GYN ROS_ :    General no fevers, no weakness, no memory loss, no swollen glands, no  bruising, no weight loss, no constant fatigue, no physical abuse, no emotional  abuse. HEENT no visual problems, no eye pain, no ear pain, no difficulty  hearing, no headaches, no sinus trouble. Skin no changing moles, no rash, no  jaundice. Cardiovascular no chest pains, no palpitations, no swelling, no  dizziness, no murmurs. Respiratory no shortness of breath, no cough, no  wheezing. GI no nausea, no vomiting, no loss of appetite, no constipation, no  diarrhea. Urinary no painful urination, no leakage of urine, no frequent  urination, no blood in urine. Musculoskeletal no joint swelling, no joint  stiffness. Breast no breast lumps, no breast pain, no nipple discharge. Psych  no depression, no anxiety.          Vital Signs    ---    Pain scale 0, Ht-in 65, Wt-lbs 222.2, BMI 36.97, BP 107/73, HR 78, BSA 2.15,  Ht-cm 165.1, Wt-kg 100.79, Wt Change 2.4 lb.      Physical Examination    ---     _OB/Gyn General_ :    General Appearance: well-appearing, no acute distress.    Mental Status: alert and oriented.    Mood/Affect: pleasant.    _OB/Gyn Abdomen_ :    General: soft, incision healing well, incision closed, no erythema, no pus.    Tenderness: nontender.    Masses: none.          Assessments    ---    1\. Postoperative seroma of subcutaneous tissue after non-dermatologic  procedure - L76.34 (Primary)    ---      45y G0 s/p TAH/BS/LOA with small area of incision that is now is closed.    --Recommended staying on top of pain and resting more    --Will be going to [place] this weekend (he    --Otherwise f/u 1 yr for annual exam.    ---      Follow Up    ---    1 Year    Electronically signed by [name] Smothers MD on  06/08/2016 at 05:38 PM EDT    Sign off status: Completed        * * *        Gynecology    [address]    [place], [place] [address]    Tel: [phone]    Fax: [phone]              * * *          Patient: [name] [name], [name] [name] DOB: 10/07/1970 Progress Note: [name] Smothers, MD  06/07/2016    ---    Note generated by eClinicalWorks EMR/PM Software ([email or web address])

## 2016-06-12 ENCOUNTER — Ambulatory Visit

## 2016-06-14 ENCOUNTER — Ambulatory Visit

## 2016-06-15 ENCOUNTER — Ambulatory Visit

## 2016-07-12 ENCOUNTER — Ambulatory Visit: Admitting: Infectious Disease

## 2016-07-12 NOTE — Progress Notes (Signed)
* * *        **  [name] [name]**    --- ---    81 Y old Female, DOB: 12/02/1970    [address], [place], [place] [address]    Home: [phone]    Provider: Melburn Popper        * * *    Telephone Encounter    ---    Answered by   Claud Kelp  Date: 07/12/2016         Time: 01:07 PM    Reason   apt reminder    --- ---            Message                      Called patient and LM with a reminder that she is scheduled with Dr. Evangeline Gula tomorrw 6/29 for a NP apt. I left my phone number in case she needs to reschedule.                 Action Taken   [facility] 07/12/2016 1:08:19 PM >                * * *                ---          * * *          Patient: [name], [name] DOB: 05-Mar-1970 Provider: Melburn Popper  07/12/2016    ---    Note generated by eClinicalWorks EMR/PM Software ([email or web address])

## 2016-11-13 ENCOUNTER — Ambulatory Visit: Admit: 2016-11-13 | Payer: Commercial Managed Care - HMO

## 2016-11-13 ENCOUNTER — Ambulatory Visit: Admitting: Infectious Disease

## 2016-11-13 ENCOUNTER — Ambulatory Visit

## 2016-11-13 DIAGNOSIS — E669 Obesity, unspecified: Secondary | ICD-10-CM

## 2016-11-13 DIAGNOSIS — B181 Chronic viral hepatitis B without delta-agent: Principal | ICD-10-CM

## 2016-11-13 LAB — HX HIV TESTING: HX HIV 1-2: NONREACTIVE

## 2016-11-13 LAB — HX CHEM-LFT
HX ALANINE AMINOTRANSFERASE (ALT/SGPT): 22 IU/L (ref 0–54)
HX ALKALINE PHOSPHATASE (ALK): 95 IU/L (ref 40–130)
HX ASPARTATE AMINOTRANFERASE (AST/SGOT): 20 IU/L (ref 10–42)
HX BILIRUBIN, TOTAL: 0.5 mg/dL (ref 0.2–1.1)

## 2016-11-13 LAB — HX CHEM-OTHER: HX ALBUMIN: 4.2 g/dL (ref 3.4–4.8)
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
## 2016-11-14 LAB — HX IMMUNOLOGY
HX HEP B VIRAL DNA: 2065 {copies}/mL
HX HEPATITIS B CORE ANTIBODY: REACTIVE — AB
HX HEPATITIS B SURFACE AG CONFIRM: POSITIVE — AB
HX HEPATITIS B SURFACE ANTIBODY: 1.5 m[IU]/mL (ref 0.0–7.9)
HX HIV 1-2: NONREACTIVE

## 2016-11-20 LAB — HX IMMUNOLOGY
HX ANTI-HEPATITIS BE ANTIBODY: REACTIVE — AB
HX HEPATITIS BE ANTIGEN: NONREACTIVE
HX HEPATITIS DELTA ANTIBODY: NEGATIVE

## 2016-11-23 ENCOUNTER — Ambulatory Visit: Admitting: Infectious Disease

## 2016-11-23 ENCOUNTER — Ambulatory Visit

## 2016-11-29 ENCOUNTER — Ambulatory Visit: Admitting: Infectious Disease

## 2016-11-29 ENCOUNTER — Ambulatory Visit

## 2016-12-15 ENCOUNTER — Ambulatory Visit

## 2016-12-18 ENCOUNTER — Ambulatory Visit: Admit: 2016-12-18 | Payer: Commercial Managed Care - HMO

## 2016-12-18 ENCOUNTER — Ambulatory Visit

## 2016-12-18 ENCOUNTER — Ambulatory Visit: Admitting: Internal Medicine

## 2016-12-18 DIAGNOSIS — M25561 Pain in right knee: Principal | ICD-10-CM

## 2016-12-18 DIAGNOSIS — ? LOW BACK PAIN: Secondary | ICD-10-CM

## 2016-12-18 NOTE — Progress Notes (Signed)
Checkout  Return to Clinic 4 weeks  Next Visit Notes: f/u  Appointment Made Yes  RTC Date: 01/17/2017          Created By [name] on 12/18/2016 at 03:48 PM    Electronically Signed By [name] on 12/18/2016 at 03:48 PM

## 2016-12-18 NOTE — Progress Notes (Signed)
[facility] December 18, 2016  [address]   [place]  [place] [address]  Main: [phone]  Fax: [phone]  Patient Portal: [email or web address]              [name] [name]  [address]  [place], [place]  [address]          MR#: [id number]          DOB: November 18, 1970    To Whom It May Concern:    I am referring [name] [name] for PHYSICAL THERAPY:    Medical problems include:       Diagnosis:   Back pain - Lumbar-sacral strain   Neck pain - Cervical strain   Shoulder pain - Rotator cuff syndrome    Frequency:     Precautions:     Please evaluate and treat as indicated.      Sincerely,      [name], MD [facility])  [facility]        Created By [name] MD -KH 4B- on 12/18/2016 at 03:46 PM    Electronically Signed By [name] MD -KH 4B- on 12/18/2016 at 03:46 PM
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
## 2016-12-19 ENCOUNTER — Ambulatory Visit

## 2016-12-19 NOTE — Progress Notes (Signed)
[facility] December 19, 2016  [address]   [place]  [place] [address]  Main: [phone]  Fax: [phone]  Patient Portal: [name]  [address]  [place], [name]      Dear  Ms. ADEYEMI,      I am writing to let you know that the following appointments have been made for you:    Orthopedics   Date/Time: Thursday, 01/03/2017 at 11:30 am   Doctor: Nancie Neas  The Orthopedics suite is located in the [facility] [place] [facility], 7th floor. Should you need to make any changes in your appointment, please call [phone].    Please call me if you have any questions.        Sincerely,          [name] [name] / Office of [name] Shy, MD [facility]),   [facility]                  Created By [name] [name] on 12/19/2016 at 03:51 PM    Electronically Signed By [name] Shy MD -KH 4B- on 12/20/2016 at 04:33 PM

## 2016-12-19 NOTE — Progress Notes (Signed)
[facility] December 19, 2016  [address]   [place]  [place] [address]  Main: [phone]  Fax: [phone]  Patient Portal: [name]  [address]  [place], [place]  [address]                                MR#: [id number]                                         DOB:Feb 15, 1970    Dear  [name],      The results of the tests performed during your visit are as follows:    DX Spine == X rays of your spine did not show any fractures or concerning signs related to where you are having your pain.     Please follow up with orthopedics and physical therapists for further evaluation and management. Please call me if you have any questions.        Sincerely,      [name], MD [facility]),   [facility]      ** Please be aware of our new extended hours to better care for you. Hours are: Monday through Thursday from 8 am to 8 pm; Friday from 8 am to 5 pm; Saturday from 8 am to 12 noon.          Created By [name] on 12/19/2016 at 11:36 AM    Electronically Signed By [name] MD -KH 4B- on 12/19/2016 at 02:22 PM

## 2017-01-03 ENCOUNTER — Ambulatory Visit: Admit: 2017-01-03 | Payer: Commercial Managed Care - HMO

## 2017-01-03 ENCOUNTER — Ambulatory Visit: Admitting: Orthopaedic Surgery

## 2017-01-03 ENCOUNTER — Ambulatory Visit

## 2017-01-03 DIAGNOSIS — M222X1 Patellofemoral disorders, right knee: Principal | ICD-10-CM

## 2017-01-03 DIAGNOSIS — G8929 Other chronic pain: Secondary | ICD-10-CM

## 2017-01-03 DIAGNOSIS — M545 Low back pain: Secondary | ICD-10-CM
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
## 2017-01-17 ENCOUNTER — Ambulatory Visit

## 2017-01-25 ENCOUNTER — Ambulatory Visit: Admitting: Optometrist

## 2017-01-30 IMAGING — US US PELVIS COMPLETE
1 series · 14 of 25 positions shown · non-contrast
Comparison: none

[Series 1: us pelvis complete · 0.46mm/px · 14 of 44 slices shown]
[im 1/44]
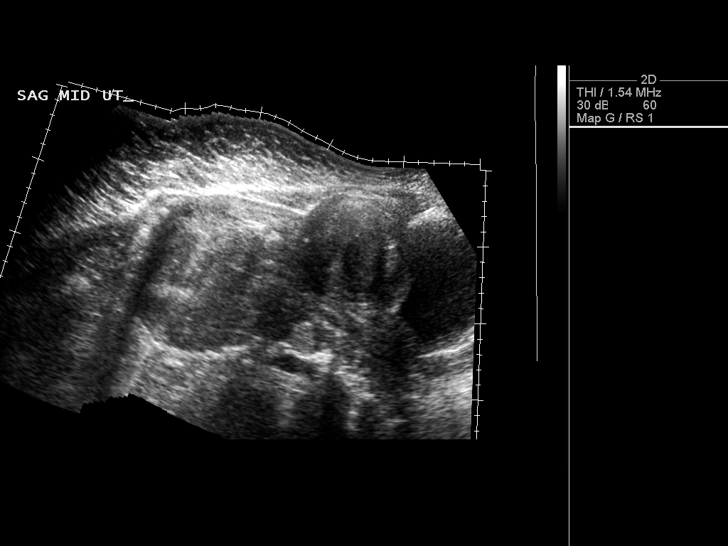
[im 4/44]
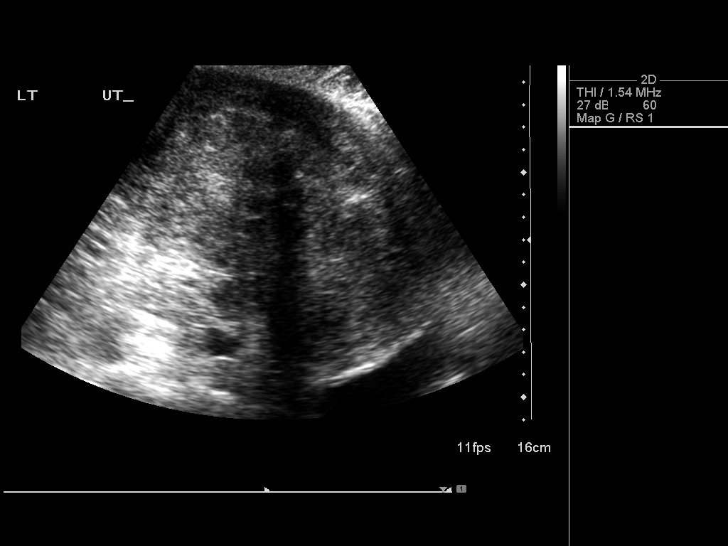
[im 8/44]
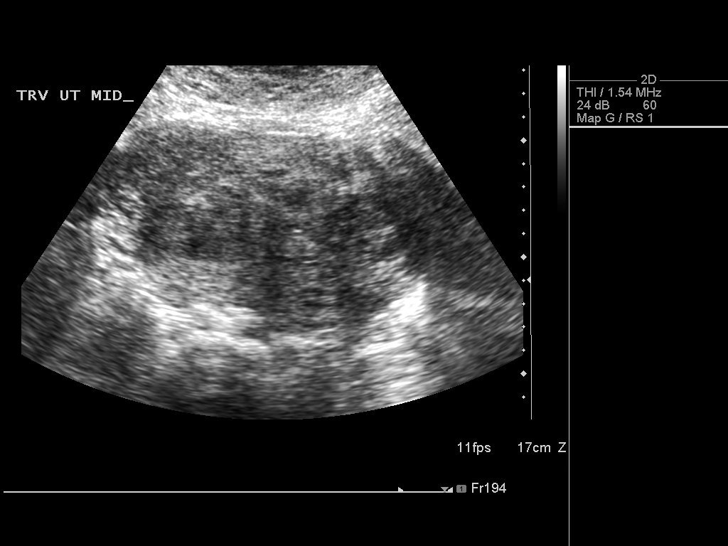
[im 11/44]
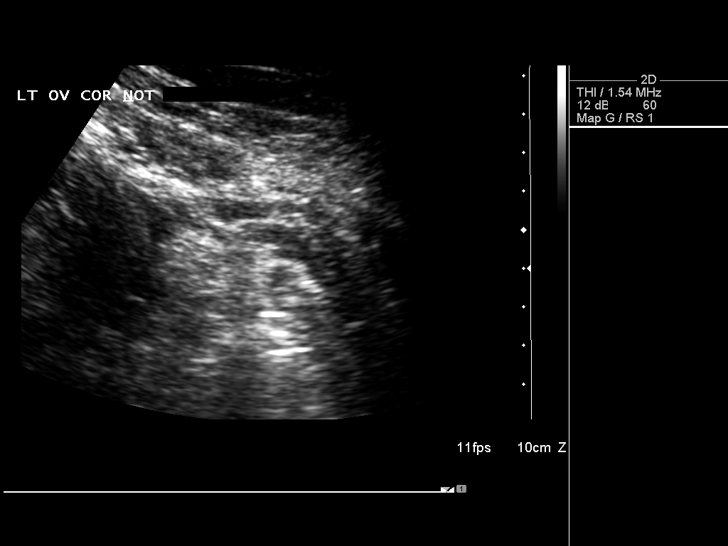
[im 15/44]
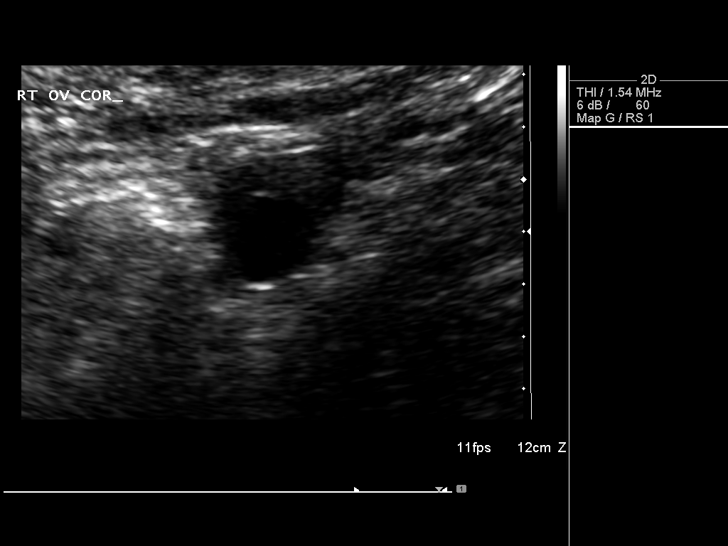
[im 17/44]
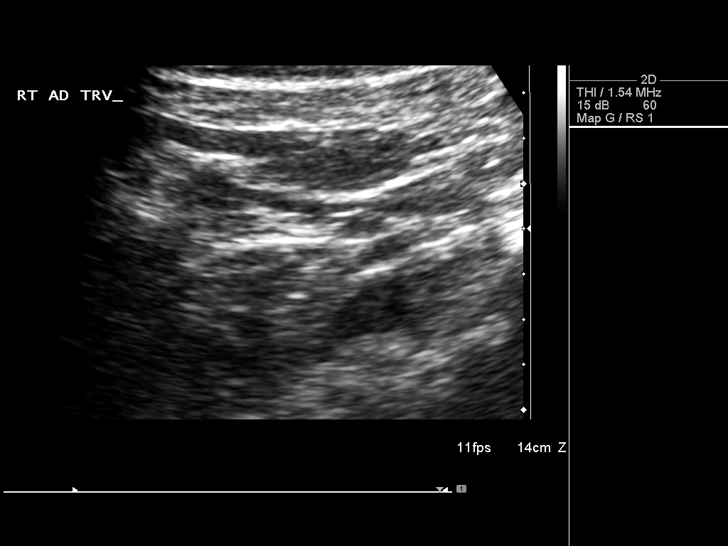
[im 20/44]
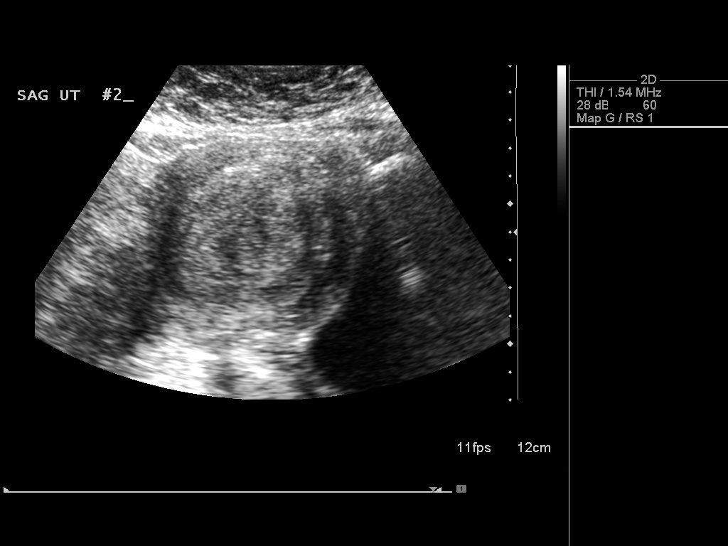
[im 24/44]
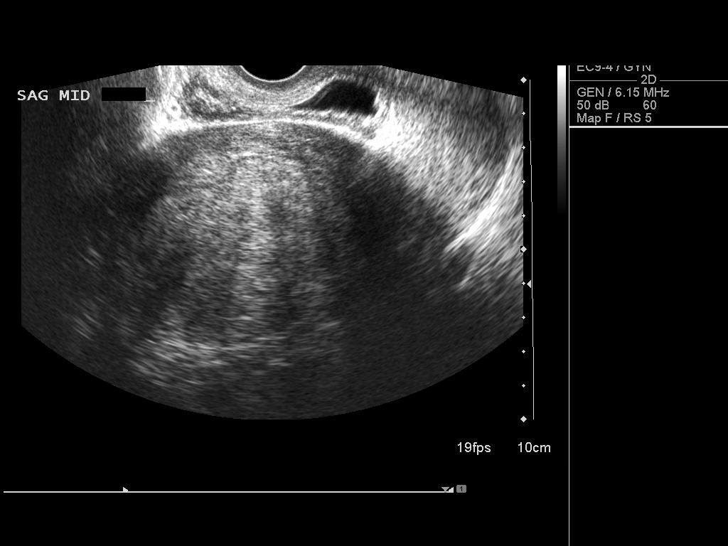
[im 27/44]
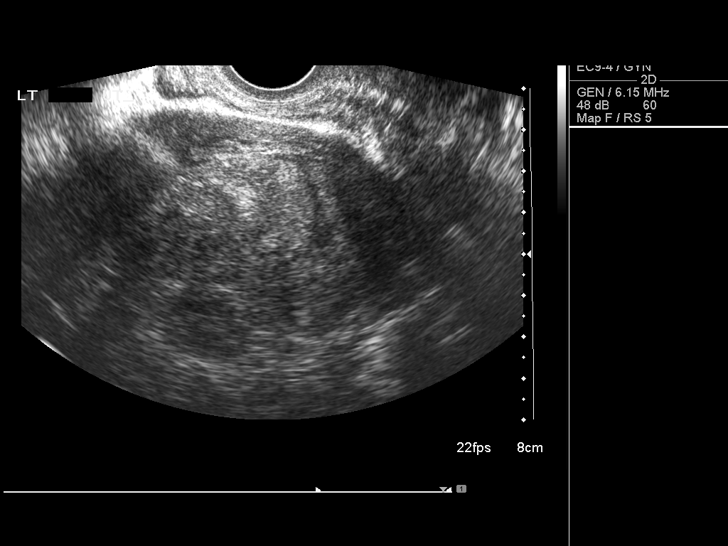
[im 29/44]
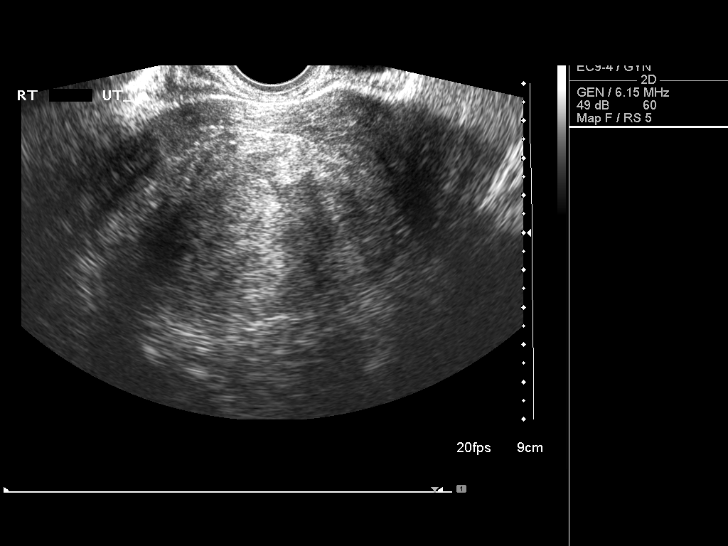
[im 33/44]
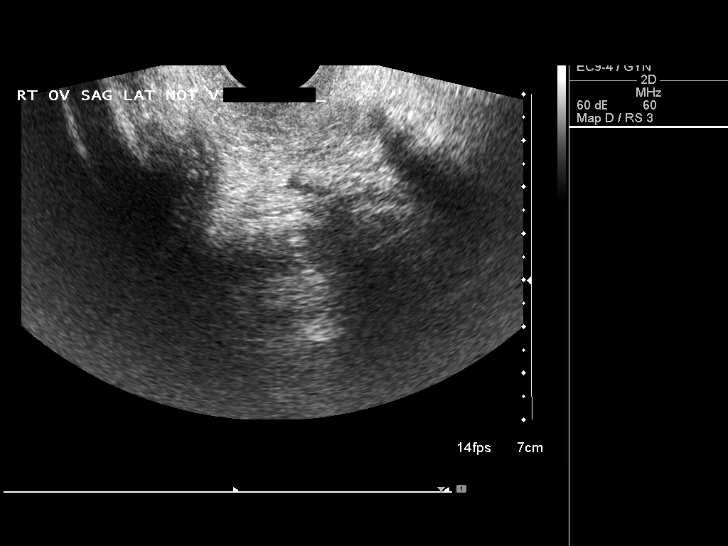
[im 36/44]
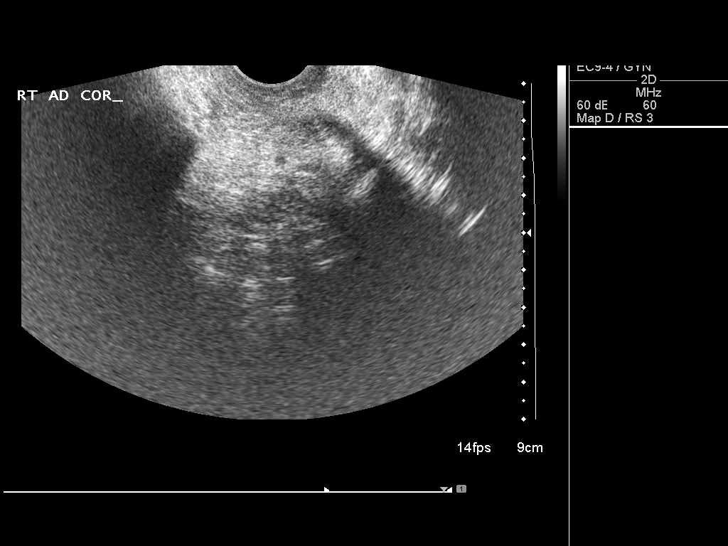
[im 40/44]
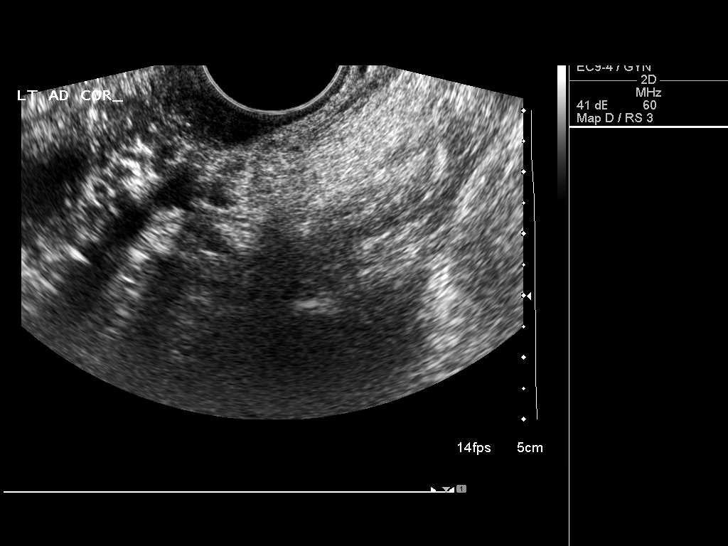
[im 44/44]
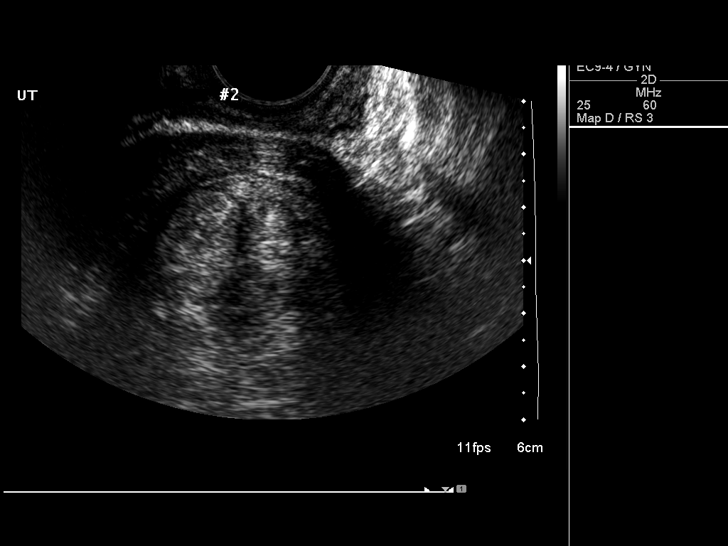

[14 of 25 positions shown; findings below may reference images not displayed]

Canned report from images found in remote index.

Refer to host system for actual result text.

## 2017-02-15 ENCOUNTER — Ambulatory Visit

## 2017-02-15 NOTE — Progress Notes (Signed)
Division of General Medicine - Future Orders      Requested tests to be done: ____4/29/19 (6 mo f/u)_________________________        Orders     SGOT (AST) [CPT-84450]  SGPT (ALT) [CPT-84460]  Alkaline Phosphatase (ALK) [CPT-84075]  Bilirubin, Total [CPT-82247]  U/S Abdomen Specific Area [CPT-76705]  HBV DNA [ZOX-09604]        Special orders:        Created By [name] MD -KH 4B- on 02/15/2017 at 02:21 PM    Electronically Signed By [name] MD -KH 4B- on 04/10/2017 at 03:19 PM

## 2017-03-22 ENCOUNTER — Ambulatory Visit: Admit: 2017-03-22 | Payer: Commercial Managed Care - HMO

## 2017-03-22 ENCOUNTER — Ambulatory Visit: Admitting: Orthopaedic Surgery

## 2017-03-22 ENCOUNTER — Ambulatory Visit: Admitting: Surgical

## 2017-03-22 ENCOUNTER — Ambulatory Visit

## 2017-03-22 DIAGNOSIS — ? CONDUCT HL UNI LT UNRESTR: Secondary | ICD-10-CM

## 2017-03-22 DIAGNOSIS — M546 Pain in thoracic spine: Principal | ICD-10-CM

## 2017-04-09 ENCOUNTER — Ambulatory Visit: Admit: 2017-04-09 | Payer: Commercial Managed Care - HMO

## 2017-04-09 ENCOUNTER — Ambulatory Visit

## 2017-04-09 ENCOUNTER — Ambulatory Visit: Admitting: Internal Medicine

## 2017-04-09 DIAGNOSIS — M549 Dorsalgia, unspecified: Secondary | ICD-10-CM

## 2017-04-09 DIAGNOSIS — R0683 Snoring: Secondary | ICD-10-CM

## 2017-04-09 DIAGNOSIS — R5383 Other fatigue: Principal | ICD-10-CM

## 2017-04-09 DIAGNOSIS — G43909 Migraine, unspecified, not intractable, without status migrainosus: Secondary | ICD-10-CM

## 2017-04-09 MED ORDER — SUMATRIPTAN SUCCINATE: 1 | 10 | 0 refills | 0 days | Status: AC

## 2017-04-09 NOTE — Progress Notes (Signed)
[facility] April 09, 2017  [address]   [place]  [place] [address]  Main: [phone]  Fax: [phone]  Patient Portal: [email or web address]            [facility]   Phone: [phone] . Fax: [phone]    Patient Name: [name] [name]  DOB: 01-13-1971 MR#: [id number]  Primary Care Physician: [name] MD -[facility]-  Phone #:   Primary Language: English  Insurance: H[employer] [facility] PCP   ID#: [id number]  Pt Address: [address]  [place], [place]  [address]  Home Phone: [phone]   Work Phone: [phone]  **********************************************************************************************************************************************  NOTE: Recent supporting office notes will be required with this request.  **********************************************************************************************************************************************  Ht: 64 in     Wt: 236.6 lbs     BMI: 40.76     Epworth Scale Total (required, see below): ______  Epsworth Sleepiness Scale (score each of following 8 questions from 0 to 3, record total above):    0 = Would never doze/sleep; 1 = Slight chance of doze/sleep; 2 = Mod chance of doze/sleep; 3 = High chance of doze/sleep  ___ Sitting and reading        ___ Watching TV ___ Sitting inactive in public place ___ Lying down in afternoon       ___ Sitting quiet after lunch   ___ Sitting and talking to someone        ___ Stopped for few minutes in traffic while driving ___ As passenger in motor vehicle for > 1 hr    Special Needs? _______________________________________________________  Primary Suspected Diagnosis: ____________________________________________  Has pt had previous sleep testing? ___ Yes ___ No (If yes, please submit Sleep Reports if available)  Schedule follow-up with Sleep Specialist? ___ Yes ___  No  **********************************************************************************************************************************************  Clinical Info (Select at least 3 for Adult only, to qualify for Home Study Testing):   ___ Excessive daytime sleepiness ___ Disruptive snoring  ___ Disturbed or restless sleep ___ Non-restorative sleep  ___ Hypertension  ___ Witnessed Apnea  ___ Irritability   ___ Hallucinations  ___ Decrease concentration ___ Memory loss   ___ Wake up gasping/choking  ___ Morning headaches  ___ Night Terrors  ___ Sleep walking/talking  ___ Bruxism   ___ Leg/Arm jerks  ___ Decrease libido  ___ Injury during sleep  ___ Nocturia   ___ Inability to fall/remain asleep  ___ Irreg. breathing/pauses during sleep ___ Enlarged Tonsils/abnormalities compromising respiration  ___ Other: ________________________    Medical Hx (Check all that apply): ___ Stroke (date__________) ___ Transient Ischemic Attack  ___ Coronary Artery Disease ___ Pulmonary Hypertension ___ Mod/Sev COPD, Lung disease  ___ Mod/Sev CHF  ___ Suspected Nocturnal Seizures ___ Suspected Narcolepsy  ___ Polycythemia  ___ Documented Obesity Hypoventilation Syndrome  ___ Arrhythmias: ___________ ___ H/o Central OR Mixed Sleep Apnea (must be in prior sleep report)  ___ Neurodegenerative Disorders / Cognitive Impairment preventing HST  ___ Sleep behaviors not recalled by pt, but suspicious of REM behavior disorder  ___ Neuromuscular Weakness affecting respiratory function or impairing activities (specify): ___________     Other (Please check all that apply): ___ Oxygen dependent for any reason ___ Recent T/A, UPP ___ Change in BMI > 5  ___ Neuromuscular Impairment; needs assistance with ADLs  ___ New Symptoms/Other: ______________    Physician signature: ______________________________________________ Date: 04/09/2017    Coordinator Checklist: Initial to notate that each area is complete  ___ Epsworth Sleepiness  ___ Clinical Info (must  select  3 for home study)  ___ Medical History (Office Notes)    Problems:  MIGRAINE (ICD-346.90) (ICD10-G43.909)  BACK PAIN (ICD-724.5) (ICD10-M54.9)  KNEE PAIN, RIGHT (ICD-719.46) (ICD10-M25.561)  MAMMOGRAM, ABNORMAL, BILATERAL (ICD-793.80) (ICD10-R92.8)  UTERINE FIBROIDS (ICD-218.9) (ICD10-D25.9)  IRON DEFICIENCY 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) (ICD10-D50.9)  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) (ICD10-B18.1)  OBESITY, BMI 35-39.9, ADULT (ICD-278.00) (ICD10-E66.9)  UMBILICAL HERNIA (ICD-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      Allergies:   PENICILLIN (Critical)  * CHLOROQUINE (Moderate)  * INJECTAFER (Moderate)          Created By [name] MD -[facility] 4B- on 04/09/2017 at 06:55 PM    Electronically Signed By [name] MD -[facility] 4B- on 04/09/2017 at 06:55 PM
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
## 2017-04-09 NOTE — Progress Notes (Signed)
Checkout  Return to Clinic 4 weeks  Next Visit Notes: f/u  Appointment Made No  Reason Appointment Not Made Other  Reason for Other: pt will call back for appt          Created By [name] on 04/09/2017 at 07:42 PM    Electronically Signed By [name] on 04/09/2017 at 07:43 PM

## 2017-04-10 ENCOUNTER — Ambulatory Visit

## 2017-04-10 NOTE — Progress Notes (Signed)
[facility] April 10, 2017  [address]   [place]  [place] [address]  Main: [phone]  Fax: [phone]  Patient Portal: [name]  [address]  [place], [name]      Dear  Ms. JARDIN,      I am writing to let you know that the following appointments have been made for you:    Neurology  Date/Time: Tuesday, 05/07/2017 at 8:00 am  Doctor: Herbie Drape MD  The Neurology suite is located in the [facility], 12th floor. Should you need to make any changes in your appointment, please call [phone].    Please call me if you have any questions.        Sincerely,          [name] / Office of  [name] MD -[facility]-,   [facility]                    Created By [name] on 04/10/2017 at 05:28 PM    Electronically Signed By Lannie Fields MD -[facility] 4B- on 04/23/2017 at 01:33 PM

## 2017-04-22 ENCOUNTER — Ambulatory Visit: Admitting: Critical Care Medicine

## 2017-05-09 ENCOUNTER — Ambulatory Visit

## 2017-05-09 NOTE — Telephone Encounter (Signed)
RESPONSE/ORDERS:  left VM instructions for pt to pick up lab slips 4/29 onward for routine HBV monitoring and RUQ [place]. Should be completed before mid-May  .....................................Marland KitchenMarland KitchenJanalyn Shy MD -[facility]-  May 09, 2017 3:20 PM    tried reaching patient again - connected to voicemail. Reminded pt to pick up lab slips and RUQUS order as it needs to be done every 6 months for surveillance. appears to have missed her neurology/migraine clinic appointment.  .....................................Marland KitchenMarland KitchenJanalyn Shy MD -[facility]-  May 27, 2017 3:09 PM                 ORDERS/PROBS/MEDS/ALL     Problems:   FATIGUE (ICD-780.79) (ICD10-R53.83)  SNORING (ICD-786.09) (ICD10-R06.83)  MIGRAINE (ICD-346.90) (ICD10-G43.909)  BACK PAIN (ICD-724.5) (ICD10-M54.9)  KNEE PAIN, RIGHT (ICD-719.46) (ICD10-M25.561)  OBESITY, BMI 35-39.9, ADULT (ICD-278.00) (ICD10-E66.9)  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) (ICD10-B18.1)  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)      MAMMOGRAM, ABNORMAL, BILATERAL (ICD-793.80) (ICD10-R92.8)  S/P TOTAL ABDOMINAL HYSTERECTOMY (ICD-V88.01) (ICD10-Z90.710)      UTERINE FIBROIDS (ICD-218.9) (ICD10-D25.9)      IRON DEFICIENCY 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) (ICD10-D50.9)      UMBILICAL 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 If initial dose was partially effective or headache recurs, may repeat a dose after =2 hours; Route: ORAL            Created By [name] MD -[facility] 4B- on 05/09/2017 at 03:19 PM    Electronically Signed By [name] MD -[facility] 4B- on 06/03/2017 at 11:25 AM

## 2017-05-28 ENCOUNTER — Ambulatory Visit: Admit: 2017-05-28 | Payer: Commercial Managed Care - HMO

## 2017-05-28 ENCOUNTER — Ambulatory Visit

## 2017-05-28 ENCOUNTER — Ambulatory Visit: Admitting: Internal Medicine

## 2017-05-28 DIAGNOSIS — ? DERMATOCHALASIS LEFT UPPE: Secondary | ICD-10-CM

## 2017-05-28 DIAGNOSIS — B181 Chronic viral hepatitis B without delta-agent: Principal | ICD-10-CM

## 2017-05-28 LAB — HX CHEM-LFT
HX ALANINE AMINOTRANSFERASE (ALT/SGPT): 23 IU/L (ref 0–54)
HX ALKALINE PHOSPHATASE (ALK): 87 IU/L (ref 40–130)
HX ASPARTATE AMINOTRANFERASE (AST/SGOT): 24 IU/L (ref 10–42)
HX BILIRUBIN, TOTAL: 0.4 mg/dL (ref 0.2–1.1)

## 2017-05-28 NOTE — Telephone Encounter (Signed)
RESPONSE/ORDERS:    Pt walked in and told me she wants to book appt for [place] abd  Prefers Wed/Fri late morning after 10AM  ......................................Marland KitchenJoyce Gross  May 28, 2017 1:56 PM    Lmom to [place] dept, will check [facility] for appt.  ......................................Marland KitchenJoyce Gross  May 28, 2017 2:01 PM    Booked for next Friday, 5/24 at 1PM. called pt and confirmed appt.  ......................................Marland KitchenJoyce Gross  May 29, 2017 12:55 PM    Note. Thank you!......................................Marland KitchenMertha Finders  May 29, 2017 1:38 PM             ORDERS/PROBS/MEDS/ALL     Problems:   FATIGUE (ICD-780.79) (ICD10-R53.83)  SNORING (ICD-786.09) (ICD10-R06.83)  MIGRAINE (ICD-346.90) (ICD10-G43.909)  BACK PAIN (ICD-724.5) (ICD10-M54.9)  KNEE PAIN, RIGHT (ICD-719.46) (ICD10-M25.561)  OBESITY, BMI 35-39.9, ADULT (ICD-278.00) (ICD10-E66.9)  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) (ICD10-B18.1)  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)      MAMMOGRAM, ABNORMAL, BILATERAL (ICD-793.80) (ICD10-R92.8)  S/P TOTAL ABDOMINAL HYSTERECTOMY (ICD-V88.01) (ICD10-Z90.710)      UTERINE FIBROIDS (ICD-218.9) (ICD10-D25.9)      IRON DEFICIENCY 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) (ICD10-D50.9)      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 If initial dose was partially effective or headache recurs, may repeat a dose after =2 hours; Route: ORAL            Created By Joyce Gross on 05/28/2017 at 01:54 PM    Electronically Signed By [name] MD -[facility] 4B- on 06/05/2017 at 10:07 AM

## 2017-05-30 LAB — HX IMMUNOLOGY
HX HEP B VIRAL DNA: 2452 {copies}/mL — AB
HX HEPATITIS B DNA VIRAL LOAD, LOG: 2.86 {Log_IU}/mL — AB
HX HEPATITIS B DNA VIRAL LOAD: 719 [IU]/mL — AB

## 2017-05-31 ENCOUNTER — Ambulatory Visit

## 2017-05-31 NOTE — Progress Notes (Signed)
[facility]  [address]  [place] [address]  Main: [phone]  Fax: [phone]  Patient Portal: [email or web address]      May 31, 2017            MRN: [id number]            DOB: 02/05/1970   [name] [name]   [address]   [place] [address]      The results of your recent tests are as follows:      Liver Tests:      ALT:     23  Normal 0 - 54    05/28/2017    AST:     24  Normal 10 - 42    05/28/2017    Total Bili:    0.4  Normal 0.2 - 1.1    05/28/2017    Alk Phos:    87  Normal 40 - 130    05/28/2017     Hepatitis Tests:      Hep B viral DNA:   2452  Normal=Not detected  05/28/2017       Sincerely,       Angelia Mould MD -CM-  [facility]            Created By Angelia Mould MD -[facility] 6A- on 05/31/2017 at 09:27 AM    Electronically Signed By Angelia Mould MD -[facility] 6A- on 05/31/2017 at 09:29 AM

## 2017-06-06 ENCOUNTER — Ambulatory Visit

## 2017-06-06 NOTE — Telephone Encounter (Signed)
RESPONSE/ORDERS:    Called patient to review bloodwork and check in - LFTs stable and wnl, HBV viral load about stable around 2k (was higher 4k previously). As discussed w Dr. Sherry Ruffing, with stable LFTs and viral load will hold off on treatment. She will have her abd [place] tomorrow. Has been taking sumatriptan but had bad diarrhea, so despite effectiveness, scaling back to prn severe migraines. Not yet scheduled for sleep study. Will call clinic back to reschedule neuro appt - no pen and unable to take clinic number over the phone - missed original appt due to work schedule.   .....................................Marland KitchenMarland KitchenJanalyn Shy MD -[facility]-  Jun 06, 2017 4:05 PM             ORDERS/PROBS/MEDS/ALL     Problems:   FATIGUE (ICD-780.79) (ICD10-R53.83)  SNORING (ICD-786.09) (ICD10-R06.83)  MIGRAINE (ICD-346.90) (ICD10-G43.909)  BACK PAIN (ICD-724.5) (ICD10-M54.9)  KNEE PAIN, RIGHT (ICD-719.46) (ICD10-M25.561)  OBESITY, BMI 35-39.9, ADULT (ICD-278.00) (ICD10-E66.9)  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) (ICD10-B18.1)  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)      MAMMOGRAM, ABNORMAL, BILATERAL (ICD-793.80) (ICD10-R92.8)  S/P TOTAL ABDOMINAL HYSTERECTOMY (ICD-V88.01) (ICD10-Z90.710)      UTERINE FIBROIDS (ICD-218.9) (ICD10-D25.9)      IRON DEFICIENCY 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) (ICD10-D50.9)      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 If initial dose was partially effective or headache recurs, may repeat a dose after =2 hours; Route: ORAL            Created By [name] MD -[facility] 4B- on 06/06/2017 at 04:04 PM    Electronically Signed By [name] MD -[facility] 4B- on  06/06/2017 at 04:07 PM

## 2017-06-07 ENCOUNTER — Ambulatory Visit: Admit: 2017-06-07 | Payer: Commercial Managed Care - HMO

## 2017-06-07 ENCOUNTER — Ambulatory Visit

## 2017-06-07 ENCOUNTER — Ambulatory Visit: Admitting: Internal Medicine

## 2017-06-07 DIAGNOSIS — ? PRIMARY OSTEOARTHRITIS LE: Secondary | ICD-10-CM

## 2017-06-07 DIAGNOSIS — B181 Chronic viral hepatitis B without delta-agent: Principal | ICD-10-CM

## 2017-06-11 ENCOUNTER — Ambulatory Visit

## 2017-06-11 NOTE — Progress Notes (Signed)
[facility]  [address]  [place] [address]  Main: [phone]  Fax: [phone]  Patient Portal: [email or web address]      Jun 14, 2017            MRN: [id number]            DOB: 26-Dec-1970   [name] [name]   [address]   [place] [address]      The results of your recent tests are as follows:      Your ultrasound was unchanged from prior. You will need a repeat ultrasound (with labwork) in 6 months.     Other Tests:    Ultrasound abdomen =  Grade 1 hepatic steatosis (fatty liver) without evidence of a liver mass, unchanged from prior. Area in the gallbladder suggestive of a benign growth of the gallbadder wall and mucosa.       Sincerely,       [name] MD -[facility]-  [facility]    Results Provided by: Mail           Created By [name] on 06/11/2017 at 01:58 PM    Electronically Signed By [name] MD -[facility] 4B- on 06/14/2017 at 02:39 PM

## 2017-06-30 ENCOUNTER — Emergency Department: Admit: 2017-06-30 | Disposition: A | Discharge status: Home / Self Care | Payer: Commercial Managed Care - HMO

## 2017-06-30 ENCOUNTER — Ambulatory Visit

## 2017-06-30 ENCOUNTER — Ambulatory Visit: Admitting: Emergency Medicine

## 2017-06-30 ENCOUNTER — Emergency Department: Admit: 2017-06-30 | Disposition: A | Discharge status: Home / Self Care | Source: Home / Self Care

## 2017-06-30 DIAGNOSIS — ? PERSONAL HISTORY OF NICOT: Secondary | ICD-10-CM

## 2017-06-30 DIAGNOSIS — K047 Periapical abscess without sinus: Principal | ICD-10-CM

## 2017-06-30 NOTE — ED Provider Notes (Signed)
.  .  Name: [name] [name] [name] [name]  MRN: [id number]  Age: 47 yrs  Sex: Female  DOB: 1970/12/05  Arrival Date: 06/30/2017  Arrival Time: 11:51  Account#: [id number]  .  Working Diagnosis: Periapical abscess without sinus  PCP: [name] [name]  .  Historical:  - Allergies: chloroquine phosphate; PCN;  - Home Meds: Colace oral; Motrin Oral; Oxycodone HCl Oral; Tylenol Oral;  - PMHx: None;  - PSHx: hysterectomy;  - Social history: Smoking status: The patient is not a current    smoker.  .  .  Vital Signs:  06/16  12:00 BP 117 / 80; Pulse 78; Resp 18; Temp 36.9(O); Pulse Ox 98% on   lk        R/A; Pain 7/10;  .  Glasgow Coma Score:  12:00 Eye Response: spontaneous(4). Verbal Response: oriented(5).     lk        Motor Response: obeys commands(6). Total: 15.  .  Attending Notes:  12:13 Attending HPI: HPI: 46 year old female presents with severe     mm34        dental pain. Attending ROS Constitutional: Negative for fever,        ENT: Positive for dental pain, Respiratory: Negative for        shortness of breath. I have reviewed the Nurses Notes, and I        agree.  .  Dental Consult:  13:01 CONSULTED BY: Dr. Lucianne [name], [facility] resident. REASON FOR CONSULT:       vl1        Facial Pain Location right jaw Onset other 48 hours Pain is        described as: dull. throbbing. other Becomes sharp and        referring to the ear occasionally pain scale on an scale of        1-10 patient's pain is a: 7 Other Patient states that pain was        10/10 last night. She states that she had similar pain on this        area a few months ago but did not have it checked out. She had        a filling on tooth #30 that fell out, and is currently filled        by a dental cement she bought at a pharmacy. PHYSICAL EXAM        General: Patient appeares in distress. HEENT: No trauma to        head. No strider. No drooling. No trismus. Neck: No cervical        Lymphadenopathy Lips are pink, moist Oral Mucosa is pink and        moist Dental caries are noted on  teeth The symptomatic tooth        is/are lower right first molar (#30) Other There is tenderness        to palpation on the buccal and lingual mucosa on area of tooth        #30. #30 is tender to percussion. No notable swelling or  .  Name:[name], [name]  [id number]  [id number]  Page 1 of 2  %%PAGE  .  Name: [name] [name] [name] [name]  MRN: [id number]  Age: 16 yrs  Sex: Female  DOB: May 29, 1970  Arrival Date: 06/30/2017  Arrival Time: 11:51  Account#: [id number]  .  Working Diagnosis: Periapical abscess without sinus  PCP: [name] [name]  .        fluctuance. Procedures Dental Block  Indication: Pain on lower        right jaw Agent Used 0.5% Marcaine 1.35ml Clinical effect:        Complete relief Tolerated well. IMPRESSION: Pulpitis #30 other        With associated periapical periodontitis. Recommendations:        Discharge to home. Medications Continue course of antibiotics        and anti-inflammatory PRN Patient states that she will see        private dentist on Monday. Also gave her instructions to visit        the [facility] emergency clinic if she can not be seen by her        dentist that day.  .  Disposition Summary:  06/30/17 13:04  Discharge Ordered        Location: Home                                                  mm34        Problem: new                                                    mm34        Symptoms: have improved                                         mm34        Condition: Stable                                               mm34        Diagnosis          - Periapical abscess without sinus                            mm34        Followup:                                                       mm34          - With:          - When: As needed          - Reason: Continuance of care, As needed        Discharge Instructions:          - Discharge Summary Sheet                                     mm34          - ABSCESS, Abx Only  mm34        Forms:           - Medication Reconciliation Form                              mm34  Signatures:  [name] [name]                            RN   lk  [name] [name]                         RN   ks3  [name] [name]                           MD   mm34  [name] [name]                                  eg7  Clark [name], [name]                          DMD  vl1  [name] [name]                              QI69  .  Document is preliminary until electronically or manually signed by the atte  nding physician  .  .  .  .  .  .  Name:[name] [name]  [id number]  [id number]  Page 2 of 2  .  %%END

## 2017-06-30 NOTE — ED Provider Notes (Signed)
.  .  Name: [name] [name], [name] [name]  MRN: [id number]  Age: 47 yrs  Sex: Female  DOB: 1970-02-27  Arrival Date: 06/30/2017  Arrival Time: 11:51  Account#: [id number]  Bed E-Eye  PCP: [name]  Chief Complaint:  [name] [name] @ 1300 for Toothache  .  Presentation:  06/16  11:58 Presenting complaint: Patient states: Pt here with a toothache  lk        since Friday, on the bottom right. Pt states fever at night.  11:58 Method Of Arrival: Walk In                                      lk  11:58 Acuity: Adult 4                                                 lk  .  Historical:  - Allergies:  12:00 chloroquine phosphate;                                          lk  12:00 PCN;                                                            lk  - Home Meds:  12:00 Colace oral [Active]; Motrin Oral [Active]; Oxycodone HCl Oral  lk        [Active]; Tylenol Oral [Active];  - PMHx:  12:00 None;                                                           lk  - PSHx:  12:00 hysterectomy;                                                   lk  .  - Social history: Smoking status: The patient is not a current    smoker.  .  .  Screening:  12:00 SEPSIS SCREENING - Temp > 38.3 or < 36.0 No - Heart Rate > 90   lk        No - Respiratory > 20 No - SBP < 90 No Does this patient have a        suspected source of infection at this timequestion No SIRS Criteria (>        = 2) No. Safety screen: Patient feels safe. Suicide Screening:        Patient denies thoughts of harm. Fall Risk None identified.        Exposure Risk/Travel Screening: Has not been out of the country.  .  Vital Signs:  12:00 BP 117 / 80; Pulse 78; Resp 18;  Temp 36.9(O); Pulse Ox 98% on   lk        R/A; Pain 7/10;  .  Glasgow Coma Score:  12:00 Eye Response: spontaneous(4). Verbal Response: oriented(5).     lk        Motor Response: obeys commands(6). Total: 15.  .  Triage Assessment:  12:00 General: Appears uncomfortable, Behavior is cooperative. Pain:  lk        Complains of pain in  bottom right dental Pain currently is 7/10.  .  Name:[name] [name]  [id number]  [id number]  Page 1 of 3  %%PAGE  .  Name: [name] [name]  MRN: [id number]  Age: 20 yrs  Sex: Female  DOB: 09-05-1970  Arrival Date: 06/30/2017  Arrival Time: 11:51  Account#: [id number]  Bed E-Eye  PCP: [name]  Chief Complaint:  [name] [name] @ 1300 for Toothache  .  [name]  Assessment:  12:15 Reassessment: assumed care of pt. Presents for further eval of  ks3        toothache since Friday. Seen at urgent care, unable to see her        dentist, given antibiotics and pain meds but presents today in        hopes of receiving a local pain injection. Spoke with [facility] who will see pt. General: Appears in no apparent        distress, comfortable, well nourished, well groomed, Behavior        is appropriate for age. Pain: Complains of pain in mouth Pain        currently is 10/10. EENT: Poor dentition noted.  12:37 Reassessment: dental into see pt.                               ks3  .  Observations:  11:51 Patient arrived in ED.                                          sb36  11:51 Patient Visited By: Joselyn Glassman                             sb36  11:56 Registration completed.                                         eg7  11:56 Patient Visited By: Cecille Rubin                                 eg7  11:59 Triage Completed.                                               lk  12:04 Patient Visited By: Caro Hight                             ks3  12:13 Patient Visited By: Alysia Penna  mm34  12:27 Patient Visited By: Lucianne Muss, Vinicius                              vl1  .  Interventions:  11:51 Driver's License Scanned into Chart                             sb36  12:14 Demo Sheet Scanned into Chart                                   oag  .  Outcome:  13:04 Discharge ordered by MD.                                        ZO10  13:20 Discharged to home ambulatory. Condition: improved. Pain:        ks3        Denies pain. Discharge instructions given to patient,        Instructed on follow up and referral plans. medication usage,        Demonstrated understanding of instructions, medications.        Discharge Assessment: Patient awake, alert and oriented x 3. No        cognitive and/or functional deficits noted. Patient verbalized        understanding of disposition instructions. Chart Status Nursing        note complete and electronically signed.  13:20 Patient left the ED.                                            ks3  .  Signatures:  Barrie Folk                            RN   Marlowe Kays                         RN   ks3  Alysia Penna                           MD   mm34  .  Name:Melchior, Charese  [id number]  [id number]  Page 2 of 3  %%PAGE  .  Name: [name] [name]  MRN: [id number]  Age: 2 yrs  Sex: Female  DOB: Apr 23, 1970  Arrival Date: 06/30/2017  Arrival Time: 11:51  Account#: [id number]  Bed E-Eye  PCP: [name]  Chief Complaint:  [name] [name] @ 1300 for Toothache  .  Tyrone Apple                         Sec  oag  Chilton Si, Tresea Mall, Vinicius                          DMD  vl1  Joselyn Glassman                              sb36  .  .  .  .  .  .  .  .  .  .  .  .  .  .  .  .  .  .  .  .  .  .  .  .  .  .  .  .  .  .  .  .  .  .  .  .  .  .  .  Name:[name] [name]  [id number]  [id number]  Page 3 of 3  .  %%END

## 2017-06-30 NOTE — ED Provider Notes (Signed)
Your patient [name] [name] was seen in the emergency room on 06/30/2017      Created By  [employer] on 06/30/2017 at 11:54 AM    Electronically Signed By [name] MD -[facility] 4B- on 07/18/2017 at 07:20 PM

## 2017-07-01 ENCOUNTER — Ambulatory Visit: Admit: 2017-07-01 | Payer: Commercial Managed Care - HMO

## 2017-07-01 ENCOUNTER — Ambulatory Visit: Admitting: Internal Medicine

## 2017-07-01 ENCOUNTER — Ambulatory Visit

## 2017-07-01 DIAGNOSIS — K1379 Other lesions of oral mucosa: Principal | ICD-10-CM

## 2017-07-01 DIAGNOSIS — ? CONTACT W EXPS OTH COMMUN: Secondary | ICD-10-CM

## 2017-07-01 NOTE — Progress Notes (Signed)
[facility] July 01, 2017  [address]   [place]  [place] [address]  Main: [phone]  Fax: [phone]  Patient Portal: [email or web address]  [address]  [place], [place]  [address]          DOB: November 25, 1970           MR#: [id number]    To Whom It May Concern:    Kamica Rollinson is a patient under my care in the General Medicine clinic at [facility]. This patient was seen in clinic on 07/01/2017 and should be excused from work until after a dental procedure preferably to be done as soon as possible.    Thank you.      Sincerely,       Hinton Lovely MD -[facility]-  [facility]             Created By Hinton Lovely MD -[facility]- on 07/01/2017 at 04:12 PM    Electronically Signed By Hinton Lovely MD -[facility]- on 07/01/2017 at 04:12 PM

## 2017-07-01 NOTE — Progress Notes (Signed)
General Medicine Visit - Resident note with preceptor addendum  Service Due by Standard Protocol Rules: PATPORTALPIN, PAP SMEAR.    .  Initial Screening   * [place]: Clark (Katrina)  Ht: 64 in.  Wt: 232.6 lbs.   BMI: 40.07  Temp: 97.4 deg F.     BP (Initial [place] Screening): 104 / 75      BP (Rechecked, Actionable): 140 /   75 mmHg   HR: 84     Health Mgmt materials? Weight Education Handout for high BMI  .  Med List: PRINTED by [place] for patient   Chronic Pain Assessment: Does pt experience chronic pain ? NO  Smoking Assessment: Tobacco use? never smoker  .  Self Mgmt materials provided? Printed handout: Seasonal Allergies  .  [name]  Falls Risk Assessment:   In the past year, have you ... Had no falls  Difficulty with balance? NO  Need assistance with ambulation while here? NO  .  Behavioral Health Tools:   PHQ2 Screening Score 0  .  Comments: PT NEED REFEREL TO GO TO THE DENTIST FOR TOOTH PAIN.  .  .  **Resident Note**  .  * Preceptor: Financial trader Ree Kida)  CC: Tooth pain  .  HPI: Patient had tooth pain over right jaw since Friday.  The pain was dull   and throbbing, at times it is sharp and is referred to ear. She went to ur  gent care then later ED at [facility] due to 10/10 pain associated with subjecti  ve fevers. She was given oral antibotics (unclear which one) and a nerve bl  ock with improvement of symptoms. She states that she had similar pain on t  his area a few months ago but did not see her dentist. The next available a  ppointment would be mid-March with her usual dentist.  .  Of note the filling on tooth #30 that fell out, and is currently filled by   a dental cement she bought at a pharmacy. She denies dysphagia, change in v  oice, or trismus.  .  .  .  .  .  .  .  .  Review of Systems: see HPI  .  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  [name]  MEDICATIONS:   PAST MEDICINES (prior to today's visit):  MULTIVITAMINS ORAL CAPSULE (MULTIPLE VITAMIN) Take one capsule by mouth onc  e daily; Route: ORAL  IMITREX 50 MG ORAL TABLET (SUMATRIPTAN SUCCINATE) Take one tablet at onset   of migraine. If initial dose was partially effective or headache recurs, [place]  y repeat a dose after =2 hours; Route: ORAL  .  .  .  ALLERGIES:   PAST ALLERGIES (prior to today's visit):  PENICILLIN (Critical)  * CHLOROQUINE (Moderate)  * INJECTAFER (Moderate)  .  [name]  [name]  DIRECTIVES:   .  .  .  Vitals:   BP: 140 / 75 mmHg Ht: 64 in.  Wt: 232.6 lbs.  BMI (in-lb) 40.07  Temp: 97.4deg F.   Pulse Rate: 84 bpm  .  .  Additional PE: HEENT: filling loosely in place over right lower premolar, s  hotty cervical lymhpdenopathy, no abscess or asymmetry of the neck  .  [name]  Assessment /T/ Plan:   Natascha Edmonds is a 47 year old woman here with toothache.  .  # Dental Infection  - Refer to [facility] tomorrow, walk in  - PRN Tylenol and Ibuprofen for pain  - Work note  - RTC if pain worsening or develops fevers, may need CT  .  .  .  * Resident Signature (.sign): ......................................Marland KitchenLarence Penning  o MD -[facility]-  July 01, 2017 3:59 PM  .  ORDERS:  Est Level 3 [[id number]]  .  [name]  **Preceptor Note**  Resident: HO Jillyn Hidden)  Problem Urgent Care  .  [name]  Subjective   Patient is a 47 Years Old  Female who presents to clinic dental pain  no s/s systemic infectino  needs urgent dental eeval given instructions to pt    I discussed the patient with Dr. Anselm Jungling Jillyn Hidden) in a routine precepting encounte  r.  I agree with the patient's diagnosis and management.    .  .  Objective   see resident's note for details.    .  Plan   See resident note for details, Follow-up per resident note  .  Seen On Team (Floor):  6A  .  .  .  Patient Care Plan  .  .  .  Immunization Worksheet 2016   .  .  [name]  Electronically Signed by Ruffin Frederick, MD on 07/03/2017 at 8:15 AM  ________________________________________________________________________

## 2017-07-01 NOTE — Progress Notes (Signed)
General Medicine Visit - Resident note with preceptor addendum  Service Due by Standard Protocol Rules: PATPORTALPIN, PAP SMEAR.      Initial Screening   * [place]: Clark (Katrina)  Ht: 64 in.  Wt: 232.6 lbs.   BMI: 40.07  Temp: 97.4 deg F.     BP (Initial [place] Screening): 104 / 75      BP (Rechecked, Actionable): 140 / 75 mmHg   HR: 84     Health Mgmt materials? Weight Education Handout for high BMI    Med List: PRINTED by [place] for patient   Chronic Pain Assessment: Does pt experience chronic pain ? NO  Smoking Assessment: Tobacco use? never smoker    Self Mgmt materials provided? Printed handout: Seasonal Allergies      Falls Risk Assessment:   In the past year, have you ... Had no falls  Difficulty with balance? NO  Need assistance with ambulation while here? NO    Behavioral Health Tools:   PHQ2 Screening Score 0    Comments: PT NEED REFEREL TO GO TO THE DENTIST FOR TOOTH PAIN.      **Resident Note**    * Preceptor: Naggar Ree Kida)  CC: Tooth pain    HPI: Patient had tooth pain over right jaw since Friday.  The pain was dull and throbbing, at times it is sharp and is referred to ear. She went to urgent care then later ED at [facility] due to 10/10 pain associated with subjective fevers. She was given oral antibotics (unclear which one) and a nerve block with improvement of symptoms. She states that she had similar pain on this area a few months ago but did not see her dentist. The next available appointment would be mid-March with her usual dentist.    Of note the filling on tooth #30 that fell out, and is currently filled by a dental cement she bought at a pharmacy. She denies dysphagia, change in voice, or trismus.                  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)      MAMMOGRAM, ABNORMAL, BILATERAL (ICD-793.80) (ICD10-R92.8)  S/P TOTAL ABDOMINAL HYSTERECTOMY (ICD-V88.01) (ICD10-Z90.710)      UTERINE FIBROIDS (ICD-218.9) (ICD10-D25.9)      IRON 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 If initial dose was partially effective or headache recurs, may repeat a dose after =2 hours; Route: ORAL           ALLERGIES:   PAST ALLERGIES (prior to today's visit):  PENICILLIN (Critical)  * CHLOROQUINE (Moderate)  * INJECTAFER (Moderate)           DIRECTIVES:         Vitals:  BP: 140 / 75 mmHg Ht: 64 in.  Wt: 232.6 lbs.  BMI (in-lb) 40.07  Temp: 97.4deg F.   Pulse Rate: 84 bpm      Additional PE: HEENT: filling loosely in place over right lower premolar, shotty cervical lymhpdenopathy, no abscess or asymmetry of the neck      Assessment & Plan:   [name] [name] is a 47 year old woman here with toothache.    # Dental Infection  - Refer to [facility] tomorrow, walk in  - PRN Tylenol and Ibuprofen for pain  - Work note  - RTC if pain worsening or develops fevers, may need CT        * Resident Signature (.sign): ......................................Marland KitchenHinton Lovely MD -[facility]-  July 01, 2017 3:59 PM       ORDERS:  Est Level 3 [[id number]]      **Preceptor Note**     Resident: HO Jillyn Hidden)  Problem Urgent Care      Subjective   Patient is a 47 Years Old Female who presents to clinic dental pain  no s/s systemic  infectino  needs urgent dental eeval given instructions to pt    I discussed the patient with Dr. Anselm Jungling Jillyn Hidden) in a routine precepting encounter.  I agree with the patient's diagnosis and management.        Objective   see resident's note for details.      Plan   See resident note for details, Follow-up per resident note    Seen On Team (Floor):  6A        Patient Care Plan        Immunization Worksheet 2016           Created By Raiford Simmonds on 07/01/2017 at 03:54 PM    Electronically Signed By Ruffin Frederick MD on 07/03/2017 at 08:15 AM

## 2017-07-02 ENCOUNTER — Ambulatory Visit

## 2017-07-02 NOTE — Progress Notes (Signed)
[facility]  [address]  [place] [address]  Main: [phone]  Fax: [phone]  Patient Portal: [email or web address]        July 02, 2017            MRN: [id number]            DOB: 1970/06/04   [name] [name]   [address]   [place] [address]      I am writing to let you know that the following appointments have been made for you.       Neurology  [address]  Your appointment is scheduled for 08/01/2017 at 10:00 AM  You are scheduled to see Dr. Herbie Drape MD  If you need to make any changes, please call [phone]      For the sleep study, I call them on 06/18. Someone will reach you for the appointment. Hopefully, someone already call you, but if tey don't, the clinic's number is [phone]. Please feel free to call them. Thank you!             Sincerely,         [name]  [facility]          Created By [name] on 07/02/2017 at 06:03 PM    Electronically Signed By [name] on 07/02/2017 at 06:07 PM

## 2017-07-17 IMAGING — US US PELVIS COMPLETE
1 series · 14 of 25 positions shown · non-contrast
Comparison: Outside pelvic sonogram from 05/27/2014.

CLINICAL DATA: 44-year-old female with a history of uterine
fibroids and menorrhagia. LMP 11/03/2014, day 9 of menstrual cycle.

EXAM:
TRANSABDOMINAL AND TRANSVAGINAL ULTRASOUND OF PELVIS
TECHNIQUE: Both transabdominal and transvaginal ultrasound examinations of the
pelvis were performed. Transabdominal technique was performed for
global imaging of the pelvis including uterus, ovaries, adnexal
regions, and pelvic cul-de-sac. It was necessary to proceed with
endovaginal exam following the transabdominal exam to visualize the
endometrium, myometrium, ovaries and adnexa.

[Series 1: us pelvis complete · 14 of 101 slices shown]
[im 1/101]
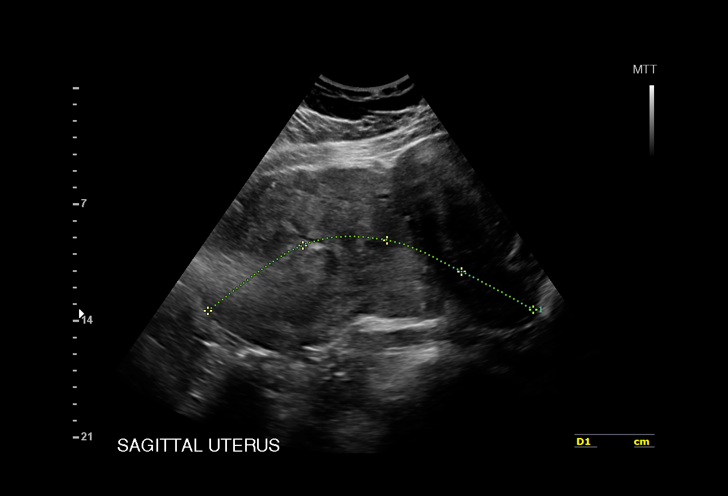
[im 9/101]
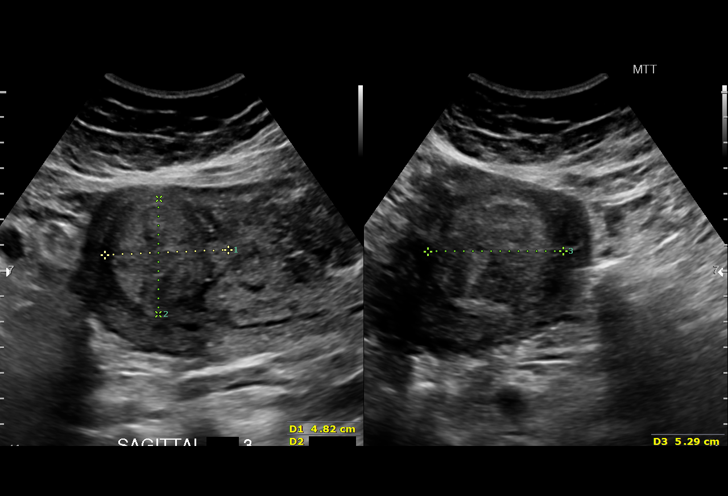
[im 17/101]
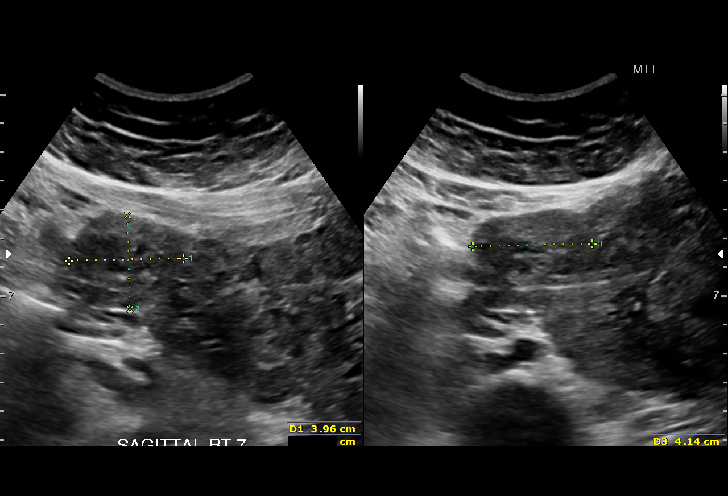
[im 26/101]
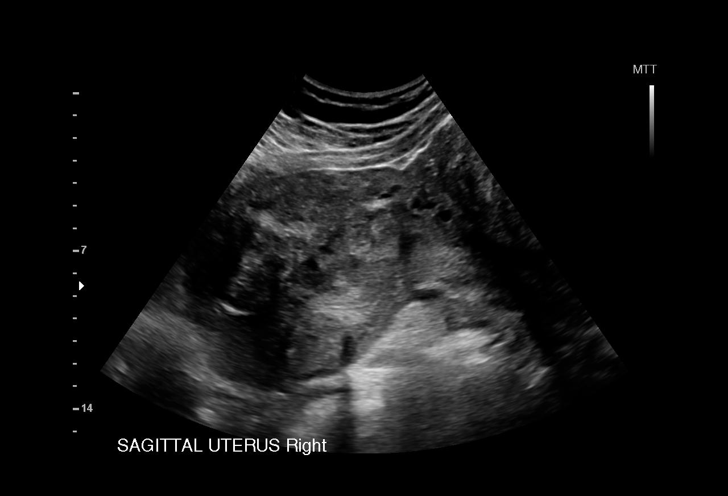
[im 34/101]
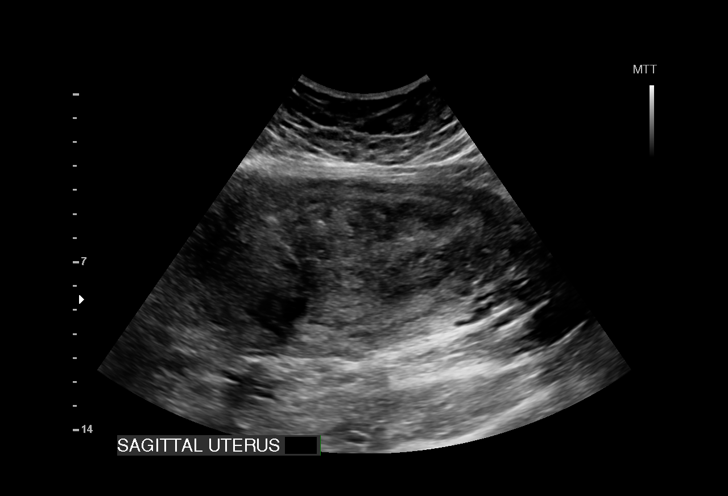
[im 38/101]
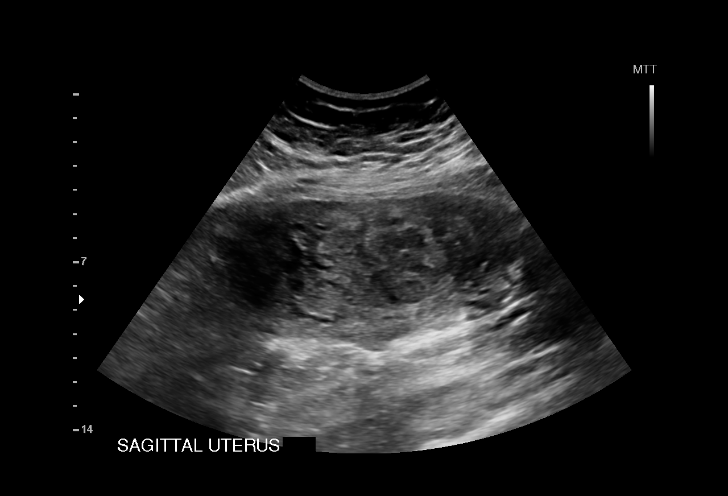
[im 46/101]
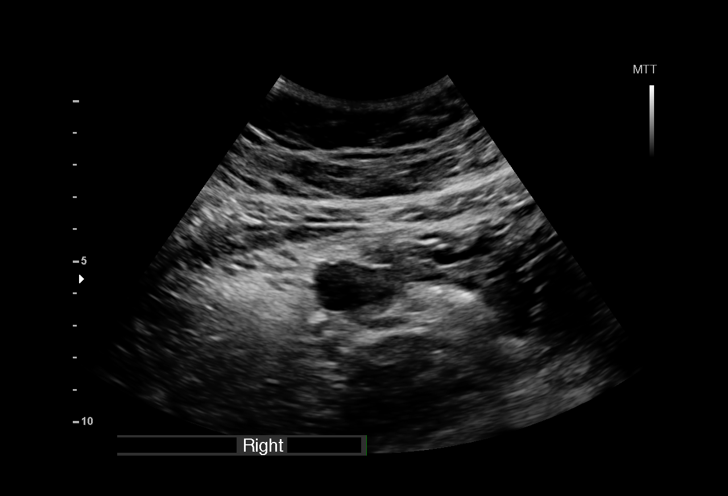
[im 55/101]
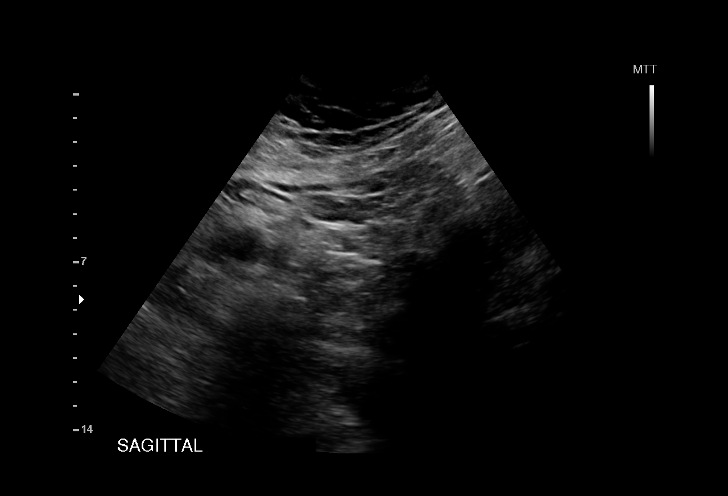
[im 63/101]
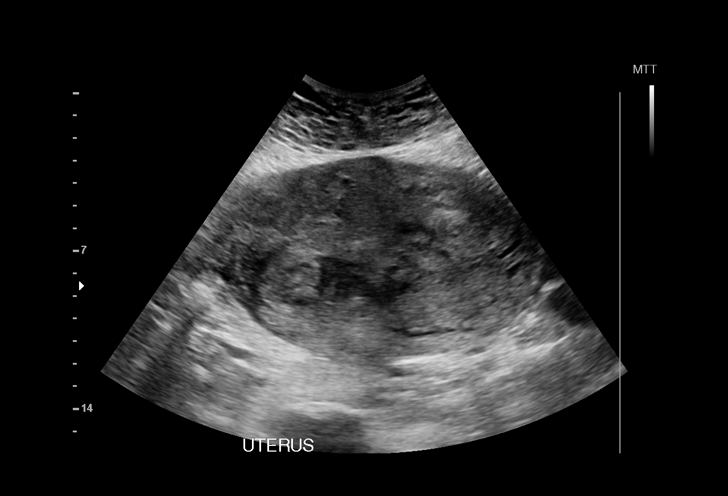
[im 67/101]
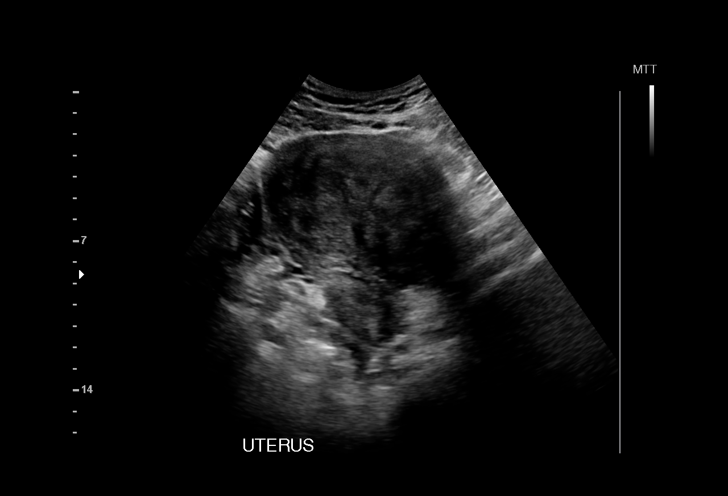
[im 76/101]
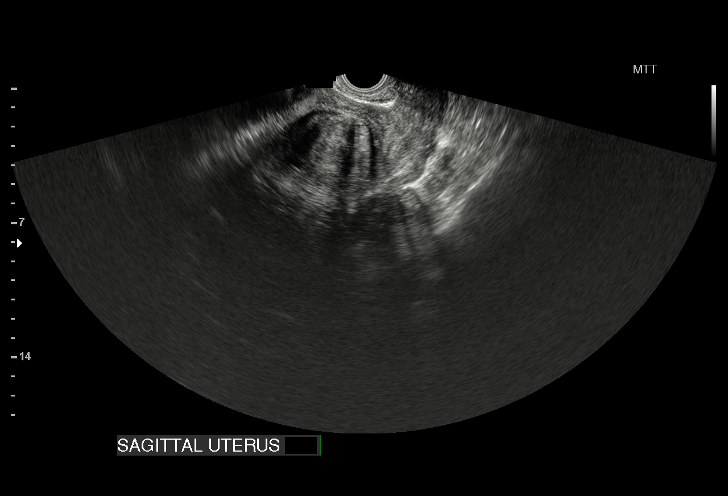
[im 84/101]
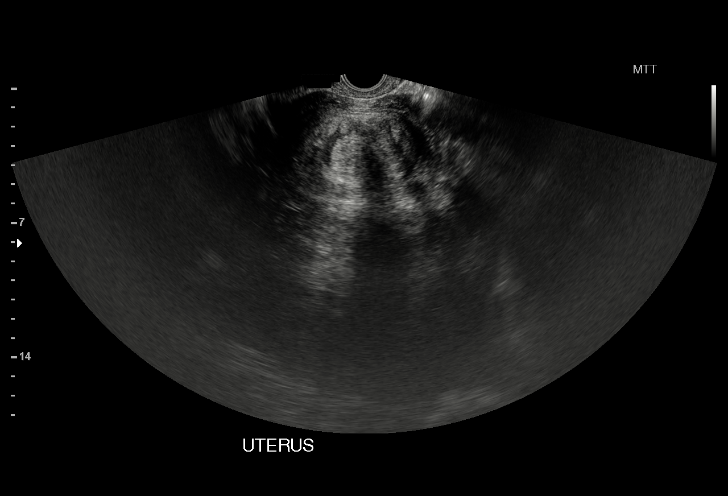
[im 92/101]
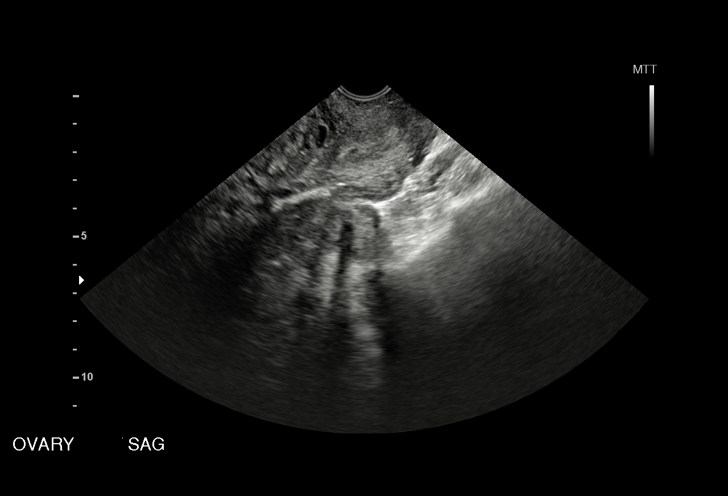
[im 101/101]
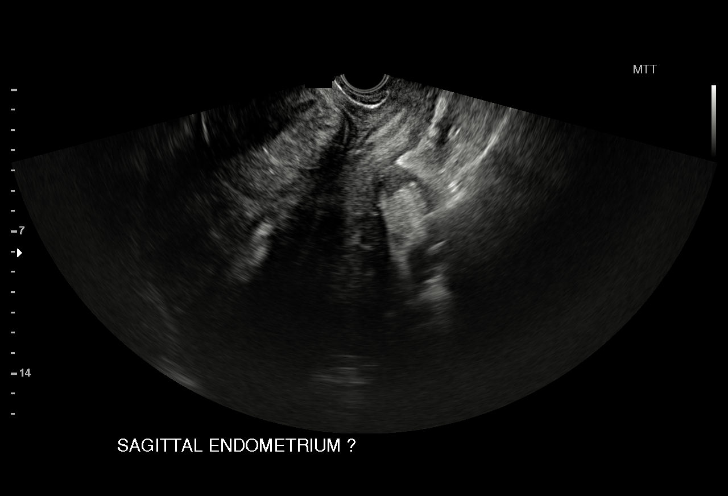

[14 of 25 positions shown; findings below may reference images not displayed]

FINDINGS: Uterus

Measurements: 21.8 x 9.7 x 15.9 cm. The anteverted uterus is
prominently enlarged by multiple fibroids, with representative
fibroids as follows:

- left anterior lower uterine body subserosal 7.3 x 8.0 x 7.5 cm
fibroid

- right mid uterine body intramural 5.4 x 4.0 x 5.0 cm fibroid with
possible small 20% submucosal component

- right anterior uterine body 3.7 x 2.6 x 3.7 cm fibroid with
approximately 40% submucosal component

Endometrium

Endometrial visualization is limited by the mass-effect/distortion
by surrounding fibroids. Estimated bilayer endometrial thickness is
0.8 cm, within normal limits. No focal endometrial lesion or
endometrial cavity fluid are demonstrated.

Right ovary

Measurements: 2.5 x 1.6 x 3.6 cm. Normal appearance/no adnexal mass.

Left ovary

Measurements: 2.8 x 1.8 x 3.7 cm. There is a questionable 2.7 x
x 2.5 cm hyperechoic focus at the posterior margin of the left ovary
on the transvaginal images, which could represent a dermoid.

Other findings

No abnormal free fluid in the pelvis.
IMPRESSION: 1. Prominently enlarged myomatous uterus with right uterine
submucosal fibroids.
2. Limited endometrial visualization, with no endometrial
abnormality detected.
3. Questionable 2.7 cm hyperechoic focus at the posterior left ovary
on the transvaginal images, which could represent a left ovarian
dermoid. A pelvic MRI with and without intravenous contrast could be
obtained to evaluate the fibroids and this questionable left ovarian
structure, if clinically warranted. Otherwise, recommend follow-up
pelvic sonogram in 6 months to reassess this left ovarian focus.

## 2017-07-18 ENCOUNTER — Ambulatory Visit

## 2017-08-01 ENCOUNTER — Ambulatory Visit: Admitting: Neurology

## 2017-08-01 ENCOUNTER — Ambulatory Visit

## 2017-08-01 NOTE — Progress Notes (Signed)
* * *        **  [name]**    --- ---    35 Y old Female, DOB: [birth year]    [address], [place], [place] [address]    Home: [phone]    Provider: Herbie Drape        * * *    Telephone Encounter    ---    Answered by   Herbie Drape  Date: 08/01/2017         Time: 10:33 AM    Message                      Truddie Hidden,            I would like to request that this patient be blocked from rescheduling with me. She has now missed two new patient appointments with me and has rescheduled (mid-appointment) to another date in August. If she wants to be seen here, I would ask that she be scheduled into a resident slot or with another provider.            Thanks,      Konrad Dolores        --- ---            Action Taken                      Dr. Claudius Sis            Unfortunately there isnt a way in the system to block a patient from making an appointment. It looks like she has scheduled an appt in August with a different provider. I will call her a few days prior to confirm that appt and remind her of the importance of keeping that appt. I will let her know if she is unable to come she should cancel at least24 hr in advance.      Lawson,Stacey  08/15/2017 7:45:51 AM >                     * * *                ---          * * *          Patient: [name], [name] DOB: [birth year] Provider: Herbie Drape  08/01/2017    ---    Note generated by eClinicalWorks EMR/PM Software ([email or web address])

## 2017-08-16 ENCOUNTER — Ambulatory Visit: Admit: 2017-08-16 | Payer: Commercial Managed Care - HMO

## 2017-08-16 ENCOUNTER — Ambulatory Visit

## 2017-08-16 ENCOUNTER — Ambulatory Visit: Admitting: Neurology

## 2017-08-16 DIAGNOSIS — M62838 Other muscle spasm: Secondary | ICD-10-CM

## 2017-08-16 DIAGNOSIS — G43009 Migraine without aura, not intractable, without status migrainosus: Principal | ICD-10-CM

## 2017-08-16 DIAGNOSIS — G4440 Drug-induced headache, not elsewhere classified, not intractable: Secondary | ICD-10-CM

## 2017-08-16 DIAGNOSIS — G44229 Chronic tension-type headache, not intractable: Secondary | ICD-10-CM

## 2017-08-16 LAB — HX CHEM-PANELS
HX ANION GAP: 4 (ref 3–14)
HX BLOOD UREA NITROGEN: 4 mg/dL — ABNORMAL LOW (ref 6–24)
HX CHLORIDE (CL): 105 meq/L (ref 98–110)
HX CO2: 29 meq/L (ref 20–30)
HX CREATININE (CR): 0.68 mg/dL (ref 0.57–1.30)
HX GFR, AFRICAN AMERICAN: 122 mL/min/{1.73_m2}
HX GFR, NON-AFRICAN AMERICAN: 105 mL/min/{1.73_m2}
HX GLUCOSE: 84 mg/dL (ref 70–139)
HX POTASSIUM (K): 3.7 meq/L (ref 3.6–5.1)
HX SODIUM (NA): 138 meq/L (ref 135–145)

## 2017-08-16 LAB — HX CHEM-LFT
HX ALANINE AMINOTRANSFERASE (ALT/SGPT): 42 IU/L (ref 0–54)
HX ALKALINE PHOSPHATASE (ALK): 89 IU/L (ref 40–130)
HX ASPARTATE AMINOTRANFERASE (AST/SGOT): 30 IU/L (ref 10–42)
HX BILIRUBIN, TOTAL: 0.5 mg/dL (ref 0.2–1.1)

## 2017-08-16 LAB — HX DIABETES: HX GLUCOSE: 84 mg/dL (ref 70–139)

## 2017-08-16 LAB — HX CHEM-OTHER
HX ALBUMIN: 4.3 g/dL (ref 3.4–4.8)
HX CALCIUM (CA): 9.3 mg/dL (ref 8.5–10.5)
HX PROTEIN, TOTAL: 7.6 g/dL (ref 6.0–8.3)

## 2017-08-16 MED ORDER — Amitriptyline HCl: 25 | 30 | Freq: Every day | 5 refills | 0 days
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
## 2017-08-18 ENCOUNTER — Ambulatory Visit

## 2017-08-28 ENCOUNTER — Ambulatory Visit

## 2017-11-01 ENCOUNTER — Ambulatory Visit: Admitting: Neurology

## 2017-11-01 NOTE — Progress Notes (Signed)
* * *        **  [name] [name]**    --- ---    46 Y old Female, DOB: [birth year]    [address], [place], [place] [address]    Home: [phone]    Provider: Toma Aran        * * *    Telephone Encounter    ---    Answered by   Loel Lofty  Date: 11/01/2017         Time: 11:59 AM    Caller   PATIENT    --- ---            Reason   MRI            Message                      Good Afternoon,      Patient is calling to schedule MRI in [place] [facility]) , and they are asking that the referral come directly from Dr. Donnajean Lopes. Please contact patient at [phone] once referral has been sent, thank you.                Action Taken                      Benoit,Kyara  11/01/2017 12:03:01 PM >       Pavielle Biggar  11/04/2017 8:37:47 AM > Enrique Sack, I put the request for MRI brain w and w/o in again under virtual visits.  Can you send in the referral to [facility] in [place]?  Can you let the patient know when it is done? Thanks.                    * * *              * * *        ---        Reason for Appointment    ---      1\. MRI    ---      Assessments    ---    1\. Migraine without aura and without status migrainosus, not intractable -  G43.009    ---      Treatment    ---       **1\. Migraine without aura and without status migrainosus, not  intractable**    _IMAGING: MR HEAD W + WO CONTRAST_    ---          * * *           Patient: [name] [name] DOB: 03/03/1970 Provider: Toma Aran  11/01/2017    ---    Note generated by eClinicalWorks EMR/PM Software ([email or web address])

## 2017-11-04 ENCOUNTER — Ambulatory Visit: Admitting: Neurology

## 2017-11-04 NOTE — Progress Notes (Signed)
* * *        **  [name]**    --- ---    67 Y old Female, DOB: 03/21/1970    [address], [place], [place] [address]    Home: [phone]    Provider: Toma Aran        * * *    Telephone Encounter    ---    Answered by   Magdalene Patricia  Date: 11/04/2017         Time: 12:02 PM    Caller   [facility] [place]    --- ---            Reason   PA            Message                      Good Morning,             stella stated that there is no PA for the pt. MRI order. Francena Hanly can be reached at, [phone] and can speak with anyone.             Thank you,                             Action Taken                      [facility]  11/04/2017 12:04:23 PM >             Dolor,Kendra  11/04/2017 12:36:55 PM > [id number] APPROVED dec. 20TH 2019                    * * *                ---          * * *          Patient: Tolson, Aileana DOB: 07/09/1970 Provider: Toma Aran  11/04/2017    ---    Note generated by eClinicalWorks EMR/PM Software ([email or web address])

## 2017-11-07 ENCOUNTER — Ambulatory Visit: Admitting: Neurology

## 2017-12-20 ENCOUNTER — Ambulatory Visit: Admit: 2017-12-20 | Payer: Commercial Managed Care - HMO

## 2017-12-20 ENCOUNTER — Ambulatory Visit

## 2017-12-20 ENCOUNTER — Ambulatory Visit: Admitting: Neurology

## 2017-12-20 DIAGNOSIS — G4440 Drug-induced headache, not elsewhere classified, not intractable: Secondary | ICD-10-CM

## 2017-12-20 DIAGNOSIS — M62838 Other muscle spasm: Secondary | ICD-10-CM

## 2017-12-20 DIAGNOSIS — G43009 Migraine without aura, not intractable, without status migrainosus: Principal | ICD-10-CM

## 2017-12-20 DIAGNOSIS — G44229 Chronic tension-type headache, not intractable: Secondary | ICD-10-CM
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
## 2018-03-05 ENCOUNTER — Ambulatory Visit: Admitting: Neurology

## 2018-03-05 ENCOUNTER — Ambulatory Visit

## 2018-03-05 NOTE — Telephone Encounter (Signed)
 Call Details:   Patient PCP = [name] MD -[facility] 4B-  [name] [name] (Patient) called on March 05, 2018 1:39 PM.  Message taken by: Bartholomew Crews  Primary call-back number: [phone]    Secondary call-back number: () -    Call Reason(s): Message/Call-Back      ** MESSAGE / CALL-BACK.  Regarding: Pt would like to request for a letter from PCP regarding her migraines. Please contact the pt regarding this matter    ---------- ---------- ---------- ---------- ---------- ----------       RESPONSE/ORDERS:  spoke with pt sheis requesting a note that she can give to her  boss stating she has migraines - states she "just wants her boss to have it on file"    was seen by  Neurology  12-20-17 for  headache - pt is going to reach out to them also   pt is also requesting a rill on inatrex - would like to have it on hand   okay to send  refill on imatrex?  pt pharm  [phone]  ......................................Marland KitchenTod Persia, RN  March 05, 2018 2:08 PM    She had self dc'd since it caused upset stomach per last neuro note - if she wants refill that would be fine. I generated the letter and will sign it Monday and leave it with Brad. Thanks!  .....................................Marland KitchenMarland KitchenJanalyn Shy MD -[facility] 4B-  March 07, 2018 6:23 PM    noted  ......................................Marland KitchenTod Persia, RN  March 10, 2018 8:32 AM    signed letter and left with [facility].   .....................................Marland KitchenMarland KitchenJanalyn Shy MD -[facility] 4B-  March 10, 2018 1:20 PM           ORDERS/PROBS/MEDS/ALL     Problems:   MOUTH PAIN (ICD-528.9) [phone])  FATIGUE (ICD-780.79) (ICD10-R53.83)  SNORING (ICD-786.09) (ICD10-R06.83)  MIGRAINE (ICD-346.90) (ICD10-G43.909)  BACK PAIN (ICD-724.5) (ICD10-M54.9)  KNEE PAIN, RIGHT (ICD-719.46) (ICD10-M25.561)  OBESITY, BMI 35-39.9, ADULT (ICD-278.00) (ICD10-E66.9)  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) (ICD10-B18.1)  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)       MAMMOGRAM, ABNORMAL, BILATERAL (ICD-793.80) (ICD10-R92.8)  S/P TOTAL ABDOMINAL HYSTERECTOMY (ICD-V88.01) (ICD10-Z90.710)      UTERINE FIBROIDS (ICD-218.9) (ICD10-D25.9)      IRON DEFICIENCY 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) (ICD10-D50.9)      UMBILICAL HERNIA (ICD-553.1) (IDU37-D57.9)  ANNUAL EXAM - SEPT 2017 (ICD-V70.0) (ICD10-Z00.00)    Allergies:   PENICILLIN (Critical)  * CHLOROQUINE (Moderate)  * INJECTAFER (Moderate)    Meds (prior to this call):   MULTIVITAMINS ORAL CAPSULE (MULTIPLE VITAMIN) Take one capsule by mouth once daily; Route: ORAL  IMITREX 50 MG ORAL TABLET (SUMATRIPTAN SUCCINATE) Take one tablet at onset of migraine. If initial dose was partially effective or headache recurs, may repeat a dose after =2 hours; Route: ORAL            Created By Bartholomew Crews on 03/05/2018 at 01:39 PM    Electronically Signed By [name] MD -[facility] 4B- on 03/10/2018 at 01:20 PM

## 2018-03-05 NOTE — Progress Notes (Signed)
* * *      **  [name] [name]**    ------    41 Y old Female, DOB: March 28, 1970    [address], [place], [place] [address]    Home: [phone]    Provider: Toma Aran        * * *    Telephone Encounter    ---    Answered by  Wilburt Finlay Date: 03/05/2018       Time: 01:45 PM    Caller  [name] [name], pt    ------            Reason  Medical Letter            Message                     Good Afternoon,       [name] is a patient of Dr. Donnajean Lopes whom is calling in to obtain a medical letter stating she has been seen by Dr. Donnajean Lopes for having migraine issues. [name] needs to provide this letter to her employer by Monday, February 24th the latest. She will be calling back to update message with a fax number. In the meantime, if you have any further questions, [name] can be reached directly at, [phone]. Thank you!                Action Taken                     Gilles,Guerbie  03/05/2018 1:45:59 PM >      Xee Hollman  03/05/2018 3:39:48 PM > Enrique Sack, can you print out this letter and leave it for me to sign?  Thanks.            March 05, 2018            To Whom It May Concern:            [name] (DOB 26-Jul-1970) is a patient under my care for chronic daily headaches.  She has muscle spasm in her neck which may be the trigger for her headaches.              Sincerely,            Toma Aran, MD      [facility]  03/06/2018 8:17:28 AM > all set                    * * *                ---          * * *         Patient: [name], [name] DOB: [birth year] Provider: Toma Aran  03/05/2018    ---    Note generated by eClinicalWorks EMR/PM Software ([email or web address])

## 2018-03-07 ENCOUNTER — Ambulatory Visit

## 2018-03-07 NOTE — Progress Notes (Signed)
 [facility] March 10, 2018  [address]   [place]  [place] [address]  Main: [phone]  Fax: [phone]  Patient Portal: [email or web address]                     03/10/2018    RE: [name] [name]  [address]  [place], [place]  [address]          MR#: [id number]          DOB: November 24, 1970    To Whom It May Concern:    [name] [name] is a patient under my care in the General Medicine clinic at [facility]. She has been evaluated by Dr. Donnajean Lopes at [facility] Neurology and carries a diagnosis of mixed etiology headache including migraines without aura.         Sincerely,          [name] MD -[facility] 4B-  [facility]          Created By [name] MD -[facility] 4B- on 03/07/2018 at 06:17 PM    Electronically Signed By [name] MD -[facility] 4B- on 03/10/2018 at 01:15 PM

## 2018-03-10 ENCOUNTER — Ambulatory Visit

## 2018-03-10 MED ORDER — SUMATRIPTAN SUCCINATE: 1 | 60 | 2 refills | 0 days | Status: AC

## 2018-03-10 NOTE — Telephone Encounter (Signed)
 Prescription Request Completed: Complete    Call Details:   Patient PCP = [name] MD -[facility] 4B-  [name] [name] (Patient) called on March 10, 2018 1:54 PM.  Message taken by: [name]  Primary call-back number: () -    Secondary call-back number: () -    Call Reason(s): Prescription        ** REQUESTING A PRESCRIPTION OR REFILL.  [facility]: [facility]  [address] [place]  Phone: [phone]  Fax: [phone]    Provide Prescription to Pharmacy  Medications to refill:  Imitrex 50 mg oral tablet : Take one tablet at onset of migraine. If initial dose was partially effective or headache recurs; may repeat a dose after =2 hours    ---------- ---------- ---------- ---------- ---------- ----------     Extra Medication Info:  HI Dr. Modesto Charon,  Pt want the refill. Thank you!  ......................................Marland KitchenMertha Finders  March 10, 2018 1:55 PM        RESPONSE/ORDERS:  sent .....................................Marland KitchenMarland KitchenJanalyn Shy MD -[facility] 4B-  March 10, 2018 4:21 PM                 ORDERS/PROBS/MEDS/ALL     Problems:   MOUTH PAIN (ICD-528.9) [phone])  FATIGUE (ICD-780.79) (ICD10-R53.83)  SNORING (ICD-786.09) (ICD10-R06.83)  MIGRAINE (ICD-346.90) (ICD10-G43.909)  BACK PAIN (ICD-724.5) (ICD10-M54.9)  KNEE PAIN, RIGHT (ICD-719.46) (ICD10-M25.561)  OBESITY, BMI 35-39.9, ADULT (ICD-278.00) (ICD10-E66.9)  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) (ICD10-B18.1)  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)      MAMMOGRAM, ABNORMAL, BILATERAL (ICD-793.80) (ICD10-R92.8)  S/P TOTAL ABDOMINAL HYSTERECTOMY (ICD-V88.01) (ICD10-Z90.710)      UTERINE FIBROIDS (ICD-218.9) (ICD10-D25.9)      IRON DEFICIENCY 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) (ICD10-D50.9)      UMBILICAL HERNIA (ICD-553.1) (XQK20-S13.9)  ANNUAL EXAM - SEPT 2017 (ICD-V70.0) (ICD10-Z00.00)    Allergies:   PENICILLIN (Critical)  * CHLOROQUINE (Moderate)  * INJECTAFER (Moderate)    Meds (prior to this call):   MULTIVITAMINS  ORAL CAPSULE (MULTIPLE VITAMIN) Take one capsule by mouth once daily; Route: ORAL  IMITREX 50 MG ORAL TABLET (SUMATRIPTAN SUCCINATE) Take one tablet at onset of migraine. If initial dose was partially effective or headache recurs, may repeat a dose after =2 hours; Route: ORAL    Changes to Meds (this update):   Rx of IMITREX 50 MG ORAL TABLET (SUMATRIPTAN SUCCINATE) Take one tablet at onset of migraine. If initial dose was partially effective or headache recurs, may repeat a dose after =2 hours; Route: ORAL  #60[Tablet] x 2;  Signed;  Entered by: [name] MD -[facility] 4B-;  Authorized by: [name] MD -[facility] 4B-;  Method used: Electronically to [facility] [address] [place], [place], [place]  [address], Ph: [phone], Fax: [phone]; Note to Pharmacy: Route: ORAL;          Created By [name] on 03/10/2018 at 01:54 PM    Electronically Signed By [name] MD -[facility] 4B- on 03/10/2018 at 04:22 PM

## 2018-03-13 ENCOUNTER — Ambulatory Visit: Admitting: Neurology

## 2018-03-13 ENCOUNTER — Ambulatory Visit

## 2018-03-13 NOTE — Telephone Encounter (Signed)
 Call Details:   Patient PCP = [name] MD -[facility] 4B-  [name] [name] (Patient) called on March 13, 2018 2:54 PM.  Message taken by: Bartholomew Crews  Primary call-back number: [phone]    Secondary call-back number: () -    Call Reason(s): Message/Call-Back      ** MESSAGE / CALL-BACK.  Regarding: Pt request for the letter about her migraines to be mailed home to her.     ---------- ---------- ---------- ---------- ---------- ----------       RESPONSE/ORDERS:    Printed and mailed. Try to call pt and let her know, but no one pick up. Unable to [facility]. Thank you!  ......................................Marland KitchenMertha Finders  March 15, 2018 9:44 AM               ORDERS/PROBS/MEDS/ALL     Problems:   MOUTH PAIN (ICD-528.9) [phone])  FATIGUE (ICD-780.79) (ICD10-R53.83)  SNORING (ICD-786.09) (ICD10-R06.83)  MIGRAINE (ICD-346.90) (ICD10-G43.909)  BACK PAIN (ICD-724.5) (ICD10-M54.9)  KNEE PAIN, RIGHT (ICD-719.46) (ICD10-M25.561)  OBESITY, BMI 35-39.9, ADULT (ICD-278.00) (ICD10-E66.9)  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) (ICD10-B18.1)  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)      MAMMOGRAM, ABNORMAL, BILATERAL (ICD-793.80) (ICD10-R92.8)  S/P TOTAL ABDOMINAL HYSTERECTOMY (ICD-V88.01) (ICD10-Z90.710)      UTERINE FIBROIDS (ICD-218.9) (ICD10-D25.9)      IRON DEFICIENCY 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) (ICD10-D50.9)      UMBILICAL HERNIA (ICD-553.1) (DVO45-H46.9)  ANNUAL EXAM - SEPT 2017 (ICD-V70.0) (ICD10-Z00.00)    Allergies:   PENICILLIN (Critical)  * CHLOROQUINE (Moderate)  * INJECTAFER (Moderate)    Meds (prior to this call):   MULTIVITAMINS ORAL CAPSULE (MULTIPLE VITAMIN) Take one capsule by mouth once daily; Route: ORAL  IMITREX 50 MG ORAL TABLET (SUMATRIPTAN SUCCINATE) Take one tablet at onset of migraine. If initial dose was partially effective or headache recurs, may repeat a dose after =2 hours; Route: ORAL            Created By Bartholomew Crews on 03/13/2018 at 02:54 PM    Electronically Signed By  [name] on 03/15/2018 at 09:44 AM

## 2018-03-13 NOTE — Progress Notes (Signed)
* * *      **  [name]**    ------    24 Y old Female, DOB: 07-24-1970    [address], [place], [place] [address]    Home: [phone]    Provider: Toma Aran        * * *    Telephone Encounter    ---    Answered by  Hart Carwin Date: 03/13/2018       Time: 03:07 PM    Caller  Pt    ------            Reason  Medical Letter            Message                     Pt is requesting letter to be sent to her via mail. Thank you.                 Action Taken                     Victorino,Heidi  03/13/2018 3:08:28 PM >      Tyleek Smick  03/13/2018 3:20:10 PM > Enrique Sack, can you call the patient and tell her it was mailed last week?  She should get it in the next few days since the mail room is slow here.  Can you ask her to call on Monday if she still has  not received it?  Thanks.                    * * *                ---          * * *         Patient: [name], [name] DOB: 12-16-1970 Provider: Toma Aran  03/13/2018    ---    Note generated by eClinicalWorks EMR/PM Software ([email or web address])

## 2018-09-25 ENCOUNTER — Ambulatory Visit: Admitting: Obstetrics & Gynecology

## 2018-09-25 NOTE — Progress Notes (Signed)
* * *      [name], [name] **DOB:** May 14, 1970 (48 yo F) **Acc No.** [id number] **DOS:**  09/25/2018    ---       [name], [name]**    ------    19 Y old Female, DOB: 07-Aug-1970    [address], [place], [place] [address]    Home: [phone]    Provider: Toya Smothers, MD        * * *    Telephone Encounter    ---    Answered by  Jessie Foot Date: 09/25/2018       Time: 03:44 PM    Caller  pt    ------            Reason  symptoms appt with Royetta Car np wants telehealth appt            Message                     Hello,            Pt won't elaborate but she is having some symptoms, wants to discuss to see if from previous surgery. Best number is [phone]. Note pt goes by [name].            thanks,                Action Taken                     [facility]  09/25/2018 3:46:07 PM >      Bray,Paula  09/25/2018 3:53:01 PM > pt last seen 06/07/16 postop tah/bs/loa   pt feeling pms symptoms.  low energy, mood swings , insomnia ,  advised appt with jennie mastroianni np      Lin,Jing  10/01/2018 4:10:35 PM > Scheduled pt with [facility] appt 10/20/18@8am . [facility] link sent. TY.                    * * *                ---          * * *         Provider: Toya Smothers, MD 09/25/2018    ---    Note generated by eClinicalWorks EMR/PM Software ([email or web address])

## 2018-10-06 ENCOUNTER — Ambulatory Visit: Admit: 2018-10-06 | Payer: Commercial Managed Care - HMO

## 2018-10-06 ENCOUNTER — Ambulatory Visit: Admitting: Internal Medicine

## 2018-10-06 ENCOUNTER — Ambulatory Visit

## 2018-10-06 DIAGNOSIS — R0683 Snoring: Secondary | ICD-10-CM

## 2018-10-06 DIAGNOSIS — B181 Chronic viral hepatitis B without delta-agent: Secondary | ICD-10-CM

## 2018-10-06 DIAGNOSIS — R5383 Other fatigue: Secondary | ICD-10-CM

## 2018-10-06 DIAGNOSIS — K760 Fatty (change of) liver, not elsewhere classified: Principal | ICD-10-CM

## 2018-10-06 NOTE — Progress Notes (Signed)
 General Medicine Tele-Medicine Visit  This visit is done remotely with myself andthis patient.  Patient presents during the COVID-19 pandemic / federally declared state of   public health emergency.  Visit Type: Audio Visit Only  * Preceptor: Axel Filler Maricela Curet)  Patient consent for audio or video type visit:  Yes  Chief Complaint:   migraines  .  History of Present Illness:   migraines  - went away for a while  - now waking up at night needing to hold her head  - taking sumatriptan prn, trying to avoid as she feels some misc side effec  ts. taking excedrin intermittently, also trying to avoid given overuse head  ache hx  - feels some neck muscle spasms  .  generalized fatigue  - also worsening over past few weeks.   - no anhedonia (mostly limited activities due to profound fatigue), hx of d  epression  - never got sleep study ordered last year as she was rec home sleep test on  ly, and wanted to get in sleep lab   joint pain  - shoulder, finger, knees; everywhere hurts; worsened over the past few mon  ths  - moving around makes it worse  - about the same in AM and PM  - no rashes  .  .  .  .  [name]  Past Medical History:(reviewed)  IDA and uterine fibroids s/p TAH  obesity  fibrocystic breast disease  mid-back pain 2/2 accident  chronic hepatitis B, hepatic steatosis  migraines  .  Surgical: 04/2016 - TAH+BS and umbilical hernia repair  .  Family History: (reviewed)   Mom- died age 25 due to complications of diabetes, also had HTN  Dad- alive, healthy  Grandparents- UNK  Sister with uterine fibroids  Sister with asthma  No children  .  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  Exercise: not currently exercising, used to swim  Sexual history: not currently sexually active, has been in the past with me  n.  No history of STIs.  Last Pap smear April 2017, normal.  No history of   abnormal Pap smears.  Last mammogram 02/2016, normal except for fibrocystic   breast disease.  Vaccines: Unsure when last Tdap was but thinks may have been more than 10 y  ears since her last tetanus vaccine.   .  .  .  Past Medical History (prior to today's visit):  MOUTH PAIN (ICD-528.9) (AQT62-U63.33)  FATIGUE (ICD-780.79) (ICD10-R53.83)  SNORING (ICD-786.09) (ICD10-R06.83)  MIGRAINE (ICD-346.90) (ICD10-G43.909)  BACK PAIN (ICD-724.5) (ICD10-M54.9)  KNEE PAIN, RIGHT (ICD-719.46) (ICD10-M25.561)  OBESITY, BMI 35-39.9, ADULT (ICD-278.00) (ICD10-E66.9)  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) (ICD10-B18.1)  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)      MAMMOGRAM, ABNORMAL, BILATERAL (ICD-793.80) (ICD10-R92.8)  S/P TOTAL ABDOMINAL HYSTERECTOMY (ICD-V88.01) (ICD10-Z90.710)      UTERINE FIBROIDS (ICD-218.9) (ICD10-D25.9)      IRON DEFICIENCY 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) (ICD10-D50.9)      UMBILICAL HERNIA (ICD-553.1) (LKT62-B63.9)  ANNUAL EXAM - SEPT 2017 (ICD-V70.0) (ICD10-Z00.00)  .  Past Medical History (changes today):  Added new problem of 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) (ICD10-G44.  229)  Added new problem of MUSCLE SPASMS OF NECK (ICD-728.85) (ICD10-M62.838)  Added new problem of 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)  Added new problem of NECK PAIN, CHRONIC (ICD-723.1) (ICD10-M54.2)  Added new problem of FATTY LIVER (ICD-571.8) (ICD10-K76.0)  Problems Reviewed:  Done  .  [name]  Medications (prior to today's visit):  MULTIVITAMINS ORAL CAPSULE (MULTIPLE VITAMIN) Take one capsule by mouth onc  e daily; Route: ORAL  IMITREX 50 MG ORAL TABLET (SUMATRIPTAN SUCCINATE) Take one tablet at onset   of  migraine. If initial dose was partially effective or headache recurs, [place]  y repeat a dose after =2 hours; Route: ORAL  .  No Changes to Medication List   Medications Reviewed:  Done  .  [name]  Allergies (prior to today's visit):  PENICILLIN (Critical)  * CHLOROQUINE (Moderate)  * INJECTAFER (Moderate)  .  Allergies Reviewed:  Done  .  .  .  Theodoro Kos from Pt:   Wt: 244 lbs.    .  .  .  .  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  # Generalized Fatigue  DDx fibromyalgia, hypothyroid, OSA, less likely other rheum condition given   diffuse joint pains and unlikely anemia s/p hysterectomy. may also be peri  menopausal  - f/u TSH, CBC  - f/u sleep study  - schedule for in person visit asap for further evaluation  .  # Headaches (mixed migraine wo aura, tension, muscle spasm, med overuse)  - cont sumatriptan and tylenol (avoid excedrin) prn  - will have printout of neck exercises - pt should pick up with lab slips o  r at next visit  - can f/u neuro if worsens  - consider TCA as can help with migraine and FM  .  # Chronic HBV  # Hepatic steatosis  - last checked in 2019 w [place]. Seen by Sherry Ruffing in past, if rise in VL or LFT  s would refer back.  - f/u LFTs, HBV serologies, RUQUS  .  # RHM  - RTC for annual asap   - screening lipids, a1c, with baseline cbc/chem6 with labs as above  - flu shot at in person visit  - no pap needed (s/p hysterectomy)  - dense breasts limiting mammos    .  [name]  I spent 40 minutes with the patient counseling and treating the above condi  tions.  Location of Provider:  Primary Care Office  Location of Patient:  Home  Names of Participants for Visit: [name], Kinji Sathi, Jeane Omoj  owo  .  Orders (this visit):  LYTES [SODIUM] [CPT-82495]  BUN [BUN] [CPT-84520]  Creatinine (CR) [CREATININE] [CPT-82565]  SGOT (AST) [SGOT (AST)]  [CPT-84450]  SGPT (ALT) [SGPT (ALT)] [CPT-84460]  Alkaline Phosphatase (ALK) [ALK PHOS] [CPT-84075]  Bilirubin, Total [BILI TOTAL] [CPT-82247]  CBC  [CPT-85027]  Lipid Profile-Chol, HDL, Trig, LDL(calc) [CHOLESTEROL] [CPT-80061]  Hemoglobin A1C (Glycohemoglobin) [HGBA1C] [CPT-83036]  TSH Reflex [TSH] [CPT-84439]  Hep BE AB (HBEB) [CPT-86707]  Hep BE AG (HBEG) [CPT-87350]  HBV DNA [HEP B PCR] [CPT-87516]  Hep B Surface Antibody (HBSB) [ANTI-HBS] [CPT-86706]  Hep B Surface Antigen (HBSG) [HBSAG] [CPT-87340]  U/S Abdomen Specific Area [  CPT-76705]  RTC (Return to Clinic) [RTC-000]  Telephone Call Complex (21+ min) [NET-[id number]]  .  **Preceptor Note**  Resident: [name])  Problem Follow-up  .  [name]  Subjective   Patient is a 48 Years Old Female who presents to clinic for follow-up.  [name]  Notes worsening migraines.  Notes mixed tension and migrinous features.  Ta  kes sumatriptan PRN.    [name]  Notes increased fatigue, no desire to do stuff given fatigue.  [name]  Notes diffuse arthralgias.     I discussed the patient with Dr. Modesto Charon [facility]) in a routine precepting enc  ounter.  I agree with the patient's diagnosis and management.    .  .  Objective   see resident's note for additional details, see resident's note for details  .    [name]  Assessment   fatigue - ?depression vs. fibro vs. OSA  chronic Hep B  .  Plan   - will need lab work done, order sleep study  - check Hep B serologies  - RTC for in person visit  See resident note for details, Follow-up per resident note, Return visit sc  heduled  .  Seen On Team (Floor):  4B  .  .  .  .  .  Patient Care Plan  .  [name]  Immunization Worksheet 2019 (rev 02/27/2018)   .  .  .  .  .  .  .  .  .  .  .  .  .  Follow-up With:   .  .  [name]  Electronically Signed by Clearence Cheek, MD on 10/06/2018 at 8:56 PM  ________________________________________________________________________

## 2018-10-06 NOTE — Progress Notes (Signed)
[facility] October 06, 2018  [address]   [place]  [place] [address]  Main: [phone]  Fax: [phone]  Patient Portal: [email or web address]            [facility]   Phone: [phone] . Fax: [phone]    Patient Name: [name] [name]  DOB: 10-Jan-1971 MR#: [id number]  Primary Care Physician: [name] MD -[facility] 4B-  Phone #:   Primary Language: English  Insurance: H90 [employer] [facility] PCP   ID#: [id number]  Pt Address: [address]  [place], [place]  [address]  Home Phone: [phone]   Work Phone: [phone]  **********************************************************************************************************************************************  NOTE: Recent supporting office notes will be required with this request.  **********************************************************************************************************************************************  Ht: 64 in     Wt: 232.6 lbs     BMI: 40.07     Epworth Scale Total (required, see below): ______  Epsworth Sleepiness Scale (score each of following 8 questions from 0 to 3, record total above):    0 = Would never doze/sleep; 1 = Slight chance of doze/sleep; 2 = Mod chance of doze/sleep; 3 = High chance of doze/sleep  ___ Sitting and reading        ___ Watching TV ___ Sitting inactive in public place ___ Lying down in afternoon       ___ Sitting quiet after lunch   ___ Sitting and talking to someone        ___ Stopped for few minutes in traffic while driving ___ As passenger in motor vehicle for > 1 hr    Special Needs? _______________________________________________________  Primary Suspected Diagnosis: ____________________________________________  Has pt had previous sleep testing? ___ Yes _x__ No (If yes, please submit Sleep Reports if available)  Schedule follow-up with Sleep Specialist? _x prn__ Yes ___ No    **********************************************************************************************************************************************  Clinical Info (Select at least 3 for Adult only, to qualify for Home Study Testing):   _x__ Excessive daytime sleepiness _x__ Disruptive snoring  _x__ Disturbed or restless sleep _x__ Non-restorative sleep ___ Hypertension  ___ Witnessed Apnea  _x__ Irritability   ___ Hallucinations  _x__ Decrease concentration ___ Memory loss   ___ Wake up gasping/choking  _x__ Morning headaches  ___ Night Terrors  ___ Sleep walking/talking  _x__ Bruxism   ___ Leg/Arm jerks  ___ Decrease libido  ___ Injury during sleep  ___ Nocturia   ___ Inability to fall/remain asleep  ___ Irreg. breathing/pauses during sleep ___ Enlarged Tonsils/abnormalities compromising respiration  ___ Other: ________________________    Medical Hx (Check all that apply): ___ Stroke (date__________) ___ Transient Ischemic Attack  ___ Coronary Artery Disease ___ Pulmonary Hypertension ___ Mod/Sev COPD, Lung disease  ___ Mod/Sev CHF  ___ Suspected Nocturnal Seizures ___ Suspected Narcolepsy  ___ Polycythemia  ___ Documented Obesity Hypoventilation Syndrome  ___ Arrhythmias: ___________ ___ H/o Central OR Mixed Sleep Apnea (must be in prior sleep report)  ___ Neurodegenerative Disorders / Cognitive Impairment preventing HST  ___ Sleep behaviors not recalled by pt, but suspicious of REM behavior disorder  ___ Neuromuscular Weakness affecting respiratory function or impairing activities (specify): ___________     Other (Please check all that apply): ___ Oxygen dependent for any reason ___ Recent T/A, UPP ___ Change in BMI > 5  ___ Neuromuscular Impairment; needs assistance with ADLs  _x__ New Symptoms/Other: ____migraines__________    Physician signature: ________.....................................Marland KitchenMarland KitchenJanalyn Shy MD -[facility] 4[facility]-  October 06, 2018 2:24 PM  ______________________________________ Date:  10/06/2018    Coordinator Checklist: Initial to notate that each  area is complete  ___ Epsworth Sleepiness  ___ Clinical Info (must select 3 for home study)  ___ Medical History (Office Notes)    Problems:  MOUTH PAIN (ICD-528.9) (ZOX09-U04.54)  FATIGUE (ICD-780.79) (ICD10-R53.83)  SNORING (ICD-786.09) (ICD10-R06.83)  MIGRAINE (ICD-346.90) (ICD10-G43.909)  BACK PAIN (ICD-724.5) (ICD10-M54.9)  KNEE PAIN, RIGHT (ICD-719.46) (ICD10-M25.561)  OBESITY, BMI 35-39.9, ADULT (ICD-278.00) (ICD10-E66.9)  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) (ICD10-B18.1)  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)      MAMMOGRAM, ABNORMAL, BILATERAL (ICD-793.80) (ICD10-R92.8)  S/P TOTAL ABDOMINAL HYSTERECTOMY (ICD-V88.01) (ICD10-Z90.710)      UTERINE FIBROIDS (ICD-218.9) (ICD10-D25.9)      IRON DEFICIENCY 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) (ICD10-D50.9)      UMBILICAL HERNIA (ICD-553.1) (UJW11-B14.9)  ANNUAL EXAM - SEPT 2017 (ICD-V70.0) (ICD10-Z00.00)      Medications:   MULTIVITAMINS ORAL CAPSULE Take one capsule by mouth once daily; Route: ORAL, IMITREX 50 MG ORAL TABLET Take one tablet at onset of migraine. If initial dose was partially effective or headache recurs, may repeat a dose after =2 hours; Route: ORAL.      Allergies:   PENICILLIN (Critical)  * CHLOROQUINE (Moderate)  * INJECTAFER (Moderate)          Created By [name] MD -[facility] 4B- on 10/06/2018 at 02:24 PM    Electronically Signed By [name] MD -[facility] 4B- on 10/06/2018 at 02:24 PM

## 2018-10-09 ENCOUNTER — Ambulatory Visit: Admit: 2018-10-09 | Payer: Commercial Managed Care - HMO

## 2018-10-09 ENCOUNTER — Ambulatory Visit: Admitting: Internal Medicine

## 2018-10-09 DIAGNOSIS — Z0000 Encounter for general adult medical examination without abnormal findings: Principal | ICD-10-CM

## 2018-10-15 ENCOUNTER — Ambulatory Visit: Admit: 2018-10-15 | Payer: Commercial Managed Care - HMO

## 2018-10-15 ENCOUNTER — Ambulatory Visit

## 2018-10-15 ENCOUNTER — Ambulatory Visit: Admitting: Internal Medicine

## 2018-10-15 DIAGNOSIS — Z0000 Encounter for general adult medical examination without abnormal findings: Secondary | ICD-10-CM

## 2018-10-15 DIAGNOSIS — N943 Premenstrual tension syndrome: Secondary | ICD-10-CM

## 2018-10-15 DIAGNOSIS — B181 Chronic viral hepatitis B without delta-agent: Secondary | ICD-10-CM

## 2018-10-15 DIAGNOSIS — F329 Major depressive disorder, single episode, unspecified: Principal | ICD-10-CM

## 2018-10-15 DIAGNOSIS — R5383 Other fatigue: Secondary | ICD-10-CM

## 2018-10-15 DIAGNOSIS — K760 Fatty (change of) liver, not elsewhere classified: Secondary | ICD-10-CM

## 2018-10-15 DIAGNOSIS — H539 Unspecified visual disturbance: Secondary | ICD-10-CM

## 2018-10-15 DIAGNOSIS — Z23 Encounter for immunization: Secondary | ICD-10-CM

## 2018-10-15 DIAGNOSIS — G43909 Migraine, unspecified, not intractable, without status migrainosus: Secondary | ICD-10-CM

## 2018-10-15 DIAGNOSIS — N62 Hypertrophy of breast: Secondary | ICD-10-CM

## 2018-10-15 LAB — HX HEM-ROUTINE
HX HCT: 42.6 % (ref 32.0–45.0)
HX HGB: 13.9 g/dL (ref 11.0–15.0)
HX MCH: 30.4 pg (ref 26.0–34.0)
HX MCHC: 32.6 g/dL (ref 32.0–36.0)
HX MCV: 93.2 fL (ref 80.0–98.0)
HX MPV: 10.3 fL (ref 9.1–11.7)
HX NRBC #: 0 10*3/uL
HX NUCLEATED RBC: 0 %
HX PLT: 306 10*3/uL (ref 150–400)
HX RBC BLOOD COUNT: 4.57 M/uL (ref 3.70–5.00)
HX RDW: 13.4 % (ref 11.5–14.5)
HX WBC: 5.4 10*3/uL (ref 4.0–11.0)

## 2018-10-15 LAB — HX CHEM-METABOLIC
HX HEMOGLOBIN A1C: 5 %
HX THYROID STIMULATING HORMONE (TSH): 2.78 u[IU]/mL (ref 0.35–4.94)

## 2018-10-15 LAB — HX CHEM-LFT
HX ALANINE AMINOTRANSFERASE (ALT/SGPT): 48 IU/L (ref 0–54)
HX ALKALINE PHOSPHATASE (ALK): 78 IU/L (ref 40–130)
HX ASPARTATE AMINOTRANFERASE (AST/SGOT): 36 IU/L (ref 10–42)
HX BILIRUBIN, TOTAL: 0.6 mg/dL (ref 0.2–1.1)

## 2018-10-15 LAB — HX CHEM-PANELS
HX ANION GAP: 6 (ref 3–14)
HX BLOOD UREA NITROGEN: 7 mg/dL (ref 6–24)
HX CHLORIDE (CL): 106 meq/L (ref 98–110)
HX CO2: 26 meq/L (ref 20–30)
HX CREATININE (CR): 0.64 mg/dL (ref 0.57–1.30)
HX GFR, AFRICAN AMERICAN: 123 mL/min/{1.73_m2}
HX GFR, NON-AFRICAN AMERICAN: 106 mL/min/{1.73_m2}
HX POTASSIUM (K): 3.7 meq/L (ref 3.6–5.1)
HX SODIUM (NA): 138 meq/L (ref 135–145)

## 2018-10-15 LAB — HX CHEM-LIPIDS
HX CHOL-HDL RATIO: 4.6
HX CHOLESTEROL: 267 mg/dL — ABNORMAL HIGH (ref 110–199)
HX HIGH DENSITY LIPOPROTEIN CHOL (HDL): 58 mg/dL (ref 35–75)
HX HOURS FAST: 0 h
HX LDL: 190 mg/dL — ABNORMAL HIGH (ref 0–129)
HX TRIGLYCERIDES: 94 mg/dL (ref 40–250)

## 2018-10-15 LAB — HX CHOLESTEROL
HX CHOLESTEROL: 267 mg/dL — ABNORMAL HIGH (ref 110–199)
HX HIGH DENSITY LIPOPROTEIN CHOL (HDL): 58 mg/dL (ref 35–75)
HX LDL: 190 mg/dL — ABNORMAL HIGH (ref 0–129)
HX TRIGLYCERIDES: 94 mg/dL (ref 40–250)

## 2018-10-15 LAB — HX DIABETES: HX HEMOGLOBIN A1C: 5 %

## 2018-10-15 MED ORDER — ESCITALOPRAM OXALATE: 1 | 30 | 1 refills | 0 days | Status: DC
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
## 2018-10-16 ENCOUNTER — Ambulatory Visit

## 2018-10-16 NOTE — Progress Notes (Signed)
 [facility]  [address]  [place] [address]  Main: [phone]  Fax: [phone]  Patient Portal: [email or web address]        October 16, 2018            MRN: [id number]            DOB: [birth year]   [name] [name]   [address]   [place] [address]      I am writing to let you know that the following appointments have been made for you.       Mammogram  [place] [facility]  Your appointment is scheduled for 01/14/2019 at 4.00pm  If you need to make any changes, please call [phone]            Sincerely,         [facility]  [phone]        Created By Lonia Skinner on 10/16/2018 at 12:02 PM    Electronically Signed By Lonia Skinner on 10/16/2018 at 12:02 PM

## 2018-10-17 ENCOUNTER — Ambulatory Visit: Admit: 2018-10-17 | Payer: Commercial Managed Care - HMO

## 2018-10-17 ENCOUNTER — Ambulatory Visit: Admitting: Internal Medicine

## 2018-10-17 ENCOUNTER — Ambulatory Visit

## 2018-10-17 DIAGNOSIS — B181 Chronic viral hepatitis B without delta-agent: Principal | ICD-10-CM

## 2018-10-17 LAB — HX IMMUNOLOGY
HX ANTI-HEPATITIS BE ANTIBODY: REACTIVE — AB
HX HEP B VIRAL DNA: 644 {copies}/mL — AB
HX HEPATITIS B DNA VIRAL LOAD, LOG: 2.28 {Log_IU}/mL — AB
HX HEPATITIS B DNA VIRAL LOAD: 189 [IU]/mL — AB
HX HEPATITIS B SURFACE AG CONFIRM: POSITIVE — AB
HX HEPATITIS B SURFACE ANTIBODY: 14.8 m[IU]/mL — ABNORMAL HIGH (ref 0.0–7.9)
HX HEPATITIS BE ANTIGEN: NONREACTIVE

## 2018-10-18 ENCOUNTER — Ambulatory Visit

## 2018-10-18 NOTE — Progress Notes (Signed)
 [facility]  [address]  [place] [address]  Main: [phone]  Fax: [phone]  Patient Portal: [email or web address]      October 18, 2018            MRN: [id number]            DOB: 30-Sep-1970   [name] [name]   [address]   [place] [address]      The results of your recent tests are as follows:      The ultrasound of your liver was unchanged from your last one in May 2019. You will need repeat ultrasound in 6 months - this is for liver cancer screening.     Other Tests:    Abdomen Ultrasound = Hepatic steatosis (fatty liver) without evidence for a hepatic mass lesion.  Area in the gallbladder suggestive of a benign growth of the gallbadder wall and mucosa (stable from prior)       Sincerely,       [name] MD -[facility] 4B-    [facility]    Results Provided by: Mail           Created By [name] on 10/18/2018 at 09:00 AM    Electronically Signed By [name] MD -[facility] 4B- on 10/18/2018 at 09:25 AM

## 2018-10-18 NOTE — Telephone Encounter (Signed)
 RESPONSE/ORDERS:  Called pt to discuss results - reviewed largely normal labwork, clearing HBV infxn and normal [place]. Noted hyperlipidemia - pt now on new diet, swimming and would like to give lifestyle changes a chance. Will recheck lipids in 2-3 months. Pt also reporting dizziness/ringing in ears - unclear if migraine or SE from escitalopram and pt self-dc'd medication yesterday. Will schedule televisit f/u to discuss further. Pt can continue to hold - expect 2-3 days before med to wash out.  .....................................Marland KitchenMarland KitchenJanalyn Shy MD -[facility] 4B-  October 21, 2018 1:58 PM    Called pt and set up appt on 10/15 with PA, Divya. Thank you!  ......................................Marland KitchenMertha Finders  October 21, 2018 2:17 PM           ORDERS/PROBS/MEDS/ALL     Problems:   DEPRESSION (ICD-311) (ICD10-F32.9)  PMDD - QUESTION OF (ICD-625.4) (ICD10-N94.3)  CHANGES IN VISION (ICD-368.9) (ICD10-H53.9)  MACROMASTIA (ICD-611.1) (ICD10-N62)  VACCINE AGAINST INFLUENZA (ICD-V04.8) (PRA74-A55)  FATTY LIVER (ICD-571.8) (ICD10-K76.0)  MOUTH PAIN (ICD-528.9) (KZG94-Q34.75)  FATIGUE (ICD-780.79) (ICD10-R53.83)  SNORING (ICD-786.09) (ICD10-R06.83)  MIGRAINE (ICD-346.90) (ICD10-G43.909)      MUSCLE SPASMS OF NECK (ICD-728.85) (ICD10-M62.838)      NECK PAIN, CHRONIC (ICD-723.1) (ICD10-M54.2)      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)      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)  BACK PAIN (ICD-724.5) (ICD10-M54.9)  KNEE PAIN, RIGHT (ICD-719.46) (ICD10-M25.561)  OBESITY, BMI 35-39.9, ADULT (ICD-278.00) (ICD10-E66.9)  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) (ICD10-B18.1)  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)      MAMMOGRAM, ABNORMAL, BILATERAL (ICD-793.80) (ICD10-R92.8)  S/P TOTAL ABDOMINAL HYSTERECTOMY (ICD-V88.01) (ICD10-Z90.710)      UTERINE FIBROIDS (ICD-218.9) (ICD10-D25.9)      IRON DEFICIENCY 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) (ICD10-D50.9)      UMBILICAL HERNIA (ICD-553.1)  (SVE74-G00.9)  ANNUAL EXAM - SEPT 2017 (ICD-V70.0) (ICD10-Z00.00)    Allergies:   PENICILLIN (Critical)  * CHLOROQUINE (Moderate)  * INJECTAFER (Moderate)    Meds (prior to this call):   MULTIVITAMINS ORAL CAPSULE (MULTIPLE VITAMIN) Take one capsule by mouth once daily; Route: ORAL  IMITREX 50 MG ORAL TABLET (SUMATRIPTAN SUCCINATE) Take one tablet at onset of migraine. If initial dose was partially effective or headache recurs, may repeat a dose after =2 hours; Route: ORAL  ESCITALOPRAM OXALATE 5 MG ORAL TABLET (ESCITALOPRAM OXALATE) Take one tablet daily; Route: ORAL            Created By [name] MD -[facility] 4B- on 10/18/2018 at 09:27 AM    Electronically Signed By [name] MD -[facility] 4B- on 10/23/2018 at 10:43 AM

## 2018-10-20 ENCOUNTER — Ambulatory Visit: Admit: 2018-10-20 | Payer: Commercial Managed Care - HMO

## 2018-10-20 ENCOUNTER — Ambulatory Visit: Admitting: Obstetrics & Gynecology

## 2018-10-20 ENCOUNTER — Ambulatory Visit

## 2018-10-20 DIAGNOSIS — N943 Premenstrual tension syndrome: Principal | ICD-10-CM

## 2018-10-20 MED ORDER — Sertraline HCl: 25 | tablets | Freq: Every day | 2 refills | 0 days
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
## 2018-10-21 ENCOUNTER — Ambulatory Visit

## 2018-10-21 NOTE — Progress Notes (Signed)
 [facility]  [address]  [place] [address]  Main: [phone]  Fax: [phone]  Patient Portal: [email or web address]      October 21, 2018            MRN: [id number]            DOB: 01-Dec-1970   [name] [name]   [address]   [place] [address]      The results of your recent tests are as follows:      As discussed on the phone, your tests are all normal except for high cholesterol. We will re-check this in 2-3 months to see if diet and exercise will lower it sufficiently. Your hepatitis B virus levels are decreasing and you now have antibodies to the virus, suggesting that you are clearing the infection.     Cholesterol Tests:      Total Cholesterol   267  Normal 110-199    10/15/2018    HDL (good cholesterol)  58  Normal >40     10/15/2018    Triglyceride    94  Normal 40-250    10/15/2018    LDL (bad cholesterol)   190  Normal <160    10/15/2018     Diabetes Tests:      HgbA1c:    5.0  Normal 4.3 - 5.6    10/15/2018     Blood Count Tests:      Hematocrit:    42.6  Normal 32-45 women, 37-47 men  10/15/2018    Hemoglobin:    13.9  Normal 13.5-16 female, 51-15 female 10/15/2018    White Blood Cells:   5.4  Normal 4 - 11    10/15/2018    Platelets:    306  Normal 150-400    10/15/2018    MCV:     93.2  Normal 80-96    10/15/2018     Liver Tests:      ALT:     48  Normal 0 - 54    10/15/2018    AST:     36  Normal 10 - 42    10/15/2018    Total Bili:    0.6  Normal 0.2 - 1.1    10/15/2018    Alk Phos:    78  Normal 40 - 130    10/15/2018     Hepatitis Tests:      Hep B surface antibody: 14.8   Normal > 8.0   10/15/2018    Hep B viral DNA:   644   Normal=Not detected  10/15/2018    Hep B viral load:   189   Normal=Not detected  10/15/2018     Endocrine Tests:      TSH:     2.78  Normal 0.35-4.94   10/15/2018     Kidney Tests:      BUN:     7  Normal 6 - 24    10/15/2018    Creatinine:    0.64  Normal 0.57 - 1.30   10/15/2018    GFR (Non  African-American):  106  Normal >= 60    10/15/2018    GFR (African-American):  123  Normal >= 60    10/15/2018     Electrolyte Tests:      Sodium:    138 MEQ/L  Normal 135-145   10/15/2018    Potassium:    3.7 MEQ/L  Normal 3.6-5.1  10/15/2018    Chloride:    106 MEQ/L  Normal 98-110   10/15/2018    Bicarbonate:    26 MEQ/L  Normal 20 - 30   10/15/2018       Sincerely,       [name] MD -KH 4B-  [facility]    Results Provided by: Mail  Phone         Created By [name] MD -KH 4B- on 10/21/2018 at 02:02 PM    Electronically Signed By [name] MD -KH 4B- on 10/21/2018 at 02:11 PM

## 2018-10-23 ENCOUNTER — Ambulatory Visit

## 2018-10-30 ENCOUNTER — Ambulatory Visit: Admit: 2018-10-30 | Payer: Commercial Managed Care - HMO

## 2018-10-30 ENCOUNTER — Ambulatory Visit

## 2018-10-30 ENCOUNTER — Ambulatory Visit: Admitting: Internal Medicine

## 2018-10-30 DIAGNOSIS — F329 Major depressive disorder, single episode, unspecified: Principal | ICD-10-CM

## 2018-10-30 MED ORDER — ESCITALOPRAM OXALATE: 1 | 30 | 1 refills | 0 days | Status: DC
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
## 2018-11-07 ENCOUNTER — Ambulatory Visit

## 2018-11-07 NOTE — Telephone Encounter (Signed)
 Call Details:   Patient PCP = [name] MD -KH 4BEfraim Kaufmann  from [facility] called on November 07, 2018 10:29 AM.  Message taken by: Bartholomew Crews  Primary call-back number: [phone]    Secondary call-back number: () -    Call Reason(s): Message/Call-Back      ** MESSAGE / CALL-BACK.  Regarding: Would like to know if the pt was pregnant when she had her Hepb Labs drawn on 9/30. Please contact Melissa regarding this matter    ---------- ---------- ---------- ---------- ---------- ----------       RESPONSE/ORDERS:    returned call to  [facility] -   able to give info that pt did not have HCG done 9-30 20  and that there is documentation that pt is s/p hysterectomy   please call back with any questions  ......................................Marland KitchenTod Persia, RN  November 07, 2018 11:09 AM               ORDERS/PROBS/MEDS/ALL     Problems:   DEPRESSION (ICD-311) (ICD10-F32.9)  PMDD - QUESTION OF (ICD-625.4) (ICD10-N94.3)  CHANGES IN VISION (ICD-368.9) (ICD10-H53.9)  MACROMASTIA (ICD-611.1) (ICD10-N62)  VACCINE AGAINST INFLUENZA (ICD-V04.8) (AYT01-S01)  FATTY LIVER (ICD-571.8) (ICD10-K76.0)  MOUTH PAIN (ICD-528.9) (UXN23-F57.32)  FATIGUE (ICD-780.79) (ICD10-R53.83)  SNORING (ICD-786.09) (ICD10-R06.83)  MIGRAINE (ICD-346.90) (ICD10-G43.909)      MUSCLE SPASMS OF NECK (ICD-728.85) (ICD10-M62.838)      NECK PAIN, CHRONIC (ICD-723.1) (ICD10-M54.2)      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)      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)  BACK PAIN (ICD-724.5) (ICD10-M54.9)  KNEE PAIN, RIGHT (ICD-719.46) (ICD10-M25.561)  OBESITY, BMI 35-39.9, ADULT (ICD-278.00) (ICD10-E66.9)  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) (ICD10-B18.1)  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)      MAMMOGRAM, ABNORMAL, BILATERAL (ICD-793.80) (ICD10-R92.8)  S/P TOTAL ABDOMINAL HYSTERECTOMY (ICD-V88.01) (ICD10-Z90.710)      UTERINE FIBROIDS (ICD-218.9) (ICD10-D25.9)      IRON DEFICIENCY  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) (ICD10-D50.9)      UMBILICAL HERNIA (ICD-553.1) (KGU54-Y70.9)  ANNUAL EXAM - SEPT 2017 (ICD-V70.0) (ICD10-Z00.00)    Allergies:   PENICILLIN (Critical)  * CHLOROQUINE (Moderate)  * INJECTAFER (Moderate)    Meds (prior to this call):   MULTIVITAMINS ORAL CAPSULE (MULTIPLE VITAMIN) Take one capsule by mouth once daily; Route: ORAL  IMITREX 50 MG ORAL TABLET (SUMATRIPTAN SUCCINATE) Take one tablet at onset of migraine. If initial dose was partially effective or headache recurs, may repeat a dose after =2 hours; Route: ORAL            Created By Bartholomew Crews on 11/07/2018 at 10:29 AM    Electronically Signed By Tod Persia RN on 11/07/2018 at 11:09 AM

## 2018-11-11 ENCOUNTER — Ambulatory Visit

## 2019-01-22 ENCOUNTER — Ambulatory Visit: Admit: 2019-01-22 | Payer: Commercial Managed Care - HMO

## 2019-01-22 ENCOUNTER — Ambulatory Visit: Admitting: Hospitalist

## 2019-01-22 ENCOUNTER — Ambulatory Visit

## 2019-01-22 DIAGNOSIS — R5383 Other fatigue: Principal | ICD-10-CM

## 2019-01-22 DIAGNOSIS — R0683 Snoring: Secondary | ICD-10-CM
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
## 2019-01-22 NOTE — Progress Notes (Signed)
* * *      [name] [name], [name] [name] **DOB:** 16-Mar-1970 (49 yo F) **Acc No.** [id number] **DOS:**  01/22/2019    ---       [name], [name] [name]**    ------    9 Y old Female, DOB: [birth year]    [address], [place], [place] [address]    Home: [phone]    Provider: Carolann Littler        * * *    Telephone Encounter    ---    Answered by  Carolann Littler Date: 01/22/2019       Time: 09:43 AM    Reason  schedule 2 months f/u to review HST results    ------            Action Taken                     [facility]  01/22/2019 9:43:50 AM > schedule 2 months f/u to review HST results      [name] [name],[name] [name]  04/07/2019 10:59:10 AM > booked.                    * * *                ---          * * *         Provider: Carolann Littler 01/22/2019    ---    Note generated by eClinicalWorks EMR/PM Software ([email or web address])
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
## 2019-02-06 ENCOUNTER — Ambulatory Visit: Admit: 2019-02-06 | Payer: Commercial Managed Care - HMO

## 2019-02-06 ENCOUNTER — Ambulatory Visit: Admitting: Plastic and Reconstructive Surgery

## 2019-02-06 ENCOUNTER — Ambulatory Visit
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
## 2019-02-08 ENCOUNTER — Ambulatory Visit: Admitting: Hospitalist

## 2019-02-10 ENCOUNTER — Ambulatory Visit

## 2019-03-05 ENCOUNTER — Ambulatory Visit: Admit: 2019-03-05 | Payer: Commercial Managed Care - HMO

## 2019-03-05 ENCOUNTER — Ambulatory Visit: Admitting: Internal Medicine

## 2019-03-05 ENCOUNTER — Ambulatory Visit

## 2019-03-05 DIAGNOSIS — Z1231 Encounter for screening mammogram for malignant neoplasm of breast: Principal | ICD-10-CM

## 2019-03-06 ENCOUNTER — Ambulatory Visit

## 2019-03-06 NOTE — Progress Notes (Signed)
 [facility]  [address]  [place] [address]  Main: [phone]  Fax: [phone]  Patient Portal: [email or web address]      March 06, 2019            MRN: [id number]            DOB: 01/30/1970   [name] [name]   [address]   [place] [address]      The results of your recent tests are as follows:      Diagnostic mammogram:  IMPRESSION:   No mammographic evidence for malignancy.   Waxing and waning cysts in both breasts, the largest in the right side, correspond with the palpable lumps, these are benign.   A 4 cm probably benign complicated cyst in the posterior depth 9:00 right breast, recommend a six-month follow-up ultrasound.        As you are already aware, your mammogram showed benign cysts in both breasts. You have a follow up appointment on 09/03/2019 for continued surveillance  Please call our office if you have any questions      Sincerely,       Ariston Grandison [facility] PA  [facility]  [phone]  Results Provided by: Mail           Created By Clifton James PA on 03/06/2019 at 04:49 PM    Electronically Signed By Clifton James PA on 03/06/2019 at 04:52 PM

## 2019-04-01 ENCOUNTER — Ambulatory Visit

## 2019-04-01 ENCOUNTER — Ambulatory Visit: Admitting: Infectious Disease

## 2019-04-01 NOTE — Progress Notes (Signed)
* * *      Mondo, Reveca **DOB:** 19-Dec-1970 (49 yo F) **Acc No.** [id number] **DOS:**  04/01/2019    ---       [name], Numa**    ------    85 Y old Female, DOB: 03-Mar-1970    [address], [place], [place] [address]    Home: [phone]    Provider: Rhunette Croft        * * *    Telephone Encounter    ---    Answered by  Rhunette Croft Date: 04/01/2019       Time: 01:54 PM    Reason  F/u needed    ------            Message                     Dear clinic,      This patient called wanting a f/u apt with me for HBV review. Can we book her with me/Ayesha at next avaialble. The phone no is her work phone but she is avaialble the next few hours to chat.      Kandis Henry                Action Taken                     [facility]  04/01/2019 1:55:57 PM >      Genovese,Gina  04/01/2019 2:51:08 PM > I just spoke with the patient and she stated that Monday mornings do not work for her. Since it looks llike she would be a new patient shall I schedule with any other provider?      Cynai Skeens  04/01/2019 3:05:48 PM > Seen in 2018 also by Dr. Sherry Ruffing which would be good or Corrie Dandy perhaps? Thanks, Dutch Quint  04/01/2019 3:47:54 PM > Sorry one more question. Since it's been 3 years should it be a New patient visit or follow up      [facility]  04/01/2019 4:46:22 PM > I would do f/u if with Dr. Sherry Ruffing but otherwise a new patien            Genovese,Gina  04/02/2019 9:56:34 AM > I spoke with the patient and she is scheduled for 04/14/19 at 10:00am with Dr. Sherry Ruffing. Sending to you as Tonia Ghent  04/02/2019 12:53:44 PM > Randie Heinz, thanks                    * * *                ---          * * *         Provider: Rhunette Croft 04/01/2019    ---    Note generated by eClinicalWorks EMR/PM Software ([email or web address])

## 2019-04-01 NOTE — Telephone Encounter (Signed)
 Call Details:   Patient PCP = [name] [name] 4B-  [name] [name] (Patient) called on April 01, 2019 1:02 PM.  Message taken by: Virgina Evener  Primary call-back number: [phone]    Secondary call-back number: [phone] -    Call Reason(s): Message/Call-Back      ** MESSAGE / CALL-BACK.  Regarding: Pt would like a call back regarding questions for her ultrasound results    ---------- ---------- ---------- ---------- ---------- ----------       RESPONSE/ORDERS:  pt called again and stated that she also misplaced her therapist list that the PA provided her and is hoping a copy can be emailed to her at [email or web address] or if she can pick another copy.   .......................................[name]  April 01, 2019 1:13 PM    spoke w pt  got u/s results  c/o " is very concerned due to need to repeat due to muddy results/ in 6 months  the letter did nto indicate this to pt  unsure if Dr Modesto Charon is aware she has painful cysts and questions the need to excsie them-wants to d/w dr Modesto Charon directly"    also traveled out of [place] and now returned ,woudl like to f/u now w therapist if possible ?  willfwd to PCP  ......................................Marland KitchenKennon Rounds RN  April 01, 2019 3:10 PM    Dr Modesto Charon will be in tomorrow and can discuss with pt then. [name], can you email patient the list from 10/23/2018 entitled "Therapist Recommendations"?.......................................Marland KitchenMassie Maroon, [name]  April 01, 2019 4:14 PM    Emailec to pt. Thank you!  ......................................Marland KitchenMertha Finders  April 01, 2019 5:42 PM    Called patient to discuss breast US results. She has known cysts, but developed pain in her breasts recently. She is trying to get breast reduction surgery after she loses consistent weight (still yo-yoing with 15lb) and not sure if this is something they should know. Recommended OTC pain meds, if pain persists, can call back. Already scheduled for rpt [place] in 6 mo for the deeper breast cyst  that may be abnormal. Recommended she discuss these results w her breast surgeon as this could alter surgical plans esp if abnormal. Also reviewed a1c and lipid results per pt request, she was borderline for immediate initiation of statin, wants to hold off as she is actively losing weight, will need rpt lipids at next visit  .....................................Marland KitchenMarland KitchenJanalyn Shy [name] -[facility] 4B-  April 02, 2019 5:14 PM             ORDERS/PROBS/MEDS/ALL     Problems:   DEPRESSION (ICD-311) (ICD10-F32.9)  PMDD - QUESTION OF (ICD-625.4) (ICD10-N94.3)  CHANGES IN VISION (ICD-368.9) (ICD10-H53.9)  MACROMASTIA (ICD-611.1) (ICD10-N62)  VACCINE AGAINST INFLUENZA (ICD-V04.8) (ALP37-T02)  FATTY LIVER (ICD-571.8) (ICD10-K76.0)  MOUTH PAIN (ICD-528.9) (IOX73-Z32.99)  FATIGUE (ICD-780.79) (ICD10-R53.83)  SNORING (ICD-786.09) (ICD10-R06.83)  MIGRAINE (ICD-346.90) (ICD10-G43.909)      MUSCLE SPASMS OF NECK (ICD-728.85) (ICD10-M62.838)      NECK PAIN, CHRONIC (ICD-723.1) (ICD10-M54.2)      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)      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)  BACK PAIN (ICD-724.5) (ICD10-M54.9)  KNEE PAIN, RIGHT (ICD-719.46) (ICD10-M25.561)  OBESITY, BMI 35-39.9, ADULT (ICD-278.00) (ICD10-E66.9)  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) (ICD10-B18.1)  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)      MAMMOGRAM, ABNORMAL, BILATERAL (ICD-793.80) (ICD10-R92.8)  S/P TOTAL ABDOMINAL HYSTERECTOMY (ICD-V88.01) (ICD10-Z90.710)      UTERINE FIBROIDS (ICD-218.9) (ICD10-D25.9)      IRON DEFICIENCY 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) (ICD10-D50.9)  UMBILICAL HERNIA (ICD-553.1) (GJF59-B39.9)  ANNUAL EXAM - SEPT 2017 (ICD-V70.0) (ICD10-Z00.00)    Allergies:   PENICILLIN (Critical)  * CHLOROQUINE (Moderate)  * INJECTAFER (Moderate)    Meds (prior to this call):   MULTIVITAMINS ORAL CAPSULE (MULTIPLE VITAMIN) Take one capsule by mouth once daily; Route: ORAL  IMITREX 50 MG ORAL TABLET (SUMATRIPTAN SUCCINATE)  Take one tablet at onset of migraine. If initial dose was partially effective or headache recurs, may repeat a dose after =2 hours; Route: ORAL            Created By Virgina Evener on 04/01/2019 at 01:02 PM    Electronically Signed By [name] [name] 4B- on 04/02/2019 at 05:15 PM

## 2019-04-03 ENCOUNTER — Ambulatory Visit: Admitting: Pulmonary Disease

## 2019-04-03 NOTE — Progress Notes (Signed)
* * *      Keep, [name] [name] **DOB:** October 13, 1970 (49 yo F) **Acc No.** [id number] **DOS:**  04/03/2019    ---       [name], [name] [name]**    ------    58 Y old Female, DOB: May 11, 1970    [address], [place], [place] [address]    Home: [phone]    Provider: [name]        * * *    Telephone Encounter    ---    Answered by  Brantley Stage Date: 04/03/2019       Time: 04:47 PM    Reason  [place] HST    ------            Message                     The patient had HST done on 03/24/2019 with overall AHI of 23.8/hr with oxygen nadir of 77%. The patient will be provided an outpatient clinic sleep clinic appointment to discuss results                     * * *                ---          * * *         Provider: [name] 04/03/2019    ---    Note generated by eClinicalWorks EMR/PM Software ([email or web address])

## 2019-04-09 ENCOUNTER — Ambulatory Visit: Admit: 2019-04-09 | Payer: Commercial Managed Care - HMO

## 2019-04-09 ENCOUNTER — Ambulatory Visit

## 2019-04-09 ENCOUNTER — Ambulatory Visit: Admitting: Internal Medicine

## 2019-04-09 DIAGNOSIS — F329 Major depressive disorder, single episode, unspecified: Principal | ICD-10-CM

## 2019-04-09 DIAGNOSIS — N6019 Diffuse cystic mastopathy of unspecified breast: Secondary | ICD-10-CM

## 2019-04-09 DIAGNOSIS — R0683 Snoring: Secondary | ICD-10-CM

## 2019-04-09 DIAGNOSIS — B181 Chronic viral hepatitis B without delta-agent: Secondary | ICD-10-CM

## 2019-04-09 DIAGNOSIS — M542 Cervicalgia: Secondary | ICD-10-CM

## 2019-04-09 DIAGNOSIS — G44229 Chronic tension-type headache, not intractable: Secondary | ICD-10-CM

## 2019-04-09 DIAGNOSIS — E669 Obesity, unspecified: Secondary | ICD-10-CM

## 2019-04-09 DIAGNOSIS — K760 Fatty (change of) liver, not elsewhere classified: Secondary | ICD-10-CM

## 2019-04-09 DIAGNOSIS — M549 Dorsalgia, unspecified: Secondary | ICD-10-CM

## 2019-04-09 DIAGNOSIS — R5383 Other fatigue: Secondary | ICD-10-CM

## 2019-04-09 LAB — HX CHOLESTEROL
HX CHOLESTEROL: 214 mg/dL — ABNORMAL HIGH (ref 110–199)
HX HIGH DENSITY LIPOPROTEIN CHOL (HDL): 41 mg/dL (ref 35–75)
HX LDL: 138 mg/dL — ABNORMAL HIGH (ref 0–129)
HX TRIGLYCERIDES: 177 mg/dL (ref 40–250)

## 2019-04-09 LAB — HX CHEM-LIPIDS
HX CHOL-HDL RATIO: 5.2
HX CHOLESTEROL: 214 mg/dL — ABNORMAL HIGH (ref 110–199)
HX HIGH DENSITY LIPOPROTEIN CHOL (HDL): 41 mg/dL (ref 35–75)
HX HOURS FAST: 0 h
HX LDL: 138 mg/dL — ABNORMAL HIGH (ref 0–129)
HX TRIGLYCERIDES: 177 mg/dL (ref 40–250)

## 2019-04-09 LAB — HX CHEM-PANELS
HX ANION GAP: 7 (ref 3–14)
HX BLOOD UREA NITROGEN: 6 mg/dL (ref 6–24)
HX CHLORIDE (CL): 105 meq/L (ref 98–110)
HX CO2: 28 meq/L (ref 20–30)
HX CREATININE (CR): 0.7 mg/dL (ref 0.57–1.30)
HX GFR, AFRICAN AMERICAN: 119 mL/min/{1.73_m2}
HX GFR, NON-AFRICAN AMERICAN: 103 mL/min/{1.73_m2}
HX POTASSIUM (K): 3.5 meq/L — ABNORMAL LOW (ref 3.6–5.1)
HX SODIUM (NA): 140 meq/L (ref 135–145)

## 2019-04-09 LAB — HX CHEM-LFT
HX ALANINE AMINOTRANSFERASE (ALT/SGPT): 19 IU/L (ref 0–54)
HX ALKALINE PHOSPHATASE (ALK): 82 IU/L (ref 40–130)
HX ASPARTATE AMINOTRANFERASE (AST/SGOT): 16 IU/L (ref 10–42)
HX BILIRUBIN, TOTAL: 0.4 mg/dL (ref 0.2–1.1)

## 2019-04-09 MED ORDER — PHENTERMINE HCL: 0.5 | 20 | 0 refills | 0 days | Status: AC

## 2019-04-09 NOTE — Progress Notes (Signed)
 [facility] April 09, 2019  [address]   [place]  [place] [address]  Main: [phone]  Fax: [phone]  Patient Portal: [name]  [address]  [place], [place]  [address]          MR#: [id number]          DOB: October 28, 1970    To Whom It May Concern:    I am referring [name] [name] for PHYSICAL THERAPY:    Medical problems include:  ? DEPRESSION (ICD-311) (ICD10-F32.9)  PMDD - QUESTION OF (ICD-625.4) (ICD10-N94.3)  MACROMASTIA (ICD-611.1) (ICD10-N62)  FATTY LIVER (ICD-571.8) (ICD10-K76.0)  FATIGUE (ICD-780.79) (ICD10-R53.83)  SNORING (ICD-786.09) (ICD10-R06.83)  MIGRAINE (ICD-346.90) (ICD10-G43.909)      MUSCLE SPASMS OF NECK (ICD-728.85) (ICD10-M62.838)      NECK PAIN, CHRONIC (ICD-723.1) (ICD10-M54.2)      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)      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)  BACK PAIN (ICD-724.5) (ICD10-M54.9)  KNEE PAIN, RIGHT (ICD-719.46) (ICD10-M25.561)  OBESITY, BMI 35-39.9, ADULT (ICD-278.00) (ICD10-E66.9)  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) (ICD10-B18.1)  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)      MAMMOGRAM, ABNORMAL, BILATERAL (ICD-793.80) (ICD10-R92.8)  S/P TOTAL ABDOMINAL HYSTERECTOMY (ICD-V88.01) (ICD10-Z90.710)      UTERINE FIBROIDS (ICD-218.9) (ICD10-D25.9)      IRON DEFICIENCY 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) (ICD10-D50.9)      UMBILICAL HERNIA (ICD-553.1) (LUN27-M18.9)    Diagnosis:   Back pain   right knee pain    Frequency: per PT    Precautions: per PT    Please evaluate and treat as indicated.      Sincerely,      [name] MD -[facility] 4B-  [facility]        Created By [name] MD -[facility] 4B- on 04/09/2019 at 04:25 PM    Electronically Signed By [name] MD -[facility] 4B- on 04/09/2019 at 04:25 PM

## 2019-04-09 NOTE — Progress Notes (Signed)
 [facility] April 09, 2019  [address]   [place]  [place] [address]  Main: [phone]  Fax: [phone]  Patient Portal: [email or web address] April 09, 2019  [address]   [place]  [place] [address]  Main: [phone]  Fax: [phone]  Patient Portal: [email or web address]  [address]  [place], [place]  [address]          MR#: [id number]          DOB: 05-09-1970    To Whom It May Concern:    Patient would benefit from aquatic therapy for management of her MSK pain with obesity.     Medical problems include:  ? MACROMASTIA (ICD-611.1) (ICD10-N62)  MIGRAINE (ICD-346.90) (ICD10-G43.909)      MUSCLE SPASMS OF NECK (ICD-728.85) (ICD10-M62.838)      NECK PAIN, CHRONIC (ICD-723.1) (ICD10-M54.2)      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)      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)  BACK PAIN (ICD-724.5) (ICD10-M54.9)  KNEE PAIN, RIGHT (ICD-719.46) (ICD10-M25.561)  OBESITY, BMI 35-39.9, ADULT (ICD-278.00) (ICD10-E66.9)      Please evaluate and treat as indicated.      Sincerely,      [name] MD -[facility] 4B-  [facility]      Created By [name] MD -[facility] 4B- on 04/09/2019 at 04:28 PM    Electronically Signed By [name] MD -[facility] 4B- on 04/09/2019 at 04:28 PM

## 2019-04-10 ENCOUNTER — Ambulatory Visit: Admit: 2019-04-10 | Payer: Commercial Managed Care - HMO

## 2019-04-10 ENCOUNTER — Ambulatory Visit

## 2019-04-10 ENCOUNTER — Ambulatory Visit: Admitting: Pulmonary Disease

## 2019-04-10 DIAGNOSIS — G4733 Obstructive sleep apnea (adult) (pediatric): Principal | ICD-10-CM

## 2019-04-10 DIAGNOSIS — E669 Obesity, unspecified: Secondary | ICD-10-CM

## 2019-04-10 DIAGNOSIS — G44229 Chronic tension-type headache, not intractable: Secondary | ICD-10-CM
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
## 2019-04-10 NOTE — Progress Notes (Signed)
 [facility] April 10, 2019  [address]   [place]  [place] [address]  Main: [phone]  Fax: [phone]  Patient Portal: [email or web address]            [name] [name]  [address]  [place], [place]  [address]        Dear  [name] [name],    It was nice speaking with you today.  Please see below for a list of therapist recommendations.  I would encourage you to call and try to make an appointment with the therapist or practice that feels like the best fit for you.  Please do not hesitate to reach back out to me with any questions, concerns or need for more options.    Therapist Recommendations:    Vergia Alberts Foley  [address]  [place], [place] [address]  [email or web address]   Phone: [phone]  E-mail: [email or web address]     [facility]  [address], [place], [place] [address]  p. [phone]    [facility] [employer] [facility]  [address] [place], [place] [address]  [email or web address]   [phone]  Email: [email or web address]    [facility]  [address]  [address]  [place], [place] [address]  [phone]    Sincerely,   Ranae Palms, LICSW  [phone]            Created By Ranae Palms LICSW on 04/10/2019 at 01:48 PM    Electronically Signed By Ranae Palms LICSW on 04/10/2019 at 01:48 PM
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
## 2019-04-13 ENCOUNTER — Ambulatory Visit

## 2019-04-13 LAB — HX IMMUNOLOGY
HX ANTI NUCLEAR ANTIBODY SCREEN: NONREACTIVE
HX HEP B VIRAL DNA: 2425 {copies}/mL — AB
HX HEPATITIS B DNA VIRAL LOAD, LOG: 2.85 {Log_IU}/mL — AB
HX HEPATITIS B DNA VIRAL LOAD: 711 [IU]/mL — AB
HX HEPATITIS B SURFACE ANTIBODY: 9.8 m[IU]/mL — ABNORMAL HIGH (ref 0.0–7.9)

## 2019-04-15 ENCOUNTER — Ambulatory Visit

## 2019-04-15 NOTE — Telephone Encounter (Signed)
 RESPONSE/ORDERS:  Called pt to discuss labs - normal/stable. No need to start statin. Unable add on A1c, will need to get POC at next visit. Reviewed sleep note - pt w OSA, will need to get fitted for CPAP. Pt reports her eye appt has been delayed again at [facility] - being rescheduled further to September, requesting alternative ophtho clinic. Recommended she look for physicians through her insurance, and we can provide an OSH referral. Will f/u in April as planned after starting phentermine.   .....................................Marland KitchenMarland KitchenJanalyn Shy MD -[facility] 4B-  April 15, 2019 11:45 AM               ORDERS/PROBS/MEDS/ALL     Problems:   OSA, ADULT (ICD-327.23) [phone])      FATIGUE (ICD-780.79) (ICD10-R53.83)      SNORING (ICD-786.09) (ICD10-R06.83)  DEPRESSION (ICD-311) (ICD10-F32.9)  PMDD - QUESTION OF (ICD-625.4) (ICD10-N94.3)  CHANGES IN VISION (ICD-368.9) (ICD10-H53.9)  MACROMASTIA (ICD-611.1) (ICD10-N62)  FATTY LIVER (ICD-571.8) (ICD10-K76.0)  MOUTH PAIN (ICD-528.9) (UJW11-B14.78)  MIGRAINE (ICD-346.90) (ICD10-G43.909)      MUSCLE SPASMS OF NECK (ICD-728.85) (ICD10-M62.838)      NECK PAIN, CHRONIC (ICD-723.1) (ICD10-M54.2)      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)      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)  BACK PAIN (ICD-724.5) (ICD10-M54.9)  KNEE PAIN, RIGHT (ICD-719.46) (ICD10-M25.561)  OBESITY, BMI 35-39.9, ADULT (ICD-278.00) (ICD10-E66.9)  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) (ICD10-B18.1)  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)      MAMMOGRAM, ABNORMAL, BILATERAL (ICD-793.80) (ICD10-R92.8)  S/P TOTAL ABDOMINAL HYSTERECTOMY (ICD-V88.01) (ICD10-Z90.710)      UTERINE FIBROIDS (ICD-218.9) (ICD10-D25.9)      IRON DEFICIENCY 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) (ICD10-D50.9)      UMBILICAL HERNIA (ICD-553.1) (GNF62-Z30.9)  ANNUAL EXAM - SEPT 2017 (ICD-V70.0) (ICD10-Z00.00)  VACCINE AGAINST INFLUENZA (ICD-V04.8) (QMV78-I69)    Allergies:   PENICILLIN (Critical)  *  CHLOROQUINE (Moderate)  * INJECTAFER (Moderate)    Meds (prior to this call):   MULTIVITAMINS ORAL CAPSULE (MULTIPLE VITAMIN) Take one capsule by mouth once daily; Route: ORAL  IMITREX 50 MG ORAL TABLET (SUMATRIPTAN SUCCINATE) Take one tablet at onset of migraine. If initial dose was partially effective or headache recurs, may repeat a dose after =2 hours; Route: ORAL  PHENTERMINE HCL 37.5 MG ORAL TABLET (PHENTERMINE HCL) Take half a tablet daily; Route: ORAL            Created By [name] MD -[facility] 4B- on 04/15/2019 at 11:40 AM    Electronically Signed By [name] MD -[facility] 4B- on 04/15/2019 at 11:47 AM

## 2019-04-15 NOTE — Progress Notes (Signed)
 [facility]  [address]  [place] [address]  Main: [phone]  Fax: [phone]  Patient Portal: [email or web address]      April 15, 2019            MRN: [id number]            DOB: 12/29/1970   [name] [name]   [address]   [place] [address]      The results of your recent tests are as follows:      Cholesterol Tests:  Your cholesterol is improved from prior - you do not need to start a statin (cholesterol lowering medication), but continued healthy diet and exercise is important to maintain this    Total Cholesterol   214  Normal 110-199    04/09/2019    HDL (good cholesterol)  41  Normal >40     04/09/2019    Triglyceride    177  Normal 40-250    04/09/2019    LDL (bad cholesterol)   138  Normal <160    04/09/2019     Liver Tests:  Normal    ALT:     19  Normal 0 - 54    04/09/2019    AST:     16  Normal 10 - 42    04/09/2019    Total Bili:    0.4  Normal 0.2 - 1.1    04/09/2019    Alk Phos:    82  Normal 40 - 130    04/09/2019     Hepatitis Tests:  Stable viral load    Hep B surface antibody: 9.8   Normal > 8.0   04/09/2019    Hep B viral DNA:   2425   Normal=Not detected  04/09/2019    Hep B viral load:   711   Normal=Not detected  04/09/2019     Kidney Tests:  Normal    BUN:     6  Normal 6 - 24    04/09/2019    Creatinine:    0.70  Normal 0.57 - 1.30   04/09/2019    GFR (Non African-American):  103  Normal >= 60    04/09/2019    GFR (African-American):  119  Normal >= 60    04/09/2019     Electrolyte Tests:  Normal    Sodium:    140 MEQ/L  Normal 135-145   04/09/2019    Potassium:    3.5 MEQ/L  Normal 3.6-5.1   04/09/2019    Chloride:    105 MEQ/L  Normal 98-110   04/09/2019    Bicarbonate:    28 MEQ/L  Normal 20 - 30   04/09/2019       Sincerely,       [name] MD -[facility] 4B-  [facility]    Results Provided by: Mail  Phone         Created By [name] MD -[facility] 4B- on 04/15/2019 at 07:40 AM    Electronically Signed By  [name] MD -[facility] 4B- on 04/15/2019 at 11:39 AM

## 2019-04-16 ENCOUNTER — Ambulatory Visit: Admit: 2019-04-16 | Payer: Commercial Managed Care - HMO

## 2019-04-16 ENCOUNTER — Ambulatory Visit: Admitting: Registered"

## 2019-04-16 ENCOUNTER — Ambulatory Visit

## 2019-04-16 DIAGNOSIS — Z6841 Body Mass Index (BMI) 40.0 and over, adult: Secondary | ICD-10-CM

## 2019-04-16 DIAGNOSIS — Z713 Dietary counseling and surveillance: Secondary | ICD-10-CM
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
## 2019-05-08 ENCOUNTER — Ambulatory Visit

## 2019-05-08 NOTE — Telephone Encounter (Signed)
 RESPONSE/ORDERS:  Called pt as she was scheduled for 1 mo f/u to check BP after phentermine initiation and had to rescheduled. Stopped phentermine 2 wk ago due to side effects - wasn't sleeping well even w CPAP, increased anxiety. Pt wrote down other SE, did not have it readily on hand - had also discussed w her brother in law who is an Administrator, arts. Pt saw [employer] recently, continuing lifestyle changes. Will discuss alternative meds further at f/u in June - ?GLP1  .....................................Marland KitchenMarland KitchenJanalyn Shy MD -[facility] 4B-  May 08, 2019 1:56 PM               ORDERS/PROBS/MEDS/ALL     Problems:   OSA, ADULT (ICD-327.23) [phone])      FATIGUE (ICD-780.79) (ICD10-R53.83)      SNORING (ICD-786.09) (ICD10-R06.83)  DEPRESSION (ICD-311) (ICD10-F32.9)  PMDD - QUESTION OF (ICD-625.4) (ICD10-N94.3)  CHANGES IN VISION (ICD-368.9) (ICD10-H53.9)  MACROMASTIA (ICD-611.1) (ICD10-N62)  FATTY LIVER (ICD-571.8) (ICD10-K76.0)  MOUTH PAIN (ICD-528.9) (HFW26-V78.58)  MIGRAINE (ICD-346.90) (ICD10-G43.909)      MUSCLE SPASMS OF NECK (ICD-728.85) (ICD10-M62.838)      NECK PAIN, CHRONIC (ICD-723.1) (ICD10-M54.2)      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)      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)  BACK PAIN (ICD-724.5) (ICD10-M54.9)  KNEE PAIN, RIGHT (ICD-719.46) (ICD10-M25.561)  OBESITY, BMI 35-39.9, ADULT (ICD-278.00) (ICD10-E66.9)  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) (ICD10-B18.1)  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)      MAMMOGRAM, ABNORMAL, BILATERAL (ICD-793.80) (ICD10-R92.8)  S/P TOTAL ABDOMINAL HYSTERECTOMY (ICD-V88.01) (ICD10-Z90.710)      UTERINE FIBROIDS (ICD-218.9) (ICD10-D25.9)      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 If initial dose was partially effective or headache recurs, may repeat a dose after =2 hours; Route: ORAL  PHENTERMINE HCL 37.5 MG ORAL TABLET (PHENTERMINE HCL) Take half a tablet daily; Route: ORAL            Created By [name] MD -[facility] 4B- on 05/08/2019 at 01:49 PM    Electronically Signed By [name] MD -[facility] 4B- on 05/08/2019 at 01:56 PM

## 2019-05-11 ENCOUNTER — Ambulatory Visit

## 2019-05-11 MED ORDER — SUMATRIPTAN SUCCINATE: 1 | 27 | 1 refills | 0 days | Status: AC

## 2019-05-21 ENCOUNTER — Ambulatory Visit: Admit: 2019-05-21 | Payer: Commercial Managed Care - HMO

## 2019-05-21 ENCOUNTER — Ambulatory Visit: Admitting: Registered"

## 2019-05-21 ENCOUNTER — Ambulatory Visit

## 2019-05-21 DIAGNOSIS — Z6841 Body Mass Index (BMI) 40.0 and over, adult: Secondary | ICD-10-CM

## 2019-05-21 DIAGNOSIS — Z713 Dietary counseling and surveillance: Secondary | ICD-10-CM
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
## 2019-05-21 NOTE — Progress Notes (Signed)
 [name] [name], [name] [name] **DOB:** January 10, 1971 (49 yo F) **Acc No.** [id number] **DOS:**  05/21/2019    ---      **Progress Notes**    ---    **Patient:** [name] [name], [name] [name]     **Account Number:** [id number] **External MRN:** [id number]  **Provider:**  Ramond Darnell     **DOB:** 01-Jan-1971 **Age:** 49 Y **Sex:** Female  **Date:** 05/21/2019     **Phone:** [phone]  **CHN#:** [id number]     **Address:** [address], [place], [id number]     **Pcp:** [name], MD        * * *        **Subjective:**        ---      **Chief Complaints:**    ------      1\. DOX FU JSW AS.    ------     **HPI:**    _GENERAL_ :    Pt is here for nutrition visit #2 as part of the Jumpstart to Wellness  program.    _________    WT HX:    Highest wt: 265 lb    Why wt loss now: Improved health, better mobility, decreased pain    Onset of wt gain: After hysterectomy a few years ago and again since working  from home during pandemic    Wt loss attempts: Reduced portions, avoiding sugar    _________    DIET RECALL:    Wakes at: 8 or 9am    B: Almond milk-1 cup, miso soup OR skips    Sn at 10am: Sometimes "grabs something"    L at 12pm: Snacks or rice w/ goat/fish    Sn: Fruits more recently-mango and pineapple OR nuts and cranberries    D: Soups-sometimes mixes in kale; fish-salmon/tilapia or goat w/ Rice/pasta    Sn at midnight: Leftovers from dinner-large portion or cereal    Fluids: Water-inadequate intake, no juice-used to drink a lot, soda (rarely)    Food preferences: Rice-loves, indigenous foods, loves sugar    _________    HABITS:    Missed meals: Y, Snacks more often than eating meals    Eating speed:    Eating out: Rarely    ETOH: drinks/week    Food logging / logs reviewed: N    Patterns: "Not a morning eater," replacing carbohydrates/sugar with high fiber  foods-psyllium and oatmeal or fruits    _________    GI ISSUES: ; Food allergies/intolerance: None reported    _________    EXERCISE: None, limited movement because of knee  and side pain. [facility]    _________    COMMENTS:    Pt presents during the COVID-19 pandemic / federally declared state of public  health emergency. This service conducted via audio and video via Doximity for  30 minutes. Pt and provider at home. No one was on the call.    Pt on call today for f/u JSW visit. Pt struggling since last visit stating  "things went sideways" and has gained 2 lb. Continues to struggle with late  night eating often choosing large portions of cereal or leftovers from dinner  when she cannot sleep to self-soothe. Received CPAP machine but still  adjusting to it. Also noticed she eats more out of boredom since working from  home and went to [employer] with others recently when normally doesn't eat  fast food. Tried food logging for a few days, but then stopped. Continues to  believe she is not a disciplined person.    However, this past week pt has doubled efforts and seen improvement with diet  and exercise. Purchased wet suit for exercise, because was told it improves  activity by "holding everything together" and has been working out more in it.  Also trying to choose healthier choices, increase veggie intake, and reduce  sugar and carb intake. Looking for support with and suggestions for  substitutions she can make at meals and tips/suggestions related to her  current diet. Not as interested in trying new foods or meals normally doesn't  eat.    ------      **Medical History:**        ------        **Objective:**        ---      **Vitals:** Ht-in 64.5, Wt-lbs 244.7, BMI 41.35, BSA 2.25, Ht-cm 165.1, Wt-  kg 111, Wt Change 2 lb.    ------         **Assessment:**        ---      **Assessment:**        1\. Morbid obesity due to excess calories - E66.01 (Primary)    2\. Dietary counseling and surveillance - Z71.3    3\. BMI 40.0-44.9, adult - Z68.41    ------     Pt is here for the Jumpstart to Wellness Program.    _________    WT CHANGES:  Up 2 lb    _________    NUTR. NEEDS:    Protein/day: 90 grams (0.8g/kg actual body weight)    Fluids: 64 oz./day    _________    NUTR. GOALS:    Exceeding calorie needs: Y    Meeting protein needs: Y    Meeting fluid needs: N    Assessment: Pt without structure or consistency with meals and snacks, craves  sugar, struggles with late night eating for comfort    _________    NUTR DX:    1.Obesity secondary to excessive energy intake, environmental factors as  evidenced by pt interview, BMI > 30.    _________    EDUCATION:    -Validated pt's concerns about wt gain, emphasized importance of support from others , and educated on idea of eating smarter not harder or less    -Suggested MWL program for team's support    -Suggested different strategies to help decrease late night eating and self-soothe without food    -Emphasized value of food logging for self-awareness, mindfulness, and accountability    -Educated on nutrient dense foods and filling up on more of them in place of foods like cereal    -Discussed importance of avoiding negative selftalk for long term success    SMART Goals-    1\. Food logging-Start with one meal daily    2\. Continue with exercise in wetsuit but remaining cautious of signs of heat  distress. Avoid when gets hotter outside    3\. When not able to sleep try:    - hot herbal tea such as ones that include chamomile, ashwagandha, or  lavendar.    -brushing teeth again     -Taking warm/hot shower or bath    -Having low-sodium broth or broth-based veggie soup    4\. Avoid having cereal, best to remove from house for now.         **Plan:**        ---       **1\. Others**  Clinical Notes: Met with patient for 30 min of one-on-one, individual  nutrition therapy. Keep daily food and exercise logs. Aim to achieve  designated calorie and protein recommendations in order to promote estimated  weight loss of 1-2 lbs per week. Eat every 3-4 hours starting w/ breakfast.  Include a good source of protein  and fiber with all meals and snacks to help  with hunger control. Create balanced meals using the Plate Method as a guide.  Identify future weight loss goals after completion of 3 RD visits.    ---     **Procedure Codes:** [id number] MED NUTRITION, INDIV, SUBSEQ, Units: 2.00 ,  Modifiers: GT    ------     **Follow Up:** 4 Weeks    ------    ---    ---                ---    Electronically signed by Rosalin Hawking on 05/21/2019 at 07:20 PM EDT    Sign off status: Completed          * * *      **Provider:** Devota Viruet  **Date:** 05/21/2019    ------

## 2019-06-25 ENCOUNTER — Ambulatory Visit

## 2019-06-25 NOTE — Progress Notes (Signed)
 [facility] June 25, 2019  [address]   [place]  [place] [address]  Main: [phone]  Fax: [phone]  Patient Portal: [email or web address]                    [name] [name]  [address]  [place], [place]  [address]                DOB:           MR#: [id number]  Dear  [name],      I am writing this letter to inform you that I will be leaving [facility] as of July 10, 2019. 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    My continuity clinic practice will be taken over by Dr. Leonard Downing Ravipati, a new intern who will begin on July 06, 2019. Please call my coordinator at [phone], in order to arrange an appointment to meet your new doctor.  If you have changed primary care physicians or left the area, please let us know so we can update our records.    It has been a pleasure serving you. I will miss being your doctor, and wish you good health in the future.      Sincerely,      [name] MD -[facility] 4B-  Division of Internal Medicine  [facility]        Created By Maryan Puls on 06/25/2019 at 03:36 PM    Electronically Signed By Maryan Puls on 06/25/2019 at 03:36 PM

## 2019-06-30 ENCOUNTER — Ambulatory Visit

## 2019-06-30 NOTE — Progress Notes (Signed)
 [facility] June 30, 2019  [address]   [place]  [place] [address]  Main: [phone]  Fax: [phone]  Patient Portal: [email or web address]                    06/30/2019  RE: [name] [name]  [address]  [place], [place]  [address]          MR#: [id number]          DOB: Jun 24, 1970    Analysia Bolender, has been a patient under my care in General Medicine at [facility]. This document serves to facilitate their primary care transition to their new physician.    This patient has the following active issues:   Diabetes   Last HbA1c:  5.0 10/15/2018   Blood pressure: 108 / 72 04/09/2019   LDL: 138 04/09/2019   Microalbumin:     Eye exam:    Managed by:    Next scheduled appt: <None>       Coumadin:   Indication:    Duration:    Managed by [facility] or outside?        Opioid Management:   Indication:    Name of prescriber:    Last Urine Tox screen (results, date):     Pt's current regimen, refill date:       Other important management issues for this patient include:  ? Multiple comorbidites with morbid obesity BMI 42. Newly dx OSA 2021, needs weight loss to get breast reduction for severe back pain. Already seeing [employer] nutritionist, not helping. Did not tolerate phentermine, consider GLP1   Intermittent no shows - difficult to take time off work.    Upcoming health maintenance issues and outstanding tests (quicktexts .hm1, .hm3):  ? ignore protocol reminder for Pap - s/p total abd hysterectomy    This patient's full list of medical problems and medications are as follows:  Problems:  OSA, ADULT (ICD-327.23) (ICD10-G47.33)      FATIGUE (ICD-780.79) (ICD10-R53.83)      SNORING (ICD-786.09) (SMO70-B86.75)  DEPRESSION (ICD-311) (ICD10-F32.9)  PMDD - QUESTION OF (ICD-625.4) (ICD10-N94.3)  CHANGES IN VISION (ICD-368.9) (ICD10-H53.9)  MACROMASTIA (ICD-611.1) (ICD10-N62)  FATTY LIVER (ICD-571.8) (ICD10-K76.0)  MOUTH PAIN (ICD-528.9) (QGB20-F00.71)  MIGRAINE (ICD-346.90)  (ICD10-G43.909)      MUSCLE SPASMS OF NECK (ICD-728.85) (ICD10-M62.838)      NECK PAIN, CHRONIC (ICD-723.1) (ICD10-M54.2)      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)      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)  BACK PAIN (ICD-724.5) (ICD10-M54.9)  KNEE PAIN, RIGHT (ICD-719.46) (ICD10-M25.561)  OBESITY, BMI 35-39.9, ADULT (ICD-278.00) (ICD10-E66.9)  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, now immune  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)      MAMMOGRAM, ABNORMAL, BILATERAL (ICD-793.80) (ICD10-R92.8)  S/P TOTAL ABDOMINAL HYSTERECTOMY (ICD-V88.01) (ICD10-Z90.710)      UTERINE FIBROIDS (ICD-218.9) (ICD10-D25.9)      IRON DEFICIENCY 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) (ICD10-D50.9)      UMBILICAL HERNIA (ICD-553.1) (QRF75-O83.9)  ANNUAL EXAM - SEPT 2017 (ICD-V70.0) (ICD10-Z00.00)  VACCINE AGAINST INFLUENZA (ICD-V04.8) (ICD10-Z23)    Medications:  MULTIVITAMINS ORAL CAPSULE (MULTIPLE VITAMIN) Take one capsule by mouth once daily; Route: ORAL  SUMATRIPTAN SUCC 50 MG TABLET (SUMATRIPTAN SUCCINATE) TAKE 1 TABLET AT ONSET OF MIGRAINE, MAY REPEAT AFTER 2 HOURS IF NEEDED  PHENTERMINE HCL 37.5 MG ORAL TABLET (PHENTERMINE HCL) Take half a tablet daily; Route: ORAL    Allergies:  PENICILLIN (Critical)  * CHLOROQUINE (Moderate)  * INJECTAFER (Moderate)  For additional information please check patient flow sheets or call [phone].      Signed:      [name] MD -[facility] 4B-  [facility]          Created By [name] MD -[facility] 4B- on 06/30/2019 at 05:46 PM    Electronically Signed By [name] MD -[facility] 4B- on 06/30/2019 at 05:51 PM

## 2019-06-30 NOTE — Progress Notes (Signed)
 ORDERS/PROBS/MEDS/ALL     Problems:   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) (ICD10-G47.33)      FATIGUE (ICD-780.79) (ICD10-R53.83)      SNORING (ICD-786.09) (UGA48-E72.07)  DEPRESSION (ICD-311) (ICD10-F32.9)  PMDD - QUESTION OF (ICD-625.4) (ICD10-N94.3)  CHANGES IN VISION (ICD-368.9) (ICD10-H53.9)  MACROMASTIA (ICD-611.1) (ICD10-N62)  FATTY LIVER (ICD-571.8) (ICD10-K76.0)  MIGRAINE (ICD-346.90) (ICD10-G43.909)      MUSCLE SPASMS OF NECK (ICD-728.85) (ICD10-M62.838)      NECK PAIN, CHRONIC (ICD-723.1) (ICD10-M54.2)      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)      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)  BACK PAIN (ICD-724.5) (ICD10-M54.9)  KNEE PAIN, RIGHT (ICD-719.46) (ICD10-M25.561)  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)      MAMMOGRAM, ABNORMAL, BILATERAL (ICD-793.80) (ICD10-R92.8)  S/P TOTAL ABDOMINAL HYSTERECTOMY (ICD-V88.01) (ICD10-Z90.710)      UTERINE FIBROIDS (ICD-218.9) (ICD10-D25.9)      IRON DEFICIENCY 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) (ICD10-D50.9)      UMBILICAL HERNIA (ICD-553.1) (KTC28-Q33.9)  ANNUAL EXAM - SEPT 2017 (ICD-V70.0) (ICD10-Z00.00)  VACCINE AGAINST INFLUENZA (ICD-V04.8) (VOU51-Q60)    Allergies:   PENICILLIN (Critical)  * CHLOROQUINE (Moderate)  * INJECTAFER (Moderate)    Meds (prior to this call):   MULTIVITAMINS ORAL CAPSULE (MULTIPLE VITAMIN) Take one capsule by mouth once daily; Route: ORAL  SUMATRIPTAN SUCC 50 MG TABLET (SUMATRIPTAN SUCCINATE) TAKE 1 TABLET AT ONSET OF MIGRAINE, MAY REPEAT AFTER 2 HOURS IF NEEDED  PHENTERMINE HCL 37.5 MG ORAL TABLET (PHENTERMINE HCL) Take half a tablet daily; Route: ORAL      Past, Family, and Social History  Past History (reviewed - no changes required): OSA on CPAP - dx 2021  chronic hepatitis B - cleared/immune 2021 (saw ID)  hepatic steatosis  obesity - sees [facility]  fibrocystic breast disease  mid-back pain 2/2 accident  migraines / tension HA - sees neuro  IDA and uterine fibroids s/p  TAH    Surgical: 04/2016 - TAH+BS and umbilical hernia repair      Services Due: PATPORTALPIN, PAP SMEAR, PHQ-9 SCORE.      Items recorded during this note:  Added new observation of PMH REVIEWED: reviewed - no changes required (06/30/2019 17:51)  Added new observation of PAST MED HX: OSA on CPAP - dx 2021  chronic hepatitis B - cleared/immune 2021 (saw ID)  hepatic steatosis  obesity - sees [facility]  fibrocystic breast disease  mid-back pain 2/2 accident  migraines / tension HA - sees neuro  IDA and uterine fibroids s/p TAH    Surgical: 04/2016 - TAH+BS and umbilical hernia repair (06/30/2019 17:51)  Added new observation of PROBLEMS REV: Done (06/30/2019 17:51)          Immunization Worksheet 2019 (rev 04/22/2019)         Save Prior PCP  This module is to save a prior PCP, to assist with future appointments. Also to help set Care Manager.           Created By [name] MD -[facility] 4B- on 06/30/2019 at 05:51 PM    Electronically Signed By [name] MD -[facility] 4B- on 06/30/2019 at 05:55 PM

## 2019-07-02 ENCOUNTER — Ambulatory Visit: Admit: 2019-07-02 | Payer: Commercial Managed Care - HMO

## 2019-07-02 ENCOUNTER — Ambulatory Visit

## 2019-07-02 ENCOUNTER — Ambulatory Visit: Admitting: Pulmonary Disease

## 2019-07-02 DIAGNOSIS — G4733 Obstructive sleep apnea (adult) (pediatric): Principal | ICD-10-CM

## 2019-07-02 DIAGNOSIS — G44229 Chronic tension-type headache, not intractable: Secondary | ICD-10-CM

## 2019-07-02 DIAGNOSIS — Z6841 Body Mass Index (BMI) 40.0 and over, adult: Secondary | ICD-10-CM

## 2019-07-02 NOTE — Progress Notes (Signed)
 * * *    [name] [name], [name] [name] **DOB:** 12/18/1970 (49 yo F) **Acc No.** [id number] **DOS:**  07/02/2019    ---       [name] [name]**    ------    42 Y old Female, DOB: 21-Apr-1970, External MRN: [id number]    Account Number: [id number]    [address], [place], [place]    Home: [phone]    Insurance: [employer] [place]    PCP: [name] Shy, MD Referring: [name] Shy, MD    [facility]        * * *    07/02/2019 Progress Notes: [name], MD **CHN#:** [id number]    ------    ---       **Current Medications**    ---    Taking    * Multivitamin Capsule Oral     ---    * SUMAtriptan Succinate 50 MG Tablet 1 tablet at least 2 hours between doses as needed Orally Twice a day    ---    Not-Taking/PRN    * Centrum - Tablet Orally , Notes: PRN    ---    * Excedrin Migraine     ---    * iron 1 tab Oral , Notes: PRN    ---    * Lexapro 5 MG Tablet 1 tablet Orally Once a day    ---      **Social History**    ---    Tobacco history: Never smoked.    Domestic Abuse Have you ever experienced abuse either verbal, physical or  sexual? No.    Marital Status  Single.    Caffeine: 1 cup.    Work/Occupation: Futures trader.    Exercise: Walks regularly.    Alcohol  Denies.    Illicit drugs: Never.  Moved here from [place] in 2002. She is a Air cabin crew. No tobacco, drugs, ETOH.    ---       **Reason for Appointment**    ---      1\. CPAP F/U    ---    25\. 49 year old woman with moderate OSA for follow up    ---      **Assessments**    ---    1\. Obstructive sleep apnea (adult) (pediatric) - G47.33 (Primary), The  patient had HST, which showed moderate OAD with hypoxemia and snoring. The  patient is having some issues with mask and machine pressure, which may be  improved with a new mask fitting, ordered. The patient was educated about the  complications of sleep apnea including cardiovascular disease, neurological  disorders and disorders of glucose and testosterone metabolism as  well as  improvement of excessive daytime sleepiness, of concentration and of quality  of life. The patient was advised to never drive when drowsy and to avoid  alcohol near to bedtime.    ---    2\. Chronic tension-type headache, not intractable - G44.229, Hopefully,  morning HA will resolved with CPAP    ---    3\. BMI 40.0-44.9, adult - Z68.41    ---      **Treatment**    ---      **1\. Obstructive sleep apnea (adult) (pediatric)**    Notes: Consent: Telemedicine informed consent form provided to and reviewed  with patient. Verbal consent from the patient for a telemedicine visit was  obtained at the time of the service. The patient understands not all  conditions can be adequately evaluated and  treated through a virtual visit and  may require an in-person medical evaluation. The patient understands that  there may be co-pay / cost for the visit.    Patient verbal consent obtained by Provider: Yes    Telemedicine Communication method: Doximty video    The physical location of the patient home in [place]    The physical location of the provider office in [place]    The names of all persons participating in the telemedicine service and their  role in the encounter. (if family members are in video) none    Greater than 50% of the 84' time spent was devoted to counseling and  coordinating care including review of records, pertinent lab data and studies,  as well as discussing diagnostic evaluation and workup, planned therapeutic  interventions and future disposition of care.    ---     **Follow Up**    ---    3 Months      **History of Present Illness**    ---     _GENERAL_ :    The patient had HST, which showed moderate OAD with hypoxemia and snoring. WE  discussed treatment with AutoSet CPAP with mask fitting and heated  humidification.    07/02/19    Feeling ill, flu, had Covid-19. Fever, chills, myalgia.    cough without sputum, nausea, no vomiting, diarrhea    Has CPAP    Using 2/3 time, for 5-6, had trouble  with adjustments.    Water in the tube. Having issues with the machine. Had to change the mask. Had  nasal and FFM have air leakage. Snoring. Quality of sleep is fair    Awakening refreshed in AM, HA resolved, better in AM, while using mask.    Needs concentration for work.    The data indicates usage at 67% for 5 hr with AHI=1.2 but with a marked amount  of air leakage which may be causing her distress.      **Examination**    ---     _Pulmonary:_    General Appearance: no apparent distress, alert and oriented x 3, in bed, ill.    Respiratory Effort: No use of accessory muscles.        Electronically signed by [name] , MD on 07/04/2019 at 04:01 PM EDT    Sign off status: Completed        * * *        [facility]    [address]    [place] , [place] [address]    Tel: [phone]    Fax: [phone]              * * *          Progress Note: [name], MD 07/02/2019    ---    Note generated by eClinicalWorks EMR/PM Software ([email or web address])

## 2019-07-02 NOTE — Progress Notes (Signed)
 .  Progress Notes  .  Patient: [name] [name]  Provider: OSTROW, PETER    .  DOB:1970-07-20 Age: [name]  .  PCP: [name]  MD  Date: 07/02/2019  .  --------------------------------------------------------------------------------  .  REASON FOR APPOINTMENT  .  1. CPAP F/U  .  34. 49 year old woman with moderate OSA for follow up  .  HISTORY OF PRESENT ILLNESS  .  GENERAL:   The patient had HST, which showed moderate OAD with hypoxemia  and snoring. WE discussed treatment with AutoSet CPAP with mask  fitting and heated humidification. 6/17/21Feeling ill, flu, had  Covid-19. Fever, chills, myalgia. cough without sputum, nausea,  no vomiting, diarrheaHas CPAPUsing 2/3 time, for 5-6, had trouble  with adjustments. Water in the tube. Having issues with the  machine. Had to change the mask. Had nasal and FFM have air  leakage. Snoring. Quality of sleep is fairAwakening refreshed in  AM, HA resolved, better in AM, while using mask. Needs  concentration for work.The data indicates usage at 67% for 5 hr  with AHI=1.2 but with a marked amount of air leakage which may be  causing her distress.  .  CURRENT MEDICATIONS  .  Taking Multivitamin Capsule Oral  Taking SUMAtriptan Succinate 50 MG Tablet 1 tablet at least 2  hours between doses as needed Orally Twice a day  Not-Taking/PRN Centrum - Tablet Orally , Notes: PRN  Not-Taking/PRN Excedrin Migraine  Not-Taking/PRN iron 1 tab Oral , Notes: PRN  Not-Taking/PRN Lexapro 5 MG Tablet 1 tablet Orally Once a day  .  ALLERGIES  .  yes[Allergies Verified]  .  SOCIAL HISTORY  .  .  Tobaccohistory:Never smoked.  .  Domestic AbuseHave you ever experienced abuse either verbal,  physical or sexual?No.  .  Marital Status Single.  .  Caffeine: 1 cup.  .  Work/Occupation: Futures trader.  .  Exercise: Walks regularly.  .  Alcohol Denies.  .  Illicit drugs: Never.  .  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 No  tobacco, drugs,  ETOH.  [name]  EXAMINATION  .  Pulmonary:  General Appearance:no apparent distress, alert and oriented x 3,  in bed, ill.  Respiratory Effort:No use of accessory muscles.  .  ASSESSMENTS  .  Obstructive sleep apnea (adult) (pediatric) - G47.33 (Primary),  The patient had HST, which showed moderate OAD with hypoxemia and  snoring. The patient is having some issues with mask and machine  pressure, which may be improved with a new mask fitting, ordered.  The patient was educated about the complications of sleep apnea  including cardiovascular disease, neurological disorders and  disorders of glucose and testosterone metabolism as well as  improvement of excessive daytime sleepiness, of concentration and  of quality of life. The patient was advised to never drive when  drowsy and to avoid alcohol near to bedtime.  .  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  BMI 40.0-44.9, adult - Z68.41  .  TREATMENT  .  Obstructive sleep apnea (adult) (pediatric)  Notes: Consent: Telemedicine informed consent form provided to  and reviewed with patient. Verbal consent from the patient for a  telemedicine visit was obtained at the time of the service. The  patient understands not all conditions can be adequately  evaluated and treated through a virtual visit and may require an  in-person medical evaluation. The patient  understands that there  may be co-pay / cost for the visit.  Patient verbal consent obtained by Provider: Yes  Telemedicine Communication method: Doximty video  The physical location of the patient home in [place]  The physical location of the provider office in [place]  The names of all persons participating in the telemedicine  service and their role in the encounter. (if family members are  in video) none  Greater than 50% of the 26' time spent was devoted to counseling  and coordinating care including review of records, pertinent lab  data and studies, as  well as discussing diagnostic evaluation and  workup, planned therapeutic interventions and future disposition  of care.  .  FOLLOW UP  .  3 Months  .  Electronically signed by [name] , MD on  07/04/2019 at 04:01 PM EDT  .  Document electronically signed by [name]    .

## 2019-07-13 ENCOUNTER — Ambulatory Visit: Admit: 2019-07-13 | Payer: Commercial Managed Care - HMO

## 2019-07-13 ENCOUNTER — Ambulatory Visit

## 2019-07-13 ENCOUNTER — Ambulatory Visit: Admitting: Internal Medicine

## 2019-07-13 DIAGNOSIS — F329 Major depressive disorder, single episode, unspecified: Secondary | ICD-10-CM

## 2019-07-13 DIAGNOSIS — E669 Obesity, unspecified: Principal | ICD-10-CM

## 2019-07-13 DIAGNOSIS — G4733 Obstructive sleep apnea (adult) (pediatric): Secondary | ICD-10-CM
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
## 2019-07-13 NOTE — Progress Notes (Signed)
 [facility]  [address]  [place] [address]  Main: [phone]  Fax: [phone]  Patient Portal: [email or web address]        July 13, 2019            MRN: [id number]            DOB: [birth year]   [name] [name]   [address]   [place] [address]      I am writing to let you know that the following appointments have been made for you.       Gynecology  [facility]  Your appointment is scheduled for 07/31/2019 4:00 PM  You are scheduled to see Antony Salmon  If you need to make any changes, please call [phone]            Sincerely,         [name]  [facility]          Created By [name] on 07/13/2019 at 05:19 PM    Electronically Signed By [name] on 07/13/2019 at 05:20 PM

## 2019-07-14 ENCOUNTER — Ambulatory Visit

## 2019-07-21 ENCOUNTER — Ambulatory Visit

## 2019-07-27 ENCOUNTER — Ambulatory Visit: Admit: 2019-07-27 | Payer: Commercial Managed Care - HMO

## 2019-07-27 ENCOUNTER — Ambulatory Visit

## 2019-07-27 ENCOUNTER — Ambulatory Visit (HOSPITAL_BASED_OUTPATIENT_CLINIC_OR_DEPARTMENT_OTHER): Admitting: Psychiatry

## 2019-07-27 ENCOUNTER — Ambulatory Visit: Admitting: Obstetrics & Gynecology

## 2019-07-27 DIAGNOSIS — N6001 Solitary cyst of right breast: Secondary | ICD-10-CM

## 2019-07-27 DIAGNOSIS — Z01419 Encounter for gynecological examination (general) (routine) without abnormal findings: Principal | ICD-10-CM

## 2019-07-27 DIAGNOSIS — F329 Major depressive disorder, single episode, unspecified: Secondary | ICD-10-CM

## 2019-07-27 MED ORDER — Sertraline HCl: 25 | tablets | Freq: Every day | ORAL | 2 refills | 0 days | Status: AC
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
## 2019-08-26 ENCOUNTER — Ambulatory Visit: Admitting: Pulmonary Disease

## 2019-08-26 NOTE — Progress Notes (Signed)
* * *      Khalid, Mora **DOB:** 02/24/1970 (49 yo F) **Acc No.** [id number] **DOS:**  08/26/2019    ---       [name], Wilfred**    ------    28 Y old Female, DOB: December 31, 1970    [address], [place], [place] [address]    Home: [phone]    Provider: [name]        * * *    Telephone Encounter    ---    Answered by  Laurel Dimmer Date: 08/26/2019       Time: 02:53 PM    Caller  pt    ------            Reason  needs call back re letter            Message                     Pt left a VM asking for a call back. She said she is requesting a reconsideration based on a letter she received                Action Taken                     Balbuena,Corey  08/26/2019 2:54:07 PM >      Daizha Anand  08/26/2019 3:21:01 PM > bad connection.      Xzayvier Fagin  08/26/2019 3:22:19 PM > about using CPAP. Got new mask. Went to [employer] without machine, thus, no usage. Wants to try again. Sent email to [employer]                    * * *                ---          * * *         Provider: [name] 08/26/2019    ---    Note generated by eClinicalWorks EMR/PM Software ([email or web address])

## 2019-09-03 ENCOUNTER — Ambulatory Visit: Admit: 2019-09-03 | Payer: Commercial Managed Care - HMO

## 2019-09-03 ENCOUNTER — Ambulatory Visit: Admitting: Internal Medicine

## 2019-09-03 ENCOUNTER — Ambulatory Visit

## 2019-09-03 DIAGNOSIS — R928 Other abnormal and inconclusive findings on diagnostic imaging of breast: Principal | ICD-10-CM

## 2019-10-20 ENCOUNTER — Ambulatory Visit

## 2019-10-20 NOTE — Progress Notes (Signed)
 [facility] October 20, 2019  [address]   [place]  [place] [address]  Main: [phone]  Fax: [phone]  Patient Portal: [name]  [address]  [place], [name]      Dear  Ms. [name] [name],      I am writing to let you know that your primary care doctor is trying to have an Physical Exam appointment with you.    Please call [phone] to schedule your appointment. I will be happy to assist you.    We look forward to seeing you soon!      Sincerely,          [name] / Office of Prasanti Ravipati MD -[facility] 4A-,   [facility]                  Created By [name] on 10/20/2019 at 11:20 AM    Electronically Signed By [name] on 10/20/2019 at 11:21 AM

## 2020-02-18 ENCOUNTER — Ambulatory Visit: Admit: 2020-02-18 | Payer: Commercial Managed Care - HMO

## 2020-02-18 ENCOUNTER — Ambulatory Visit: Admitting: Internal Medicine

## 2020-02-18 ENCOUNTER — Ambulatory Visit

## 2020-02-18 DIAGNOSIS — Z23 Encounter for immunization: Secondary | ICD-10-CM

## 2020-02-18 DIAGNOSIS — Z0000 Encounter for general adult medical examination without abnormal findings: Principal | ICD-10-CM

## 2020-02-18 NOTE — Progress Notes (Signed)
 [facility] February 18, 2020  [address]   [place]  [place] [address]  Main: [phone]  Fax: [phone]  Patient Portal: [name]  [address]  [place], [place]  [address]          MR#: [id number]          DOB: Sep 21, 1970    To Whom It May Concern:    I am referring [name] [name] for PHYSICAL THERAPY:    Medical problems include:  ?   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)      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)  BACK PAIN (ICD-724.5) (ICD10-M54.9)  KNEE PAIN, RIGHT (ICD-719.46) (ICD10-M25.561)  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      Sincerely,      Madelline Eshbach MD -[facility] 4A-  [facility]        Created By Blake Divine MD -[facility] 4A- on 02/18/2020 at 03:01 PM    Electronically Signed By Blake Divine MD -[facility] 4A- on 02/18/2020 at 03:01 PM

## 2020-02-18 NOTE — Progress Notes (Signed)
 General Medicine Visit - Resident note with preceptor addendum  .  PATPORTALPIN, PHQ-9 SCORE, PHQ-9 SCORE.    .  * [place]: Adline Peals)  .  Patient Details and Vitals  Patient reviewed in/by:  Office  .  BMI: 43.31  .  BP: 119/ 79  Ht (inches): 64  with shoes  Weight: 251.4  BMI: 43.31  Temp: 97.5  Pulse: 86  .  Med List: PRINTED by [facility] for patient   hd_medl: printed  .  [name]  Patient Medical History   Travel outside of the [place] in past 28 days:: No  .  In the past year, have you ... Had no falls  Difficulty with balance? NO  Use a Cane NO  Use a Walker NO  Need assistance with ambulation while here? NO  .  [name]  COVID Patient Symptom Screening   Note any symptoms in the prior 10 days:   In past 14 days, exposure to anyone with above symptoms or positive test?   COVID-19 Exposure: No  In past 14 days, told by public health authoirty or healthcare professional   to quarantine?   COVID-19 Quarantine: No  COVID Vaccine; remind availability for walk-in vaccines. If NO, patient mus  t wear mask..   Been FULLY vaccinated for COVID-19: Yes  COVID Vaccine [place]: ask patient if received the Booster shot?   Booster shot for COVID-19: Yes  Tobacco use? never smoker  Does patient experience chronic pain ? NO  .  Patient Screening   .  .  .  .  .  .  .  .  .  .  Data to be shared to Telemed/Office Visits   February 18, 2020 1:57 PM  Screening [place]: Johny Drilling Marylene Land)  .  ---------- ---------- ----------   .  .....................................Marland KitchenMarland KitchenAdline Peals  February 18, 2020 1:57   PM  .  .  **Resident Note**  .  .  Patient Prep Updates   [facility] Pre-Visit Notes:  February 18, 2020 1:57 PM  Screening [place]: Johny Drilling Marylene Land)  .  ---------- ---------- ----------   .  [name]  Population Health Notes:  .  SDOH, Food Insecurity: Sometimes true (04/09/2019 3:28:51 PM)  .  .  [name]  * Preceptor: Renaldo Reel Belenda Cruise)  CC: Physical exam  .  HPI: Patient w/ hx of CPAP, migraine, obesity presents for annual physical   exam.   .  Traveled and left her CPAP in [place] visited sister and mom so she ha  sn't been able to use it. So she does have a headache at night that usually   improves in the morning once drinking water.   .  She is taking sumatriptan for her headaches which helps. She needs a refill.  .  She expresses a lot of frustration with her weight and diet. She has lost 1  5 pounds but then regained and has been unable to keep weight off. She endo  rses constant sugar craving. She has beeen unable to continue swimming clas  ses since [facility] had a problem with their pool. Due to her sugar cravings   she is unable to focus or do work unless she eats sugar.  .  She states that right shoulder pain, knee pain and back pain have been prev  enting her from exercising. She notes pain throughout the day and sometimes   pain is so bad she cannot move. She  takes tylenol and alternates with alev  e for the pain which helps.  .  3 weeks ago she had sore throat and cough with fatigue, COVID was negative   but she got tested 2 weeks after sx onset. She feels better now.   .  .  Past Medical History:(reviewed)  OSA on CPAP - dx 2021  chronic hepatitis B - cleared/immune 2021 (saw ID)  hepatic steatosis  obesity - sees W/T/W  fibrocystic breast disease  mid-back pain 2/2 accident  migraines / tension HA - sees neuro  IDA and uterine fibroids s/p TAH  Surgical: 04/2016 - TAH+BS and umbilical hernia repair  .  Family History: (reviewed)   Mom- died age 50 due to complications of diabetes, also had HTN  Dad- alive, healthy  Grandparents- UNK  Sister with uterine fibroids and grave's disease  Sister with asthma  No children  .  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  Exercise: not currently exercising, used to swim  Sexual history: not currently sexually active, has been in the past with me  n.  No history of STIs.  Last Pap smear April 2017, normal.  No history of   abnormal Pap smears.  Last mammogram 02/2019, normal except for fibrocystic   breast disease, breast US with possible abnormal right breast cyst. repeat   u/s 03/03/2020  Vaccines: TDAP 2017. COVID vaccine x 3 01/2019  .  Review of Systems: Denies dizziness, lightheadedness, SOB, chest pain, sore   throat, congestion, cough, hematuria, blood in stool, abdominal pain, N/V/  D/C, fevers or chills.   .  .  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)      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)  BACK PAIN (ICD-724.5) (ICD10-M54.9)  KNEE PAIN, RIGHT (ICD-719.46) (ICD10-M25.561)  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)      MAMMOGRAM, ABNORMAL, BILATERAL (ICD-793.80) (ICD10-R92.8)  S/P TOTAL ABDOMINAL HYSTERECTOMY (ICD-V88.01) (ICD10-Z90.710)      UTERINE FIBROIDS (ICD-218.9) (ICD10-D25.9)      IRON DEFICIENCY 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  [name]  MEDICATIONS:   PAST MEDICINES (prior to today's visit):  MULTIVITAMINS ORAL CAPSULE (MULTIPLE VITAMIN) Take one capsule by mouth onc  e daily; Route: ORAL  SUMATRIPTAN SUCC 50 MG TABLET (SUMATRIPTAN SUCCINATE)  TAKE 1 TABLET AT ONSE  T OF MIGRAINE, MAY REPEAT AFTER 2 HOURS IF NEEDED  PHENTERMINE HCL 37.5 MG ORAL TABLET (PHENTERMINE HCL) Take half a tablet da  ily; Route: ORAL  .  MEDICINE CHANGES:  Changed medication from MULTIVITAMINS ORAL CAPSULE (MULTIPLE VITAMIN) Take   one capsule by mouth once daily; Route: ORAL to * MULTIVITAMINS ORAL CAPSUL  E Take 1 capsule by mouth once a day ; Route: BY MOUTH - Signed  Changed medication from PHENTERMINE HCL 37.5 MG ORAL TABLET (PHENTERMINE HC  L) Take half a tablet daily; Route: ORAL to phentermine 37.5 mg tablet (phe  ntermine) Take 0.5 tablet by mouth once a day ; Route: BY MOUTH - Signed  Changed medication from SUMATRIPTAN SUCC 50 MG TABLET (SUMATRIPTAN SUCCINAT  E) TAKE 1 TABLET AT ONSET OF MIGRAINE, MAY REPEAT AFTER 2 HOURS IF NEEDED t  o sumatriptan succinate 50 mg tablet (sumatriptan succinate) Take 1 tablet   by mouth as needed ; Route: BY MOUTH - Signed  Added new medication of sumatriptan succinate 50 mg tablet (sumatriptan suc  cinate) Take 1 tablet by mouth at onset of headache. if headache persists a  fter 2 hours, may take 1 more tablet; Route: BY MOUTH - Signed  Removed medication of sumatriptan succinate 50 mg tablet (sumatriptan succi  nate) Take 1 tablet by mouth as needed ; Route: BY MOUTH - Signed  Removed medication of phentermine 37.5 mg tablet (phentermine) Take 0.5 tab  let by mouth once a day ; Route: BY MOUTH - Signed  Rx of sumatriptan succinate 50 mg tablet (sumatriptan succinate) Take 1 tab  let by mouth at onset of headache. if headache persists after 2 hours, may   take 1 more tablet;  #8 tablet x 3;  Signed;  Entered  by: Blake Divine   MD -[facility] 4A-;  Authorized by: Blake Divine MD -[facility] 4A-;  Method used: El  ectronically to [facility] [phone] [address] [place], [place] [address], Ph  : [phone] Fax: [phone],   .  [name]  ALLERGIES:   PAST ALLERGIES (prior to today's visit):  PENICILLIN (Critical)  * CHLOROQUINE (Moderate)  * INJECTAFER (Moderate)  .  Allergies Reviewed:  Done  .  [name]  DIRECTIVES:   .  .  .  Vitals:   BP: 119 / 79 mmHg Ht: 64 in.  Wt: 251.4 lbs.  BMI (in-lb) 43.31  Temp: 97.5deg F.   Pulse Rate: 86 bpm  .  .  Additional PE: Constitutional: Alert, no acute distress, well hydrated, wel  l developed  Skin: Normal turgor, normal color, no rashes, no suspicious lesions, no unu  sual bruising  Head: atraumatic, normocephalic  Eyes: EOM intact, no injection, no icterus  Ear: Hearing grossly intact, tympanic membranes pearly gray w/o cerumen imp  action  Cardiovascular: RRR, no murmurs, no gallops, no edema  Respiratory: No respiratory distress, no accessory muscle use, clear to [place]  cultation  Abdomen: soft, nondistended, nontender, normal BS  Extremities: full joint motion, no deformities, crepitus in both knees on e  xtension. mild non-specific right elbow and shoulder tenderness diffusely,   intact ROM in shoulder/elbows/knees bilat  Psych: Oriented to all spheres, affect and mood appropriate  .  [name]  Assessment /T/ Plan:   Patient w/ hx of CPAP, migraine, obesity presents for annual physical exam.   .  #Migraines/tension HA  Notes worsening since unable to use CPAP. HA usually resolve with water in  the morning but she has been using sumatriptan.  - Refill sent to pharmacy with 3 refills (8 tabs per month max)  - Instructed patient to call clinic if she is having HA with higher frequen  cy as she may require a preventive medication  .  #OSA  Pt states that [place] and [place] get   a new one.   - Pt plans to get CPAP back in summer  .  #Obesity  #Hepatic  steatosis  Patient states she has been going back and forth with loosing and gaining w  eight.   - Open to going back to weight and wellness, will place referral again. She   has been lost to follow up with them  - We discussed walking more and exercising more. She does have difficulty w  ith her right knee when walking up stairs.  - CMP ordered   - HbA1c and lipid panel ordered  - TSH ordered  .  #RIght shouler, knee and back pain  Patient has been having this for about 2 years. Could be due to obesity vs.   osteoarthritis vs. RA. She takes tylenol for the pain alternates with alev  e or ibuprofen.   - PT referral letter given  - Will discuss progress and if imaging is needed at f/u in 6 weeks   .  #RHM  - Pt s/p TAH+BS in 2018, no longer requires pap smears  - Breast CA screening w/ breast ultrasound scheduled 03/03/2020  - COVID vaccinated x 3  - TDAP done in 2017  - Flu shot administered 02/18/20  .  .  .  * Resident Signature (.sign): ......................................Marland KitchenPrasan  ti Ravipati MD -[facility] 4A-  February 18, 2020 3:26 PM  .  .  .  ORDERS:  LYTES [SODIUM] [CPT-82495]  BUN [BUN] [CPT-84520]  Creatinine (CR) [CREATININE] [CPT-82565]  SGOT (AST) [SGOT (AST)] [CPT-84450]  SGPT (ALT) [SGPT (ALT)] [CPT-84460]  Alkaline Phosphatase (ALK) [ALK PHOS] [CPT-84075]  Bilirubin, Total [BILI TOTAL] [CPT-82247]  CBC/DIFF (with Plt) [WBC] [CPT-85025]  Hemoglobin A1C (Glycohemoglobin) [HGBA1C] [CPT-83036]  Lipid Profile-Chol, HDL, Trig, LDL(calc) [CHOLESTEROL] [CPT-80061]  TSH Reflex [TSH] [CPT-84439]  [place] [facility] [Ref-Nutrition]  RTC (Return to Clinic) [RTC-000]  Flu (46962) [[id number]]  Flu/Influenza Admin [phone]) [[id number]]  Est Preventive 40-64yo [CPT-99396]  .  [name]  **Preceptor Note**  Resident: Ravipati (Prasanti)  Problem Physical Exam  .  .  Subjective   Patient is a 50 Years Old Female who presents to clinic OSA on CPAP, obesit  y, migraines.  .  Left her CPAP in [place] and won't be able to  get another one till [place]  s summer so isn't sleeping well. She is snoring and has headaches. Takes su  matriptan for HA (Neuro had prescribed to her).   .  Obesity: Feels discouraged. Has sugar cravings. Used to see [facility]. Walks for exercise but this makes her R-sided joint pain act up.    I discussed the patient with Dr. Lillia Carmel [facility]) in a routine preceptin  g encounter.  I also personally interviewed and examined the patient.  I ag  ree with the patient's diagnosis and management.   .  Discussed Services Due: Yes  .  Objective   .  Assessment   37F with obesity, OSA, migraines.  .  Plan   # Obesity: Counseling regarding diet and exercise provided, referred to [facility] for further f/u.  # Migraines: Sumatriptan prn  .  # RHM:  -s/p COVID vaccine and booster  -s/p Tdap  -flu shot today  -s/p TAH  .  F/u in 1-2 months for joint pains, HA, obesity  See resident note for details, Follow-up per resident note  .  Seen On Team (Floor):  4A  .  .  .  Services Due: PATPORTALPIN, PHQ-9 SCORE, PHQ-9 SCORE.    .  Items recorded during this note:  Added new observation of ALLERGY REV: Done (02/18/2020 13:54)  Added new observation of FLU [place]AX VIS: VIS handout, Influenza 08-21-2019 (02/0  03/2020 13:54)  Added new observation of PAP SMEAR: [place],TAH (02/18/2020 13:54)  Added new observation of ATTENDING MD: I saw patient (02/18/2020 13:54)  Added new observation of PTCARETEAM: 4A (02/18/2020 13:54)  Added new observation of RESIDENT MD: Ravipati (Prasanti) (02/18/2020 13:54)  Added new observation of SH REVIEWED: reviewed (02/18/2020 13:54)  Added new observation of ROS: Denies dizziness, lightheadedness, SOB, chest   pain, sore throat, congestion, cough, hematuria, blood in stool, abdominal   pain, N/V/D/C, fevers or chills.  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  Exercise: not currently exercising, used to swim  Sexual history: not currently sexually active, has been in the past with me  n.  No history of STIs.  Last Pap smear April 2017, normal.  No history of   abnormal Pap smears.  Last mammogram 02/2019, normal except for fibrocystic   breast disease, breast US with possible abnormal right breast cyst. repeat   u/s 03/03/2020  Vaccines: TDAP 2017. COVID vaccine x 3 01/2019 (02/18/2020 13:54)  Added new observation of FH REVIEWED: reviewed (02/18/2020 13:54)  Added new observation of PMH REVIEWED: reviewed (02/18/2020 13:54)  Added new observation of COMPPHYSICAL: Constitutional: Alert, no acute dist  ress, well hydrated, well developed  Skin: Normal turgor, normal color, no rashes, no suspicious lesions, no unu  sual bruising  Head: atraumatic, normocephalic  Eyes: EOM intact, no injection, no icterus  Ear: Hearing grossly intact, tympanic membranes pearly gray w/o cerumen imp  action  Cardiovascular: RRR, no murmurs, no gallops, no edema  Respiratory: No respiratory distress, no accessory muscle use, clear to [place]  cultation  Abdomen: soft, nondistended, nontender, normal BS  Extremities: full joint motion, no deformities, crepitus in both knees on e  xtension. 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   .  Traveled and left her CPAP in [place] visited sister and mom so she ha  sn't been able to use it. So she does have a headache at night that usually   improves in the morning once drinking water.   .  She is taking sumatriptan for her headaches which helps. She needs a refill.  .  She expresses a lot of frustration with her weight and diet. She has lost 1  5 pounds but then regained and has been unable to keep weight off. She endo  rses constant sugar craving. She has beeen unable to continue swimming clas  ses since the [employer] had a problem with their pool. Due to her sugar cravings   she is unable to focus or do work unless she eats sugar.  .  She states that right shoulder pain, knee pain and back pain have been prev  enting her from exercising. She notes pain throughout the day and sometimes   pain is so bad she cannot move. She takes tylenol and alternates with alev  e for the pain which helps.  .  3 weeks ago she had sore throat and cough with fatigue, COVID was negative   but she got tested 2 weeks after sx onset. She feels better now.  (02/17/18  22 13:54)  Added new observation of TIMEPTSEEN: 1:59 PM (02/18/2020 13:54)  Added new observation of PULSE RATE: 86 /min (02/18/2020 13:54)  Added new observation of TEMPERATURE: 97.5 deg F (02/18/2020  13:54)  Added new observation of BP DIASTOLIC: 79 mmHg (02/18/2020 25:42)  Added new observation of BP SYSTOLIC: 119 mmHg (02/18/2020 13:54)  Added new observation of BP DIA #2: 79 mm Hg (02/18/2020 13:54)  Added new observation of BP SYS #2: 119 mm Hg (02/18/2020 13:54)  Added new observation of HRAAMBULASST: NO  (02/18/2020 13:54)  Added new observation of BMI: 43.31 kg/m2 (02/18/2020 13:54)  Added new observation of WEIGHT: 251.4 lb (02/18/2020 13:54)  Added new observation of PRSAPCRISK:   SDOH, Food Insecurity: Sometimes true (04/09/2019 3:28:51 PM)    (02/18/2020 13:54)  Added new observation of [place] PREVSUM: February 18, 2020 1:57 PM  Screening [place]: Johny Drilling Marylene Land)  .  ---------- ---------- ----------     (02/18/2020 13:54)  Added new observation of PRECOMMENT: February 18, 2020 1:57 PM  Screening [place]: Johny Drilling Marylene Land)  .  ---------- ---------- ----------     (02/18/2020 13:54)  Added new observation of PNACTION2: February 18, 2020 1:57 PM  (02/18/2020   13:54)  Added new observation of C19VACCINE: Yes  (02/18/2020 13:54)  Added new observation of COV19BST: Yes  (02/18/2020 13:54)  Added new observation of NCOVIDINSTQ: No  (02/18/2020 13:54)  Added new observation of EXP_ASSOCSYM: No  (02/18/2020 13:54)  Added new observation of SMOK STATUS: never smoker  (02/18/2020 13:54)  Added new observation of HOI/100.7.1: NO  (02/18/2020 13:54)  Added new observation of WALKER: NO  (02/18/2020 13:54)  Added new observation of CANE: NO  (02/18/2020 13:54)  Added new observation of  DIFFWALKING: NO  (02/18/2020 13:54)  Added new observation of FALLSLST6MO: Had no falls  (02/18/2020 13:54)  Added new observation of TRAVEL HX: No  (02/18/2020 13:54)  Added new observation of HEIGHT: 64 in (02/18/2020 13:54)  Added new observation of MEDICAT LIST: PRINTED by [place] for patient  (02/17/18  22 13:54)  Added new observation of PRE VIS PREP: Johny Drilling Marylene Land)  (02/18/2020 13:54)  Added new observation of APPT SCHED: Office  (02/18/2020  13:54)  Added new observation of TIME IN EXRM: 1:55 PM  (02/18/2020 13:54)  Added new observation of BIRTH MONTH: 07  (02/18/2020 13:54)  Added new observation of CRCLINPROT: PATPORTALPIN, PAP SMEAR, PHQ-9 SCORE,   PHQ-9 SCORE.    (02/18/2020 13:54)  Added new observation of PCPVISIT: Prasanti Ravipati MD -[facility] 4A-  (02/03/202  2 13:54)  .  Patient Care Plan  .  .  .  Vaccines Administered/Entered:  Vaccination Group: Influenza  Historical Source: Other Provider  Series: 3  Vaccination: Fluarix Quadrivalent Intramuscular Suspension Prefilled [employer]  e 0.5 ML  Mfr / Lot# / Exp.Date: GlaxoSmithKline  Amt. Given / Route / Site: 0.5  / IM / Left Deltoid  NDC / CVX: [id number] / 150  Administered Date: 02/18/2020  [facility] Eligibility: Not recorded  VIS Date: 08/21/2019  Comments:   Entered by: Ravipati MD -[facility] 4A-, Prasanti   .  Immunization Worksheet 2019 (rev 04/22/2019)   .  Influenza VIS VIS handout, Influenza 08-21-2019  .  .  .  .  .  .  .  .  .  .  .  .  Follow-up With:   .  .  [name]  Electronically Signed by [name] MD on 02/18/2020 at 3:30 PM  ________________________________________________________________________

## 2020-03-03 ENCOUNTER — Ambulatory Visit: Admit: 2020-03-03 | Payer: Commercial Managed Care - HMO

## 2020-03-03 ENCOUNTER — Ambulatory Visit: Admitting: Internal Medicine

## 2020-03-03 ENCOUNTER — Ambulatory Visit

## 2020-03-03 LAB — HX HEM-ROUTINE
HX BASO #: 0 10*3/uL (ref 0.0–0.2)
HX BASO: 1 %
HX EOSIN #: 0.1 10*3/uL (ref 0.0–0.5)
HX EOSIN: 2 %
HX HCT: 41.3 % (ref 32.0–45.0)
HX HGB: 13.6 g/dL (ref 11.0–15.0)
HX IMMATURE GRANULOCYTE#: 0 10*3/uL (ref 0.0–0.1)
HX IMMATURE GRANULOCYTE: 0 %
HX LYMPH #: 2.2 10*3/uL (ref 1.0–4.0)
HX LYMPH: 42 %
HX MCH: 30 pg (ref 26.0–34.0)
HX MCHC: 32.9 g/dL (ref 32.0–36.0)
HX MCV: 91 fL (ref 80.0–98.0)
HX MONO #: 0.4 10*3/uL (ref 0.2–0.8)
HX MONO: 7 %
HX MPV: 10.4 fL (ref 9.1–11.7)
HX NEUT #: 2.6 10*3/uL (ref 1.5–7.5)
HX NRBC #: 0 10*3/uL
HX NUCLEATED RBC: 0 %
HX PLT: 319 10*3/uL (ref 150–400)
HX RBC BLOOD COUNT: 4.54 M/uL (ref 3.70–5.00)
HX RDW: 13.2 % (ref 11.5–14.5)
HX SEG NEUT: 50 %
HX WBC: 5.3 10*3/uL (ref 4.0–11.0)

## 2020-03-03 LAB — HX CHEM-PANELS
HX 2021 EGFRCR: 101 mL/min/{1.73_m2} (ref 60–999)
HX ANION GAP: 10 (ref 3–14)
HX BLOOD UREA NITROGEN: 5 mg/dL — ABNORMAL LOW (ref 6–24)
HX CHLORIDE (CL): 106 meq/L (ref 98–110)
HX CO2: 26 meq/L (ref 20–30)
HX CREATININE (CR): 0.73 mg/dL (ref 0.57–1.30)
HX GFR, AFRICAN AMERICAN: 112 mL/min/{1.73_m2}
HX GFR, NON-AFRICAN AMERICAN: 97 mL/min/{1.73_m2}
HX POTASSIUM (K): 3.6 meq/L (ref 3.6–5.1)
HX SODIUM (NA): 142 meq/L (ref 135–145)

## 2020-03-03 LAB — HX CHEM-LFT
HX ALANINE AMINOTRANSFERASE (ALT/SGPT): 21 IU/L (ref 0–54)
HX ALKALINE PHOSPHATASE (ALK): 81 IU/L (ref 40–130)
HX ASPARTATE AMINOTRANFERASE (AST/SGOT): 26 IU/L (ref 10–42)
HX BILIRUBIN, TOTAL: 0.6 mg/dL (ref 0.2–1.1)

## 2020-03-03 LAB — HX CHEM-LIPIDS
HX CHOL-HDL RATIO: 4.3
HX CHOLESTEROL: 243 mg/dL — ABNORMAL HIGH (ref 110–199)
HX HIGH DENSITY LIPOPROTEIN CHOL (HDL): 57 mg/dL (ref 35–75)
HX HOURS FAST: 0 h
HX LDL: 162 mg/dL — ABNORMAL HIGH (ref 0–129)
HX TRIGLYCERIDES: 120 mg/dL (ref 40–250)

## 2020-03-03 LAB — HX CHOLESTEROL
HX CHOLESTEROL: 243 mg/dL — ABNORMAL HIGH (ref 110–199)
HX HIGH DENSITY LIPOPROTEIN CHOL (HDL): 57 mg/dL (ref 35–75)
HX LDL: 162 mg/dL — ABNORMAL HIGH (ref 0–129)
HX TRIGLYCERIDES: 120 mg/dL (ref 40–250)

## 2020-03-03 LAB — HX CHEM-METABOLIC
HX HEMOGLOBIN A1C: 5 %
HX THYROID STIMULATING HORMONE (TSH): 1.85 u[IU]/mL (ref 0.35–4.94)

## 2020-03-03 LAB — HX DIABETES: HX HEMOGLOBIN A1C: 5 %

## 2020-03-08 ENCOUNTER — Ambulatory Visit

## 2020-03-08 NOTE — Progress Notes (Signed)
 [facility]  [address]  [place] [address]  Main: [phone]  Fax: [phone]  Patient Portal: [email or web address]      March 18, 2020            MRN: [id number]            DOB: 03-26-1970   [name] [name]   [address]   [place] [address]      The results of your recent tests are as follows:      Cholesterol Tests:      Total Cholesterol   243  Normal 110-199    03/03/2020    HDL (good cholesterol)  57  Normal >40     03/03/2020    Triglyceride    120  Normal 40-250    03/03/2020    LDL (bad cholesterol)   162  Normal <160    03/03/2020     Diabetes Tests:      HgbA1c:    5.0  Normal 4.3 - 5.6    03/03/2020     Blood Count Tests:      Hematocrit:    41.3  Normal 32-45 women, 37-47 men  03/03/2020    Hemoglobin:    13.6  Normal 13.5-16 female, 65-15 female 03/03/2020    White Blood Cells:   5.3  Normal 4 - 11    03/03/2020    Platelets:    319  Normal 150-400    03/03/2020    MCV:     91.0  Normal 80-96    03/03/2020     Liver Tests:      ALT:     21  Normal 0 - 54    03/03/2020    AST:     26  Normal 10 - 42    03/03/2020    Total Bili:    0.6  Normal 0.2 - 1.1    03/03/2020    Alk Phos:    81  Normal 40 - 130    03/03/2020     Endocrine Tests:      TSH:     1.85  Normal 0.35-4.94   03/03/2020     Kidney Tests:      BUN:     5  Normal 6 - 24    03/03/2020    Creatinine:    0.73  Normal 0.57 - 1.30   03/03/2020    GFR (Non African-American):  97  Normal >= 60    03/03/2020    GFR (African-American):  112  Normal >= 60    03/03/2020     Electrolyte Tests:      Sodium:    142 MEQ/L  Normal 135-145   03/03/2020    Potassium:    3.6 MEQ/L  Normal 3.6-5.1   03/03/2020    Chloride:    106 MEQ/L  Normal 98-110   03/03/2020    Bicarbonate:    26 MEQ/L  Normal 20 - 30   03/03/2020    Anion Gap:    10   Normal 5 - 18    03/03/2020     You cholesterol is elevated and I recommend lifestyle modications detailed below.    Heart attacks and strokes are  usually caused by atherosclerotic cardiovascular disease (ASCVD). ASCVD develops because of a buildup of sticky cholesterol-rich plaque. Over time, this plaque can harden and narrow arteries.  A heart healthy lifestyle can   lower your  risk of having ASCVD and I suggest the following:  -Eat a diet rich in vegetables, fruits, and whole grains; this includes low-fat dairy products, poultry, fish, legumes and nuts. Limit the intake of sweets, sugar-sweetened beverages and red meats.  -Get regular exercise. In general, adults should get aerobic physical activity 3-4 times a week with each session lasting an average of 40 minutes. Moderate (brisk walking or jogging) to vigorous (running or biking) physical activity is recommended to reduce cholesterol levels.           Sincerely,       Lovena Neighbours MD on behalf of Prasanti Ravipati MD -[facility] 4A-  [facility]    Results Provided by: Mail           Created By [name] on 03/08/2020 at 12:45 PM    Electronically Signed By Lovena Neighbours MD -[facility] 4B- on 03/18/2020 at 04:45 PM

## 2020-03-10 ENCOUNTER — Ambulatory Visit: Admit: 2020-03-10 | Payer: Commercial Managed Care - HMO

## 2020-03-10 ENCOUNTER — Ambulatory Visit

## 2020-03-10 ENCOUNTER — Ambulatory Visit: Admitting: Registered"

## 2020-03-10 NOTE — Progress Notes (Signed)
 .  Progress Notes  .  Patient: [name] [name]  Provider: Lyda Kalata  DOB:[birth year] Age: 50 Y Sex: Female  .  PCP: [name]  MD  Date: 03/10/2020  .  --------------------------------------------------------------------------------  .  REASON FOR APPOINTMENT  .  1. DOX NP JSW (MARG) GC  .  HISTORY OF PRESENT ILLNESS  .  GENERAL:   Pt is here for follow-up as part of the Jumpstart to Wellness  program._________WT XB:MWUXLKG wt: 265 lbWhy wt loss now:  Improved health, better mobility, decreased painOnset of wt gain:  After hysterectomy a few years ago and again since working from  home during pandemicWt loss attempts: Reduced portions, avoiding  sugar_________DIET RECALL:Wakes at: 8 or 9amB: Almond milk-1 cup,  miso soup OR skipsSn at 10am: Sometimes "grabs something"L at  12pm: Snacks or rice w/ goat/fishSn: Fruits more recently-mango  and pineapple OR nuts and cranberriesD: Soups-sometimes mixes in  kale; fish-salmon/tilapia or goat w/ Rice/pasta Sn at midnight:  Leftovers from dinner-large portion or cereal Fluids:  Water-inadequate intake, no juice-used to drink a lot, soda  (rarely)Food preferences: Rice-loves, indigenous foods, loves  sugar_________HABITS:Missed meals: Y, Snacks more often than  eating mealsEating speed: Eating out: RarelyETOH: drinks/weekFood  logging / logs reviewed: NPatterns: "Not a morning eater,"  replacing carbohydrates/sugar with high fiber foods-psyllium and  oatmeal or fruits_________GI ISSUES: ; Food  allergies/intolerance: None reported_________EXERCISE: None,  limited movement because of knee and side pain. Looking into  support [facility]_________COMMENTS:Previous  RD visit: Pt on call today for f/u JSW visit. Pt struggling since  last visit stating "things went sideways" and has gained 2 lb.  Continues to struggle with late night eating often choosing large  portions of cereal or leftovers from dinner when she cannot sleep  to self-soothe.  Received CPAP machine but still adjusting to it.  Also noticed she eats more out of boredom since working from home  and went to [employer] with others recently when normally  doesn't eat fast food. Tried food logging for a few days, but  then stopped. Continues to believe she is not a disciplined  person. However, this past week pt has doubled efforts and seen  improvement with diet and exercise. Purchased wet suit for  exercise, because was told it improves activity by "holding  everything together" and has been working out more in it. Also  trying to choose healthier choices, increase veggie intake, and  reduce sugar and carb intake. Looking for support with and  suggestions for substitutions she can make at meals and  tips/suggestions related to her current diet. Not as interested  in trying new foods or meals normally doesn't eat.03/10/20:  "Ahday" Many common challenges including strong sugar cravings,  eats/drinks sweets/juice, is choosing more water/diet flavored  drinks because does not like plain water. Exercise: heard from  neuroscience to fidget more to burn claories, also walks around  block. Works 8h/day as busy Insurance underwriter. Can't seem to stick  to changes and stay motivated, small steps. Motivated by health  to lose weight- doesn't care so much about her body shape- severe  back pain, wants to get breast reduction surgery- be more mobile  and exercise, swim. Looking for exercise she actually likes to do  - try walk fitness. Thinks sugar intake is a problem. Waits until  12pm to eat and must have something sugary to satify. Picked at  rice today. She cooks but sometimes she also  bakes sweet goods.  Is still checking labels. Nibbles alot on peanuts- uses cap to  measure and eats 2 or 3 caps, yogurt, guacamole, some veggies but  barely eats, Worse case scenario in pantry cereal. Lots of sweet  fruits. Sitting for 8hours eating. DOesn't start eating until 6,  then 10pm eating, sometimes late nigth  eating. Dinner: Rice,  sometimes bread, make a sandwich peckish yogurt with nuts.  Sweets- juice/fruits/doughnuts-pastries, cereals. Does put some  veggies in rice/beans/yams. [place] background. Suggested a  simplified eating plan with shakes/bars. SOme of problem is  discipline/regulation. Cpap machine gone which is challenging,  Major challenges: Snacking, sleeplessness, eating later at night.  RD and pt discuss how to Standardize eating habits. Try for 1  week- time table. Send various meal-planning templates.  .  CURRENT MEDICATIONS  .  Taking Multivitamin Capsule Oral  Taking Sertraline HCl 25 MG Tablet 1 tablet Orally Once a dayx 4  days, then 2 tablets twice daily if needed  Taking SUMAtriptan Succinate 50 MG Tablet 1 tablet at least 2  hours between doses as needed Orally Twice a day  Not-Taking/PRN Centrum - Tablet Orally , Notes: PRN  Not-Taking/PRN Excedrin Migraine  Not-Taking/PRN iron 1 tab Oral , Notes: PRN  Not-Taking/PRN Lexapro 5 MG Tablet 1 tablet Orally Once a day  .  VITAL SIGNS  .  Ht-in 64.5, Wt-lbs 250, BMI 42.25, BSA 2.27, Ht-cm 165.1, Wt-kg  113.4, Wt Change 4.6 lb.  .  ASSESSMENTS  .  Morbid obesity due to excess calories - E66.01 (Primary)  .  Dietary counseling and surveillance - Z71.3  .  BMI 40.0-44.9, adult - Z68.41  .  Pt is here for the Jumpstart to Wellness Program/MWL  program_________WT CHANGES: 1ST VISIT, N/A_________NUTR. NEEDS:  REE= Calories/day: <=1600 kcals ([place] Jeor)Carbohydrates:  <165g, <=50g/meal, <=20g/snackProtein/day: grams (0.8g/kg actual  body weight or 1.5-2g/kg IBW for BMI >50)Fluids: 64  oz./day_________NUTR. MARKERS:[place]ro[place], Carbohydrates, Protein,  Fluid, otherAssessment: Excess kcals from mindles snacking  including on "healthier" foods like nuts and fruits, juices,  sweet baked goods. _________NUTR DX:1.Overweight / obesity  secondary to excessive energy intake, environmental factors as  evidenced by pt interview, BMI  >  30.Barriers/Challenges/Readiness: Lower health/nutrition  literacy, feels overhwhelmed by changes with busy schedule, low  confidence in ability to [facility] to changes.  Support/Strengths: Sister, visits. _________EDUCATION  TOPICS/DISCUSSION:Nutrition Targets for weight-loss: Calories  (deficit), Carbohydrates (controlled), energy balance for  metabolism. Nutrition for health: Fiber foods- vegetables  (starchy & nonstarchy), whoole grains, fruits, lean proteins,  healthy fats. Meals: Meal planning, Meal timing, balanced meals,  portion control methods, Plate Method, food logging/self  monitoring, Lifestyle: Problem solving, stimulus control,healthy  habits, goal setting, self monitoring. Exercise targets.. Pt is  here for the Jumpstart to Wellness Program._________WT CHANGES:  Up 2 lb_________NUTR. NEEDS:Protein/day: 90 grams (0.8g/kg actual  body weight)Fluids: 64 oz./day_________NUTR. GOALS:Exceeding  calorie needs: YMeeting protein needs: YMeeting fluid needs:  NAssessment: Pt without structure or consistency with meals and  snacks, craves sugar, struggles with late night eating for  comfort_________NUTR DX:1.Obesity secondary to excessive energy  intake, environmental factors as evidenced by pt interview, BMI >  30._________EDUCATION:-Validated pt's concerns about wt gain,  emphasized importance of support from others , and educated on  idea of eating smarter not harder or less-Suggested MWL program  for team's support-Suggested different strategies to help  decrease late night eating and self-soothe without  food-Emphasized value of food logging for self-awareness,  mindfulness, and accountability-Educated on nutrient dense foods  and filling up on more of them in place of foods like  cereal-Discussed importance of avoiding negative selftalk for  long term successSMART Goals-1. Food logging-Start with one meal  daily2. Continue with exercise in wetsuit but remaining cautious  of signs of heat distress.  Avoid when gets hotter outside3. When  not able to sleep try:- hot herbal tea such as ones that include  chamomile, ashwagandha, or lavendar.-brushing teeth again -Taking  warm/hot shower or bath-Having low-sodium broth or broth-based  veggie soup4. Avoid having cereal, best to remove from house for  now.  .  TREATMENT  .  Dietary counseling and surveillance  Notes: 45 minute JSW reset visit via telehealth with RD. Pt with  many diet/exercise areas to work on including excess snacking,  little meal planning, and late night eating/large portions of  starchy food. Pt and RD discuss various meal-planning options to  help pt stick to a more simplified diet and regulate her eating  pattern. RD and pt also discuss setting small exercise goals such  as 5-10 minute workouts with home video such as [place].  RD to send various resources for pt to look at and decide which  makes sense for her. F/u March 24th- pt should also see BHT/Endo.  [name]  Resources: Meal Matrix, Exchange template, Meal replacement  template, snack ideas, resource list, [place].  .  Nutrition & Lifestyle goals:  .  1. Diet: Create 250-750 kcal deficit from food swaps, portion  control, reduced servings. Use food logging to assist.  .  2. Lifestyle: Work on more regular eating patterns, evenly spaced  meals, meal planning, self-monitoring.  .  3. Exercise: Aim to meet national standards >=30 minutes/day  light-moderate physical activity and >=75 minutes/wk higher  intensity exercise.  .  .  Others  Notes:  Strategies/Methods:  Met with patient for 45 min of one-on-one, individual nutrition  therapy. Keep daily food and exercise logs. Aim to achieve  designated calorie and protein recommendations in order to  promote estimated weight loss of 1-2 lbs per week. Eat every 3-4  hours starting w/ breakfast. Include a good source of protein and  fiber with all meals and snacks to help with hunger control.  Create balanced meals using the Plate Method  as a guide. Identify  future weight loss goals after completion of 3 RD visits.  [name]  PROCEDURE CODES  .  27253 Initial Assessment New Patient (each 15 minutes), Units:  3.00 , Modifiers: GT  .  FOLLOW UP  .  4 Weeks  .  Electronically signed by Lyda Kalata on 03/12/2020  at 12:36 PM EST  .  Document electronically signed by Lyda Kalata    .

## 2020-03-11 ENCOUNTER — Ambulatory Visit: Admit: 2020-03-11 | Payer: Commercial Managed Care - HMO

## 2020-03-11 ENCOUNTER — Ambulatory Visit: Admitting: Internal Medicine

## 2020-03-11 DIAGNOSIS — N63 Unspecified lump in unspecified breast: Principal | ICD-10-CM

## 2020-03-24 ENCOUNTER — Ambulatory Visit

## 2020-03-24 NOTE — Telephone Encounter (Signed)
 Call Details:   Patient PCP = Prasanti Ravipati MD -[facility] 4A-  [name] [name] (Patient) called on March 24, 2020 2:03 PM.  Message taken by: Payton Doughty  Primary call-back number: [phone]    Secondary call-back number: () -    Call Reason(s): Message/Call-Back      ** MESSAGE / CALL-BACK.  Regarding: Pt is requesting to have her mammogram rescheduled for next friday 03/18    ---------- ---------- ---------- ---------- ---------- ----------       RESPONSE/ORDERS:    Spoke with pt and rescheduled appt to 3/14. Thank yoU!  ......................................Marland KitchenMertha Finders  March 24, 2020 2:16 PM               ORDERS/PROBS/MEDS/ALL     Problems:   ANNUAL PHYSICAL EXAMINATION (ICD-V70.0) (ICD10-Z00.00)  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) (ICD10-G47.33)      FATIGUE (ICD-780.79) (ICD10-R53.83)      SNORING (ICD-786.09) (ICD10-R06.83)  DEPRESSION (ICD-311) (ICD10-F32.9)  PMDD - QUESTION OF (ICD-625.4) (ICD10-N94.3)  CHANGES IN VISION (ICD-368.9) (ICD10-H53.9)  MACROMASTIA (ICD-611.1) (ICD10-N62)  FATTY LIVER (ICD-571.8) (ICD10-K76.0)  MIGRAINE (ICD-346.90) (ICD10-G43.909)      MUSCLE SPASMS OF NECK (ICD-728.85) (ICD10-M62.838)      NECK PAIN, CHRONIC (ICD-723.1) (ICD10-M54.2)      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)      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)  BACK PAIN (ICD-724.5) (ICD10-M54.9)  KNEE PAIN, RIGHT (ICD-719.46) (ICD10-M25.561)  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)      MAMMOGRAM, ABNORMAL, BILATERAL (ICD-793.80) (ICD10-R92.8)  S/P TOTAL ABDOMINAL HYSTERECTOMY (ICD-V88.01) (ICD10-Z90.710)      UTERINE FIBROIDS (ICD-218.9) (ICD10-D25.9)      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 if headache persists after 2 hours, may take 1 more tablet            Created By Payton Doughty on 03/24/2020 at 02:02 PM    Electronically Signed By [name] on 03/24/2020 at 02:16 PM

## 2020-03-28 ENCOUNTER — Ambulatory Visit: Admit: 2020-03-28 | Payer: Commercial Managed Care - HMO

## 2020-03-28 ENCOUNTER — Ambulatory Visit: Admitting: Internal Medicine

## 2020-03-28 DIAGNOSIS — Z1231 Encounter for screening mammogram for malignant neoplasm of breast: Principal | ICD-10-CM

## 2020-04-04 ENCOUNTER — Ambulatory Visit: Admit: 2020-04-04 | Payer: Commercial Managed Care - HMO

## 2020-04-04 ENCOUNTER — Ambulatory Visit: Admitting: Clinical

## 2020-04-04 ENCOUNTER — Ambulatory Visit

## 2020-04-04 NOTE — Progress Notes (Signed)
 .  Progress Notes  .  Patient: [name] [name]  Provider: Margret Chance, AMY    .  DOB: 01-12-1971 Age: 50 Y Sex: Female  .  PCP: [name] Shy  MD  Date: 04/04/2020  .  --------------------------------------------------------------------------------  .  REASON FOR APPOINTMENT  .  1. DOX NP MWL GC  .  HISTORY OF PRESENT ILLNESS  .  GENERAL:   This evaluation was conducted via telehealth due to Covid 19  federal pandemic using both video and Facilities manager. Patient  at home, provider at home, no other participants. Patient and  provider are located in [place]. Patient [name] [name] (says  to call her "[name] [name]") is a 50yr old female who presents to the  [facility] for Initial Behavioral Health  Evaluation for [facility]. Patient is fluent in [place], no interpreter  necessary.Patient spoke openly about the pandemic and it's effect  on her weight and emotional health. Patient is pursuing support  as she re-engages in making improved nutrition and lifestyle  choices and work toward weight loss via MWL track.  .  CURRENT MEDICATIONS  .  Taking Multivitamin Capsule Oral  Taking Sertraline HCl 25 MG Tablet 1 tablet Orally Once a dayx 4  days, then 2 tablets twice daily if needed  Taking SUMAtriptan Succinate 50 MG Tablet 1 tablet at least 2  hours between doses as needed Orally Twice a day  Not-Taking/PRN Centrum - Tablet Orally , Notes: PRN  Not-Taking/PRN Excedrin Migraine  Not-Taking/PRN iron 1 tab Oral , Notes: PRN  Not-Taking/PRN Lexapro 5 MG Tablet 1 tablet Orally Once a day  .  ASSESSMENTS  .  Depression - F32.9 (Primary)  .  Morbid obesity due to excess calories - E66.01  .  Patient reports she grew up in [place], her parents moved to [place]  and then she followed to to her Masters degree in [employer]  in [place] (she added that she did not finish), and moved to  [place] in 2017. She said her mom and dad have since passed away,  mom most recently in 2020. Patient reports "I'm single so I'm  not  bound to stay here so I don't know" about future plans. Patient  says she has some very good friends from childhood [place], some  in [place] and some here. She says "I have a lot going on"- works  remotely as a Retail buyer for [employer] however recently  had to do some in-person visits and says workload is high.  Patient remains still very cautious of covid and she limits  grocery shopping, going to laundry, and has not yet resumed  socializinge- says her friends remain very cautious too. Patient  shared with humor that her PCP has encouraged her to get a  therapist. BH spoke with her about this, patient says living  alone during the 2 year pandemic, working remote all that time,  no family close by (in [place]) and feeling isolated has  caused her "to suffer the toll of covid... caused anxiety". We  discussed the pandemic and need for self-care. She said she hopes  to travel to [facility] for a week in April.Eating pattern/habits:  eats first meal 1-2pm and then next meal 7-8pm. I don't eat a lot  of process foods or bad foods but my problem is snacking which is  excess calories- sometimes nuts and grapes. I honestly try to  educate myself- feels her sleep cycle is the problemAnxiety and  Depression: Per patient- a  little of both, I was feeling blue,  couldn't concentrate or sleep which wasn't like me- my  brother-in-law is an internist, he feels I should talk to my PCP  and he encourages me...it's gone on for a few months- affects my  daily schedule, I find I can't complete tasks even the simple  things that bring me joy. BH encouraged patient to follow through  suggestion to speak with PCP and we talked about the value of  small steps and goals and to avoid 'all or nothing'  thinkingSupport system: several good friends Behavioral health  support/therapy: plans to speak with PCP about depression and  consider taking Rx; will consider seeking ongoing  therapistExercise: I have to say I'm really  really bad- I'm not  really doing any, maybe now that it's getting nicer out I'll try  to get outside to walk a few blocks. I have it on calendar to get  outside- again talked about letting go of the 'all or nothing'  thinking and small steps. Work/school schedule: 10am-6pm work  day; says she used to get up at [employer] and in bed by 10pm, however  "now I'm lucky to be asleep by 3am", blames covid for getting off  scheduleSleep pattern/habits: insomnia- last night went to bed at  8:30pm and awake since 12:30am, started falling asleep at 7am but  woke up for this appt. Finds herself eating at night. Said she  has tried various things, will speak with PCP. She had Cpap  however lost it on a trip, says she did find it helpful. We spoke  about sleep stories/meditation/etc. Says she speaks with BIL a  lot about her sleep too, she's aware of the benefits of good  sleep/concerns of poor sleepUnable to assess these items during  this visit due to time constraint and emphasis on BH topics:  Caffeine; Soda/carbonated drinks; Juice/sweet drinks; Alcohol;  Smoking/nicotine; THC/cannabis; Illicit/other drugs.Patient feels  she needs to get her sleep issues resolved and resume regular  cycle as she understands that most of her poor eating habits  occur at night when tired and bored. We talked about food logs  with RD- patient shared "too ashamed to write down everything"  however added "I do intend to start the journaling". Patient said  she missed her recently scheduled PCP appt however will call to  re-schedule to discuss vartious topics including anxiety, stress  and sleep hygiene/lost cpap. Patient has scheduled f/up with RD  and agreeable to f/up [facility] visit, visit scheduled for 4/25 at 9am.  .  TREATMENT  .  Others  Notes:  1) Contact PCP to re-schedule missed visit- discuss both physical  and emotional health  2) Pursue outside [facility] support as needed  3) Work to improve sleep hygiene  4) Begin walking outdoors/exercise  5) Practice  mindful eating  6) Attend f/up RD visits  7) Attend f/up BH visits as needed.  [name]  PROCEDURE CODES  .  02890 PSYCH DIAGNOSTIC EVALUATION (8461)  .  Electronically signed by Harmon Pier , LICSW on  04/05/2020 at 02:25 PM EDT  .  Document electronically signed by Margret Chance, AMY    .

## 2020-04-05 ENCOUNTER — Ambulatory Visit: Admit: 2020-04-05 | Payer: Commercial Managed Care - HMO

## 2020-04-05 ENCOUNTER — Ambulatory Visit: Admitting: Internal Medicine

## 2020-04-05 NOTE — Progress Notes (Signed)
 General Medicine Tele-Medicine Visit  Patient presents during the COVID-19 pandemic / federally declared state of   public health emergency.  This visit is done remotely with myself and this patient.  Visit Type: Video Visit  Video Visit Vendor Doxim[employer]  Patient located in [place]: YES in [place]  Patient consent for audio or video type visit:  Yes  Chief Complaint:   breast cyst, depression  History of Present Illness:   Pt no-showed for her in-person appt with Dr. Lillia Carmel so made this appt.  .  # Breast cyst: Missed her appt on Friday for aspiration b/c got confused, w  as waiting in primary care office.   .  # OSA: [place]AP [place], hopes she can get it back eventu  ally. In April, she will try to get it back while traveling.  .  # Wanted to discuss her lab results from February  .  # Depression: Has had some trouble concentrating. Her brother in law admini  stered PHQ-9 scale for pt and per pt she scored a 7. She does feel a bit of   lack of motivation/interest, house getting more cluttered, lack of focus,   poor sleep. Now that winter is ending, she is more able to get herself out   of bed. Score today of 11 on PHQ-9. She is not interested in medication but   wants to see a therapist. She has taken depression medication before but m  ade her lethargic and she did not like it.  .  # Migraines: Uses sumatriptan and it helps  .  # Urinary urge incontinence: Getting this more, wants to do pelvic floor PT  . She already has tried [employer] [facility].   .  .  .  .  .  .  .  Family History: (reviewed)   Mom- died age 14 due to complications of diabetes, also had HTN  Dad- alive, healthy  Grandparents- UNK  Sister with uterine fibroids and grave's disease  Sister with asthma  No children  .  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  Exercise: not currently exercising, used to swim  Sexual history: not currently sexually active, has been in the past with me  n.  No history of STIs.  Last Pap smear April 2017, normal.  No history of   abnormal Pap smears.  Last mammogram 02/2019, normal except for fibrocystic   breast disease, breast US with possible abnormal right breast cyst. repeat   u/s 03/03/2020  Vaccines: TDAP 2017. COVID vaccine x 3 01/2019  .  .  .  Past Medical History (prior to today's visit):  ANNUAL PHYSICAL EXAMINATION (ICD-V70.0) (ICD10-Z00.00)  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) (ICD10-G47.33)      FATIGUE (ICD-780.79) (ICD10-R53.83)      SNORING (ICD-786.09) (FDV44-Z14.60)  DEPRESSION (ICD-311) (ICD10-F32.9)  PMDD - QUESTION OF (ICD-625.4) (ICD10-N94.3)  CHANGES IN VISION (ICD-368.9) (ICD10-H53.9)  MACROMASTIA (ICD-611.1) (ICD10-N62)  FATTY LIVER (ICD-571.8) (ICD10-K76.0)  MIGRAINE (ICD-346.90) (ICD10-G43.909)  MUSCLE SPASMS OF NECK (ICD-728.85) (ICD10-M62.838)      NECK PAIN, CHRONIC (ICD-723.1) (ICD10-M54.2)      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  .  .  Medications (prior to today's visit):  * MULTIVITAMINS ORAL CAPSULE Take 1 capsule by mouth once a day   sumatriptan succinate 50 mg tablet (sumatriptan succinate) Take 1 tablet by   mouth at onset of headache. if headache persists after 2 hours, may take 1   more tablet  .  No Changes to Medication List   Medications Reviewed:  Done  .  [name]  Allergies (prior to today's visit):  PENICILLIN (Critical)  * CHLOROQUINE (Moderate)  * INJECTAFER (Moderate)  .  .  .  .  Theodoro Kos from Patient:   .  .  .  .  .  Additional PE:   Alert, oriented, appropriately communicative  Speaking in full sentences without breathlessness  .  Assessment /T/ Plan:   50F with OSA on CPAP, migraine, obesity, depression.  .  # Depression, recurrent, moderate: PHQ-9 score of 11 (03/2020).  -Pt not interested in medication at this time, no SI  -Pt interested in therapy (she specifically wants CBT), she has used [email or web address] and hasn't been able to find a CBT-specialist who is taking n  ew patients, she will get on some waitlists  -Overall, pt feels on the upswing with the end of winter, really feels like   she's doing better, she will reach out if she needs additional support  -She will call back if she changes her mind regarding medication  .  # Breast cyst: Encouraged pt to reschedule aspiration appt ASAP  .  # Migraines/tension HA: Sumatriptan prn  .  # OSA: Will get CPAP machine back in April  .  # Obesity, fatty liver:  -Discussed healthy eating/exercise  -LFTs wnl 02/2020  .  # Urge urinary incontinence: Wants referral to pelvic floor PT, will send P  T referral letter to her and she will call to schedule PT appt  .  Pt will call back to schedule annual exam when Dr. Holli Humbles appts open   .  .  I spent 26 minutes with the patient counseling and treating the above condi  tions.  Location of Provider:  Home  Names of Participants for Visit: [name] [name], [name] [name]  .  Orders (this visit):  Est Level 4  (MDM or 30-39 min) [[id number]]  Patient Health Questionnaire (PHQ-9)  1.  Over the last 2 weeks how often have you been bothered by any of the fo  llowing problems?            Not at All (0) -  Several Days (1) -  More Than Half the Days (2)   -  Nearly Every Day (3)  a. Little interest or pleasure in doing things...[name]. 3  b. Feeling down, depressed, or hopeless...[name].  1  c. Trouble falling/staying asleep, sleeping too  much..... 3  d. Feeling tired or having little energy.Marland KitchenMarland KitchenMarland Kitchen. 1  e. Poor appetite or overeating.Marland KitchenMarland KitchenMarland Kitchen. 1  f. Feeling bad about yourself - or that you are a failure or have let yours  elf or your family down..... 0  g. Trouble concentrating on things such as reading the newspaper or watchin  g television...[name]. 1  h. Moving or speaking so slowly that other people could have noticed.   Or   the opposite - being so fidgety or restless that you have been moving aroun  d a lot more than usual...[name]. 1  i. Thoughts that you would be better off dead or of hurting yourself in som  e way..... 0  .  .  PHQ-9 Screening: 11 scored  .  [name]  Patient Care Plan  .  [name]  Immunization Worksheet 2019 (rev 02/25/2020)   .  .  .  .  .  .  .  .  .  .  .  .  .  Follow-up With:   .  .  [name]  Electronically Signed by [name] Maroon MD on 04/05/2020 at 9:55 AM  ________________________________________________________________________

## 2020-04-07 ENCOUNTER — Ambulatory Visit: Admit: 2020-04-07 | Payer: Commercial Managed Care - HMO

## 2020-04-07 ENCOUNTER — Ambulatory Visit: Admitting: Registered"

## 2020-04-07 NOTE — Progress Notes (Signed)
 .  Progress Notes  .  Patient: [name] [name]  Provider: Lyda Kalata  DOB:08/21/1970 Age: 50 Y Sex: Female  .  PCP: [name]  MD  Date: 04/07/2020  .  --------------------------------------------------------------------------------  .  REASON FOR APPOINTMENT  .  1. DOX FU MWL GC  .  HISTORY OF PRESENT ILLNESS  .  GENERAL:   Pt is here for follow-up as part of the MWL program._________WT  ZO:XWRUEAV wt: 265 lbWhy wt loss now: Improved health, better  mobility, decreased painOnset of wt gain: After hysterectomy a  few years ago and again since working from home during pandemicWt  loss attempts: Reduced portions, avoiding sugar_________DIET  RECALL:Wakes at: 8 or 9amB: Almond milk-1 cup, miso soup OR  skipsSn at 10am: Sometimes "grabs something"L at 12pm: Snacks or  rice w/ goat/fishSn: Fruits more recently-mango and pineapple OR  nuts and cranberriesD: Soups-sometimes mixes in kale;  fish-salmon/tilapia or goat w/ Rice/pasta Sn at midnight:  Leftovers from dinner-large portion or cereal Fluids:  Water-inadequate intake, no juice-used to drink a lot, soda  (rarely)Food preferences: Rice-loves, indigenous foods, loves  sugar_________HABITS:Missed meals: Y, Snacks more often than  eating mealsEating speed: Eating out: RarelyETOH: drinks/weekFood  logging / logs reviewed: NPatterns: "Not a morning eater,"  replacing carbohydrates/sugar with high fiber foods-psyllium and  oatmeal or fruits_________GI ISSUES: ; Food  allergies/intolerance: None reported_________EXERCISE: None,  limited movement because of knee and side pain. Looking [facility]  [facility]_________COMMENTS:04/07/20:  Tracking a few days on a nd off, pt reviews some food diary.  21st: Cassava flake pasta 1 cup with shrimp, yam and shrimp  dinner, blueberries, grapes 1 handful grapes, 1-2 [place] nuts.  22: 2 slices bread 1 container wholly Avocado single serve  sandwich, 1 can sweetened condensed milk mixed with water.  RD  calculates nutriton facts for can of condensed milk 1300 kcals,  220 grams carb/sugars. Traces back sugar craving/addiction to  she/family used to get sick from water (parasites), so she got  idea that soda/filtred juice is safer. Often would just drink  sweetened drinks and not eat. Teen years filled with  soda/sweetened drinks- developed sweet tooth does not like  drinking water. Did try flavored water but didn't like how much  it made her go to the bathroom. Ate 8 oranges. Sugar addiciton!  REally does not like sugar substitutes, then drinking more water,  then ends up craving sweets. What she will try: count how many  times she hsas sweteened drinks. Verifies she is not dirnking  soda, but dirnking lots of juices/naked juice, eating too many  nuts/cereal, and occasional baked goods/can of sweetened  condensed milk. Amazingly does not have elevated BG/A1c. Now that  weather is getting better she is walking more but still stuck  behind desk for 10h/day. Sister used to ask her to use exercise  bike but she hated it and wouldn't do it. She is htinking about  Comptroller. Goal: Keep track of what she is  starting with mindful ofhow much fruit/juice and try reduce by 1  cup/1 serving. Maybe try fruit infusions. or fizzy.  [name]  CURRENT MEDICATIONS  .  Taking Multivitamin Capsule Oral  Taking Sertraline HCl 25 MG Tablet 1 tablet Orally Once a dayx 4  days, then 2 tablets twice daily if needed  Taking SUMAtriptan Succinate 50 MG Tablet 1 tablet at least 2  hours between doses as needed Orally Twice a day  Not-Taking/PRN  Centrum - Tablet Orally , Notes: PRN  Not-Taking/PRN Excedrin Migraine  Not-Taking/PRN iron 1 tab Oral , Notes: PRN  Not-Taking/PRN Lexapro 5 MG Tablet 1 tablet Orally Once a day  .  VITAL SIGNS  .  Ht-in 64.5, Ht-cm 165.1No updtaed wt todya, pt does not report  any suspected wt loss.  .  ASSESSMENTS  .  Morbid obesity due to excess calories - E66.01 (Primary)  .  Dietary counseling and  surveillance - Z71.3  .  BMI 40.0-44.9, adult - Z68.41  .  Pt is here for the Jumpstart to Wellness Program/MWL  program_________WT CHANGES: 1ST VISIT, N/A_________NUTR. NEEDS:  REE= Calories/day: <=1600 kcals ([place] Jeor)Carbohydrates:  <165g, <=50g/meal, <=20g/snackProtein/day: grams (0.8g/kg actual  body weight or 1.5-2g/kg IBW for BMI >50)Fluids: 64  oz./day_________NUTR. MARKERS:Calrores, Carbohydrates, Protein,  Fluid, otherAssessment: Excess kcals from mindles snacking  including on "healthier" foods like nuts and fruits, juices,  sweet baked goods. _________NUTR DX:1.Overweight / obesity  secondary to excessive energy intake, environmental factors as  evidenced by pt interview, BMI >  30.Barriers/Challenges/Readiness: Lower health/nutrition  literacy, feels overhwhelmed by changes with busy schedule, low  confidence in ability to [facility] to changes.  Support/Strengths: Sister, visits. _________EDUCATION  TOPICS/DISCUSSION:Nutrition Targets for weight-loss: Calories  (deficit), Carbohydrates (controlled), energy balance for  metabolism. Nutrition for health: Fiber foods- vegetables  (starchy & nonstarchy), whoole grains, fruits, lean proteins,  healthy fats. Meals: Meal planning, Meal timing, balanced meals,  portion control methods, Plate Method, food logging/self  monitoring, Lifestyle: Problem solving, stimulus control,healthy  habits, goal setting, self monitoring. Exercise targets.. Pt is  here for the Jumpstart to Wellness Program._________WT CHANGES:  Up 2 lb_________NUTR. NEEDS:Protein/day: 90 grams (0.8g/kg actual  body weight)Fluids: 64 oz./day_________NUTR. GOALS:Exceeding  calorie needs: YMeeting protein needs: YMeeting fluid needs:  NAssessment: Pt without structure or consistency with meals and  snacks, craves sugar, struggles with late night eating for  comfort_________NUTR DX:1.Obesity secondary to excessive energy  intake, environmental factors as evidenced by pt interview, BMI  >  30._________EDUCATION:-Validated pt's concerns about wt gain,  emphasized importance of support from others , and educated on  idea of eating smarter not harder or less-Suggested MWL program  for team's support-Suggested different strategies to help  decrease late night eating and self-soothe without  food-Emphasized value of food logging for self-awareness,  mindfulness, and accountability-Educated on nutrient dense foods  and filling up on more of them in place of foods like  cereal-Discussed importance of avoiding negative selftalk for  long term successSMART Goals-1. Food logging-Start with one meal  daily2. Continue with exercise in wetsuit but remaining cautious  of signs of heat distress. Avoid when gets hotter outside3. When  not able to sleep try:- hot herbal tea such as ones that include  chamomile, ashwagandha, or lavendar.-brushing teeth again -Taking  warm/hot shower or bath-Having low-sodium broth or broth-based  veggie soup4. Avoid having cereal, best to remove from house for  now.  .  TREATMENT  .  Dietary counseling and surveillance  Notes: Follow-up MWL with RD for 30 minutes via Telehealth,  doximity. Pt reiterates that she has made improvement in her  overall sugar intake such as by cutting out soda, but that she  still eats/drinks excess amount s of sugar from fruit/fruit  smoothies, and occasional baked goods and craving treats such as  sweetened condensed milk with water. Pt has also noted she has  trouble portion controlling nuts that she snacks on. Discussion  today revealed pt with long-standing roots  of choosing sweet  beverages vs food in part because drinking water was not  necessarily safe growing up. RD and pt discuss continuing to work  on her physical/emotional attachment to sugar as pt reveals her  body is attuned to the high sugar intake and she is not used to  how she feels/amount of times she needs to go to the rest room  when she drinks regular water/does not enjoy sugar  alternatives.  PT and RD agree pt will work on writing down how much  fruit/beverages and food she is eating and work to slowly  reduce/replace some of these items with plan or fruit infused  water or a more solid small meal such as a sandwich. Pt should  continue to work with [employer] on roots of sugar  fixation and see Endocrinologist to look into whether medication  may help- (pt BG is normal though family Hx of Diabetes, but  cholesterol elevated). F/u 1.5 months.  .  Resources: Meal Matrix, Exchange template, Meal replacement  template, snack ideas, resource list, [place].  .  Nutrition & Lifestyle goals:  .  1. Diet: Create 250-750 kcal deficit from food swaps, portion  control, reduced servings. Use food logging to assist.  .  2. Lifestyle: Work on more regular eating patterns, evenly spaced  meals, meal planning, self-monitoring.  .  3. Exercise: Aim to meet national standards >=30 minutes/day  light-moderate physical activity and >=75 minutes/wk higher  intensity exercise.  [name]  PROCEDURE CODES  .  54098 Reassessment Established patient F/U (each 15 minutes),  Units: 2.00 , Modifiers: GT  .  FOLLOW UP  .  6 Weeks  .  Electronically signed by Lyda Kalata on 04/07/2020  at 02:28 PM EDT  .  Document electronically signed by Lyda Kalata    .

## 2020-04-08 ENCOUNTER — Ambulatory Visit

## 2020-04-12 ENCOUNTER — Other Ambulatory Visit (HOSPITAL_BASED_OUTPATIENT_CLINIC_OR_DEPARTMENT_OTHER): Admitting: Internal Medicine

## 2020-04-12 NOTE — Progress Notes (Signed)
* * *        **  [name] [name]**    --- ---    40 Y old Female, DOB: 11-21-1970    [address], [place], [place] [address]    Home: [phone]    Provider: Toya Smothers, MD        * * *    Telephone Encounter    ---    Answered by   Patrick North  Date: 03/01/2016         Time: 11:08 AM    Reason   Needs EMB begining of March    --- ---            Message                      Patient needs an EMB in the begining of March with Dr. Renato Gails so that she can have surgery with her at the end of March. No appointments available in templates.                Action Taken   [facility] 03/01/2016 11:08:38 AM > appt. 03/15/16  with Dr. Bobetta Lime 03/02/2016 7:31:19 AM >                * * *                ---          * * *          Patient: [name] [name], [name] [name] DOB: [birth year] Provider: Toya Smothers, MD  03/01/2016    ---    Note generated by eClinicalWorks EMR/PM Software ([email or web address])

## 2020-04-12 NOTE — Progress Notes (Signed)
* * *        **  [name] [name]**    --- ---    42 Y old Female, DOB: 10/10/1970    [address], [place], [place] [address]    Home: [phone]    Provider: Toya Smothers, MD        * * *    Telephone Encounter    ---    Answered by   Patrick North  Date: 05/14/2016         Time: 09:49 AM    Reason   Admitted/draining    --- ---            Message                      Hassell Done fromt he ED called about this patient who is getting admitted saying that the patient is missing RX's and cyst is draining, her number is [phone].                 Action Taken   [facility] 05/14/2016 9:52:05 AM > McGunigle,Julia A  05/14/2016 10:14:38 AM > forward to gabby, contacted [facility], confirmed no  patient by that name in ED or being admitted according to [facility] pt d/c 4/29,  if patient has questions about rx need to call floor Jmcgunigle RN  [facility] 05/14/2016 10:17:13 AM > Spoke to Rico and patient and  told them to contact the ED where she was seen and given the RXs, I transfered  her to the ED, they expressed understanding.                * * *                ---          * * *          PatientJoanne [name] DOB: Jul 08, 1970 Provider: Toya Smothers, MD  05/14/2016    ---    Note generated by eClinicalWorks EMR/PM Software ([email or web address])

## 2020-04-12 NOTE — Progress Notes (Signed)
* * *        **  [name] [name]**    --- ---    68 Y old Female, DOB: 09-03-1970    [address], [place], [place] [address]    Home: [phone]    Provider: Toya Smothers, MD        * * *    Telephone Encounter    ---    Answered by   Gae Gallop  Date: 12/28/2015         Time: 03:51 PM    Caller   Jeani    --- ---            Reason   FIBROIDS/Reschedule/Missed Appt            Message                      Hello,      Patient calling in regards to recieving a card that states she missed an appointment on 12/5. Patient states when she went to the hospital in Primary Care they told her the appointment was on 1/5 with Dr. Renato Gails. Patient is upset and would still like to be seen on that day for Fibroids or can come in earlier. Patient states she can also come in tomorrow. Best number to reach her is [phone]. Thank you!                Action Taken   [facility] 12/28/2015 3:55:18 PM > appt. made for 03/01/16  with Dr. Bobetta Lime 12/29/2015 8:19:11 AM >                * * *                ---          * * *          Patient: [name] [name], [name] [name] DOB: [birth year] Provider: Toya Smothers, MD  12/28/2015    ---    Note generated by eClinicalWorks EMR/PM Software ([email or web address])

## 2020-04-12 NOTE — Progress Notes (Signed)
* * *        **  [name] [name]**    --- ---    58 Y old Female, DOB: 01-23-1970    [address], [place], [place] [address]    Home: [phone]    Provider: Toya Smothers, MD        * * *    Telephone Encounter    ---    Answered by   Tilden Dome  Date: 12/21/2015         Time: 02:21 PM    Reason   No Show    --- ---            Message                      Missed appointment notice card sent to patient to reschedule.                Action Taken   Pishkin,Lori 12/21/2015 2:21:48 PM >                * * *                ---          * * *          PatientNESIAH, [name] [name] DOB: 1970/07/26 Provider: Toya Smothers, MD  12/21/2015    ---    Note generated by eClinicalWorks EMR/PM Software ([email or web address])

## 2020-04-12 NOTE — Progress Notes (Signed)
* * *        [name] [name]**    --- ---    50 Y old Female, DOB: 1970/07/07, External MRN: [id number]    Account Number: [id number]    [address], [name], [place]-[address]    Home: [phone]    Insurance: [employer] [place] [employer] [place]    PCP: [name] Referring: [name]    Appointment Facility: Gynecology        * * *    06/07/2016  Progress Notes: Toya Smothers, MD **CHN#:** [phone]    --- ---    ---        Reason for Appointment    ---      1\. 1 WK. F/U, PER DR.Gadiel John    ---      History of Present Illness    ---     _GENERAL_ :    50yo G0 about 3 weeks s/p TAH/BS/LOA for multifibroid uterus. Being seen by  [facility] for wet to dry dressing changes for opening in lower part of vertical skin  incision, which is now closed. c/o increased incisional and perineal pain  after doing a full day of walking. She has suboptimal pain control but has  only been taking oxycodone at night. She has booked a ticket to [place] for  06/05/16. Denies fever/chills/n/v.      Current Medications    ---    Taking     * Centrum - Tablet Orally     ---    * Colace 100 MG Capsule 1 capsule as needed Orally Once a day    ---    * iron 1 tab Oral     ---    * Leuprolide Acetate (3 Month) 22.5 mg Kit as directed Intramuscular every 12 weeks    ---    * Motrin     ---    * oxycodone     ---    * tylenol 1 tab Oral     ---    Not-Taking/PRN    * None     ---    * Medication List reviewed and reconciled with the patient    ---      Past Medical History    ---       Uterine fibroids.        ---    Obesity.        ---    Fibrocystic breast disease.        ---    Umbilical hernia.        ---    Back pain.        ---      Surgical History    ---      Total abdominal hysterectomy, bilateral salpingectomy, umbilical hernia  repair 04/2016    ---      Family History    ---      Non-Contributory    ---      Social History    ---    Tobacco    history: _Never smoked_    Marital Status    _Single_    Alcohol    _Denies_       Allergies    ---       penicillin : hives,swelling: Allergy    ---    choroquine: hives, inbalance: Allergy    ---      Hospitalization/Major Diagnostic Procedure    ---      s/p TAH/BS  x 3 days 04/2016    ---      Review of Systems    ---     _OB/GYN ROS_ :    General no fevers, no weakness, no memory loss, no swollen glands, no  bruising, no weight loss, no constant fatigue, no physical abuse, no emotional  abuse. HEENT no visual problems, no eye pain, no ear pain, no difficulty  hearing, no headaches, no sinus trouble. Skin no changing moles, no rash, no  jaundice. Cardiovascular no chest pains, no palpitations, no swelling, no  dizziness, no murmurs. Respiratory no shortness of breath, no cough, no  wheezing. GI no nausea, no vomiting, no loss of appetite, no constipation, no  diarrhea. Urinary no painful urination, no leakage of urine, no frequent  urination, no blood in urine. Musculoskeletal no joint swelling, no joint  stiffness. Breast no breast lumps, no breast pain, no nipple discharge. Psych  no depression, no anxiety.          Vital Signs    ---    Pain scale 0, Ht-in 65, Wt-lbs 222.2, BMI 36.97, BP 107/73, HR 78, BSA 2.15,  Ht-cm 165.1, Wt-kg 100.79, Wt Change 2.4 lb.      Physical Examination    ---     _OB/Gyn General_ :    General Appearance: well-appearing, no acute distress.    Mental Status: alert and oriented.    Mood/Affect: pleasant.    _OB/Gyn Abdomen_ :    General: soft, incision healing well, incision closed, no erythema, no pus.    Tenderness: nontender.    Masses: none.          Assessments    ---    1\. Postoperative seroma of subcutaneous tissue after non-dermatologic  procedure - L76.34 (Primary)    ---      50y G0 s/p TAH/BS/LOA with small area of incision that is now is closed.    --Recommended staying on top of pain and resting more    --Will be going to [place] this weekend (he    --Otherwise f/u 1 yr for annual exam.    ---      Follow Up    ---    1 Year    Electronically signed by Toya Smothers MD on  06/08/2016 at 05:38 PM EDT    Sign off status: Completed        * * *        Gynecology    [address]    [place], [place] [address]    Tel: [phone]    Fax: [phone]              * * *          Patient: [name] [name], [name] [name] DOB: 10/07/1970 Progress Note: Toya Smothers, MD  06/07/2016    ---    Note generated by eClinicalWorks EMR/PM Software ([email or web address])

## 2020-04-12 NOTE — Progress Notes (Signed)
* * *        **  [name] [name]**    --- ---    89 Y old Female, DOB: Feb 04, 1970    [address], [place], [place] [address]    Home: [phone]    Provider: Jaquita Folds, MD        * * *    Telephone Encounter    ---    Answered by   Jaquita Folds  Date: 05/16/2016         Time: 05:18 PM    Caller   MD    --- ---            Reason   Resident add on today            Message                      Pt able to get her narctoic prescription filled, got good rest last night. Feels & sounds significantly better today. Still with some serosanguinous drainage from the incision but only minor on the dressing. Denies fevers. Overall much better than yesterday.                Action Taken   Jaquita Folds, MD 05/15/2016 5:23:08 PM > Advised patient to  come to clinic at 3pm for evaluation. Gave precautions including significant  increase in drainage, fever, pain, for which to call and come for earlier  evaluation. [name] - can you please put her on my schedule for 3pm tomorrow and  notify the nursing staff? [name],[name] 05/16/2016 8:11:01 AM > Patient  added to shedule, forwarded to Arlana Pouch                * * *                ---          * * *          Patient: [name] [name] DOB: 1970/09/02 Provider: Jaquita Folds,  MD 05/16/2016    ---    Note generated by eClinicalWorks EMR/PM Software ([email or web address])

## 2020-04-12 NOTE — Progress Notes (Signed)
* * *        **  [name] [name]**    --- ---    74 Y old Female, DOB: 1971-01-07    [address], [place], [place] [address]    Home: [phone]    Provider: Rhunette Croft, MD        * * *    Telephone Encounter    ---    Answered by   Rhunette Croft  Date: 05/18/2016         Time: 11:08 AM    Reason   reminder of apt    --- ---            Message                      Dear clinic,      2nd no show for this pt. Please remind her to attend her apt. with me 5/18. Please double book me at this time given hx of no-show      Thanks,      [name] [name]                Action Taken   Rhunette Croft , MD 05/18/2016 11:09:22 AM > Muir,Tiffany L  05/18/2016 12:39:15 PM > spoke to pt who is unable to make appt for 05/18.  Scheduled for 05/11 at 9:00 as a double book. please advise if this is ok.  Thank you. <i am ok with that. Make sure to delete the apt. for 5/18 then.  Janne Napoleon , MD 05/18/2016 1:23:50 PM > Muir,Tiffany L 05/18/2016 2:09:01  PM > scheduled, spoke to pt directly and mailed appt reminder. all sert. Thank  you                * * *              * * *        ---        Reason for Appointment    ---      1\. Reminder of apt    ---         **Medical History:**   Uterine fibroids.    ---    Obesity.    ---    Fibrocystic breast disease.    ---    Umbilical hernia.    ---    Back pain.    ---      Allergies    ---      penicillin : hives,swelling: Allergy    ---    choroquine: hives, inbalance: Allergy    ---          * * *          PatientOSWIN, [name] [name] DOB: 01/20/1970 Provider: Rhunette Croft, MD  05/18/2016    ---    Note generated by eClinicalWorks EMR/PM Software ([email or web address])

## 2020-04-12 NOTE — Progress Notes (Signed)
* * *        [name] [name]**    --- ---    60 Y old Female, DOB: [birth year], External MRN: [id number]    Account Number: [id number]    [address], [place], [place]    Home: [phone]    Insurance: [place] [employer] [place]    PCP: Phillips Grout, MD Referring: Ailene Ards    Appointment Facility: Surgical Oncology        * * *    11/18/2015  Progress Notes: Andy Gauss, MD **CHN#:** [phone]    --- ---    ---        Reason for Appointment    ---      1\. UMBILICAL HERNIA    ---      History of Present Illness    ---     _GENERAL_ :    Ms. Kempton is a pleasant 50 year old woman who is here today in surgical  consultation for an umbilical hernia. She reports that the hernia has been  present for most of her life however it has recently increased in size. She  reports that it occasionally causes her discomfort however she denies nausea,  vomiting, diarrhea, constipation, fevers, and chills. She is here today to  discuss her treatment options.      Current Medications    ---    Not-Taking/PRN     * None     ---      Past Medical History    ---       Uterine fibroids.        ---    Obesity.        ---    Fibrocystic breast disease.        ---    Umbilical hernia.        ---    Back pain.        ---      Surgical History    ---      Denies Past Surgical History    ---      Social History    ---    Tobacco  history: Never smoked.    Alcohol  Denies.      Allergies    ---      penicillin : hives,swelling: Allergy    ---    choroquine: hives, inbalance: Allergy    ---      Review of Systems    ---     _Surgery_ :    Constitutional: no fever, chills, or general weakness, no recent weight  change. H&N: no ear pain, no hoarseness, no headache. Skin: no skin lesions,  no rash. Lymphatics: no swollen glands, no painful glands. Heart: no chest  pain, no fluttering of heart. Lung: no shortness of breath, no new or frequent  cough. Gastro: no nausea/vomiting, no abdominal pain or cramps, no  constipation  or diarrhea, no blood in stool, no heartburn, no regurgitation.  Urine: no blood in urine, no pain or burning while urinating. Neuro: no dizzy  spells, fainting, no visual loss, no speech disturbance, no motor or sensory  events, no seizures. Musc: no bone or joint pain.          Vital Signs    ---    Pain scale 0, Ht-in 65, Wt-lbs 214, BMI 35.61, BP 105/72, HR 72, BSA 2.11, O2  100, Ht-cm 165.1, Wt-kg 97.07, Temp 98.4.      Physical Examination    ---  _Surgery_ :    General Appearance alert, oriented, and in no distress. Gastrointestinal  abdomen soft and nontender, reducible umbilical hernia.          Assessments    ---    1\. Umbilical hernia without obstruction and without gangrene - K42.9  (Primary)    ---      Treatment    ---       **1\. Umbilical hernia without obstruction and without gangrene**    Notes: Ms. Upham has a reducible umbilical hernia that has increased in size  and is causing her discomfort. Today her treatment options were discussed  which included an umbilical hernia repair with mesh or continue surveillance.  She wishes to discuss these options with her family prior to making a  decision. She does plan to have surgery for uterine fibroids in the future and  would like to potentially have the hernia repaired at the same time. She will  contact our office after her appointment with gynecology next month to let us  know how she wishes to proceed. She will contact our office with any further  questions or concerns arise.    Thank you for allowing [place] to participate in the care of your patient. If you  have any questions or comments, please do not hesitate to contact our office.    ---      Follow Up    ---    prn    Electronically signed by Andy Gauss , MD on 11/18/2015 at 02:35 PM EDT    Sign off status: Completed        * * *        Surgical Oncology    [address]    [place], [place] [address]    Tel: [phone]    Fax: [phone]              * * *          Patient: [name] [name], [name] [name] DOB: Oct 10, 1970 Progress Note: Andy Gauss,  MD 11/18/2015    ---    Note generated by eClinicalWorks EMR/PM Software ([email or web address])

## 2020-04-12 NOTE — Progress Notes (Signed)
* * *        **  [name] [name]**    --- ---    81 Y old Female, DOB: 12/02/1970    [address], [place], [place] [address]    Home: [phone]    Provider: Melburn Popper        * * *    Telephone Encounter    ---    Answered by   Claud Kelp  Date: 07/12/2016         Time: 01:07 PM    Reason   apt reminder    --- ---            Message                      Called patient and LM with a reminder that she is scheduled with Dr. Evangeline Gula tomorrw 6/29 for a NP apt. I left my phone number in case she needs to reschedule.                 Action Taken   [facility] 07/12/2016 1:08:19 PM >                * * *                ---          * * *          Patient: [name] [name], [name] [name] DOB: 05-Mar-1970 Provider: Melburn Popper  07/12/2016    ---    Note generated by eClinicalWorks EMR/PM Software ([email or web address])

## 2020-04-12 NOTE — Progress Notes (Signed)
* * *        **  [name]**    --- ---    55 Y old Female, DOB: 09-08-1970    [address], [place], [place] [address]    Home: [phone]    Provider: Rhunette Croft, MD        * * *    Telephone Encounter    ---    Answered by   Rhunette Croft  Date: 05/04/2016         Time: 10:08 AM    Reason   REscedule    --- ---            Message                      Dear clinic,      Please reschedule this pt.       Thanks, Bascom Biel                Action Taken   [employer] , MD 05/04/2016 10:09:45 AM > Muir,Tiffany L  05/04/2016 10:16:02 AM > rescheduled for 05/04 at 10:30, left detailed message  and mailed appt card <ok, tnx Jon Kasparek                * * *                ---          * * *          Patient: [name] [name], [name] [name] DOB: Mar 08, 1970 Provider: Rhunette Croft, MD  05/04/2016    ---    Note generated by eClinicalWorks EMR/PM Software ([email or web address])

## 2020-04-12 NOTE — Progress Notes (Signed)
* * *        [name] [name]**    --- ---    89 Y old Female, DOB: 1970-11-22, External MRN: [id number]    Account Number: [id number]    [address], [place], [place]    Home: [phone]    Insurance: [employer] IN [place] Payer ID: PAPER    PCP: Tonny Branch, MD Referring: Tonny Branch, MD External Visit ID:  [id number]    [facility]        * * *    02/02/2016  Reinaldo Meeker, MD **CHN#:** [phone]    --- ---    ---        Current Medications    ---    Taking     * Centrum - Tablet Orally     ---    Not-Taking/PRN    * None     ---    * Medication List reviewed and reconciled with the patient    ---      Past Medical History    ---       Uterine fibroids.        ---    Obesity.        ---    Fibrocystic breast disease.        ---    Umbilical hernia.        ---    Back pain.        ---      Surgical History    ---      Denies Past Surgical History    ---      Social History    ---    Tobacco history: Never smoked.    Alcohol  Denies.      Allergies    ---      penicillin : hives,swelling: Allergy    ---    choroquine: hives, inbalance: Allergy    ---      Hospitalization/Major Diagnostic Procedure    ---      Denies Past Hospitalization    ---      Review of Systems    ---    Denies fever chills    Denies any nausea or vomiting    Denies constant abdominal pain.        Reason for Appointment    ---      1\. UMB HERNIA CONSLT R/S FRM 1/4JH    ---      History of Present Illness    ---     _GENERAL_ :    Patient is a pleasant 50 year old female who comes today for evaluation of a  moderate size umbilical hernia. Patient states that she has had this since  birth and has gotten bigger as she is gained weight over the years. It has  been bothering her more and more recently. In addition the patient states that  she is having surgery for fibroid removal and would like to have this fixed at  the same time. Patient denies any nausea vomiting or constant abdominal pain.       Vital Signs    ---    Pain scale 0, Ht-in 65, Wt-lbs 224.2, BMI 37.30, BP 105/68, HR 82, Temp 98.3,  BSA 2.16, O2 97, Ht-cm 165.1, Wt-kg 101.7, Wt Change 10.2 lb.      Physical Examination    ---    Abdomen: Soft nontender obese    Moderate sized reducible umbilical  hernia.      Assessments    ---    Patient is a 51 year old female with a large reducible umbilical hernia.  Patient wishes to get this repaired the same time as her fibroid surgery. We  will try to coordinate this with GYN.    ---    Electronically signed by Reinaldo Meeker MD on 02/03/2016 at 03:13 PM EST    Sign off status: Completed        * * *        [facility]    [address]    [place], [place] [address]    Tel: [phone]    Fax: [phone]              * * *          Patient: [name] [name], [name] [name] DOB: 05-Feb-1970 Progress Note: Reinaldo Meeker, MD  02/02/2016    ---    Note generated by eClinicalWorks EMR/PM Software ([email or web address])

## 2020-04-12 NOTE — Progress Notes (Signed)
* * *        **  [name] [name]**    --- ---    46 Y old Female, DOB: [birth year]    [address], [place], [place] [address]    Home: [phone]    Provider: Toma Aran        * * *    Telephone Encounter    ---    Answered by   Loel Lofty  Date: 11/01/2017         Time: 11:59 AM    Caller   PATIENT    --- ---            Reason   MRI            Message                      Good Afternoon,      Patient is calling to schedule MRI in [place]) , and they are asking that the referral come directly from Dr. Donnajean Lopes. Please contact patient at [phone] once referral has been sent, thank you.                Action Taken                      Benoit,Kyara  11/01/2017 12:03:01 PM >       Pavielle Biggar  11/04/2017 8:37:47 AM > Enrique Sack, I put the request for MRI brain w and w/o in again under virtual visits.  Can you send in the referral to [facility] in [place]?  Can you let the patient know when it is done? Thanks.                    * * *              * * *        ---        Reason for Appointment    ---      1\. MRI    ---      Assessments    ---    1\. Migraine without aura and without status migrainosus, not intractable -  G43.009    ---      Treatment    ---       **1\. Migraine without aura and without status migrainosus, not  intractable**    _IMAGING: MR HEAD W + WO CONTRAST_    ---          * * *           Patient: [name], [name] DOB: 03/03/1970 Provider: Toma Aran  11/01/2017    ---    Note generated by eClinicalWorks EMR/PM Software ([email or web address])
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
## 2020-04-12 NOTE — Progress Notes (Signed)
* * *        [name] [name]**    --- ---    50 Y old Female, DOB: [birth year], External MRN: [id number]    Account Number: [id number]    [address], [place], [place]    Home: [phone]    Insurance: [employer] IN [place]    PCP: Tonny Branch, MD Referring: Tonny Branch, MD    Appointment Facility: Gynecology        * * *    03/15/2016  Progress Notes: Toya Smothers, MD **CHN#:** [phone]    --- ---    ---        Reason for Appointment    ---      1\. ENDOMETRIAL BIOPSY    ---      History of Present Illness    ---     _GENERAL_ :    75QV G0 here for EMB and consent for TAH/BS for fibroids and menorrhagia.    She discussed her options with her sister (who is an OB/GYN) and has decided  on pursuing a TAH/BS. She is also interested in using something to decrease  her bleeding until we can schedule the surgery.      Current Medications    ---    Taking     * Centrum - Tablet Orally     ---    * iron 1 tab Oral     ---    Not-Taking/PRN    * None     ---    * Medication List reviewed and reconciled with the patient    ---      Past Medical History    ---       Uterine fibroids.        ---    Obesity.        ---    Fibrocystic breast disease.        ---    Umbilical hernia.        ---    Back pain.        ---      Surgical History    ---      Denies Past Surgical History    ---      Family History    ---      Non-Contributory    ---      Social History    ---    Tobacco    history: _Never smoked_    Marital Status    _Single_    Alcohol    _Denies_       Allergies    ---      penicillin : hives,swelling: Allergy    ---    choroquine: hives, inbalance: Allergy    ---      Hospitalization/Major Diagnostic Procedure    ---      Denies Past Hospitalization    ---      Review of Systems    ---     _OB/GYN ROS_ :    General no fevers, no weakness, no memory loss, no swollen glands, no  bruising, no weight loss, no constant fatigue, no physical abuse, no emotional  abuse. HEENT no visual problems, no eye pain,  no ear pain, no difficulty  hearing, no headaches, no sinus trouble. Skin no changing moles, no rash, no  jaundice. Cardiovascular no chest pains, no palpitations, no swelling, no  dizziness, no murmurs. Respiratory no shortness of breath, no cough, no  wheezing. GI no nausea, no vomiting, no loss of appetite, no constipation, no  diarrhea. Urinary no painful urination, no leakage of urine, no frequent  urination, no blood in urine. Musculoskeletal no joint swelling, no joint  stiffness. Breast no breast lumps, no breast pain, no nipple discharge. Psych  no depression, no anxiety.          Vital Signs    ---    Pain scale 0, Ht-in 65, Wt-lbs 224.4, BMI 37.34, BP 110/60, Wt-kg 101.79.      Physical Examination    ---     _Endometrial Biopsy_ :    Procedure: Indications for procedure explained. Written and verbal informed  consent obtained. Speculum placed and cervix visualized. Cervix swabbed with  betadine. Endometrial pipelle placed through cervicalos, into uterus.    Single toothed tenaculum placed on the anterior lip of the cervix: Yes.    Uterus sounded to: >12cm.    Number of passes made with pipelle: 1.    Amount of tissue obtained: moderate.    The patient tolerated the procedure: well.    _OB/Gyn General_ :    General Appearance: well-appearing, no acute distress.    Mental Status: alert and oriented.    Mood/Affect: pleasant.    _OB/Gyn Genitourinary_ :    External Genitalia: normal.    Vagina: normal, pink/moist mucosa, no lesions, no abnormal discharge.    Cervix: normal appearance, no lesions/discharge/bleeding, no cervical motion  tenderness.    Uterus: unable to palpate on bimanual exam.    Adnexa: non-palpable.    Urethra Normal.    Bladder Normal .          Assessments    ---    1\. Menorrhagia with regular cycle - N92.0 (Primary)    ---      50yo G0 here for EMB and consent for surgery.    --EMB performed with the assistance of Dr. Evelena Asa    --Consent for TAH/BS signed. Surgery to be scheduled  05/10/16 in the AM.  Surgery to be scheduled in collaboration with Dr. Sherryll Burger for hernia repair.    --will work on approval for Lupron 3 month depo shot    --Will call patient with pathology results.    ---        Labs    --- ---    _Lab: Surgical (Pathology)_    ---       Procedure Codes    ---      [phone] BIOPSY endometrial, +/- ECC    ---    [phone] URINE PREGNANCY TEST    ---      Follow Up    ---    after surgery    Electronically signed by Toya Smothers MD on 03/15/2016 at 04:38 PM EST    Sign off status: Completed        * * *        Gynecology    [address]    [place], [place] [address]    Tel: [phone]    Fax: [phone]              * * *          Patient: [name] [name], [name] [name] DOB: Feb 08, 1970 Progress Note: Toya Smothers, MD  03/15/2016    ---    Note generated by eClinicalWorks EMR/PM Software ([email or web address])

## 2020-04-12 NOTE — Progress Notes (Signed)
* * *        [name] [name]**    --- ---    50 Y old Female, DOB: February 01, 1970, External MRN: [id number]    Account Number: [id number]    [address] [place], [id number]    Home: [phone]    Insurance: [employer] [place]    PCP: [name] Referring: [name]    Appointment Facility: Gynecology        * * *    05/22/2016  Progress Notes: Toya Smothers, MD **CHN#:** [phone]    --- ---    ---        Reason for Appointment    ---      1\. POST OP PROBLEM VISIT-INCISIONS, PER Ranay Ketter    ---      History of Present Illness    ---     _GENERAL_ :    50yo G0 about 1.5 weeks s/p TAH/BS/LOA for multifibroid uterus. Pt has been  seen in ER and clinic for drainage from lower edge of incision. Has VNA for  wet to dry dressing changes. She is sporadic with taking the oxycodone for  pain control. She would like to go see her sister, and OB/GYN in [place],  for the rest of her recovery. Denies fever/chills/n/v.      Current Medications    ---    Taking     * Centrum - Tablet Orally     ---    * Colace 100 MG Capsule 1 capsule as needed Orally Once a day    ---    * iron 1 tab Oral     ---    * Leuprolide Acetate (3 Month) 22.5 mg Kit as directed Intramuscular every 12 weeks    ---    * Motrin     ---    * oxycodone     ---    * tylenol 1 tab Oral     ---    Not-Taking/PRN    * None     ---    * Medication List reviewed and reconciled with the patient    ---      Past Medical History    ---       Uterine fibroids.        ---    Obesity.        ---    Fibrocystic breast disease.        ---    Umbilical hernia.        ---    Back pain.        ---      Surgical History    ---      Total abdominal hysterectomy, bilateral salpingectomy, umbilical hernia  repair 04/2016    ---      Family History    ---      Non-Contributory    ---      Social History    ---    Tobacco    history: _Never smoked_    Marital Status    _Single_    Alcohol    _Denies_       Allergies    ---      penicillin : hives,swelling: Allergy    ---     choroquine: hives, inbalance: Allergy    ---      Hospitalization/Major Diagnostic Procedure    ---      s/p TAH/BS x 3 days 04/2016    ---  Review of Systems    ---     _OB/GYN ROS_ :    General no fevers, no weakness, no memory loss, no swollen glands, no  bruising, no weight loss, no constant fatigue, no physical abuse, no emotional  abuse. HEENT no visual problems, no eye pain, no ear pain, no difficulty  hearing, no headaches, no sinus trouble. Skin no changing moles, no rash, no  jaundice. Cardiovascular no chest pains, no palpitations, no swelling, no  dizziness, no murmurs. Respiratory no shortness of breath, no cough, no  wheezing. GI no nausea, no vomiting, no loss of appetite, no constipation, no  diarrhea. Urinary no painful urination, no leakage of urine, no frequent  urination, no blood in urine. Musculoskeletal no joint swelling, no joint  stiffness. Breast no breast lumps, no breast pain, no nipple discharge. Psych  no depression, no anxiety.          Vital Signs    ---    Pain scale 0, Ht-in 65, Wt-lbs 217.2, BMI 36.14, BP 93/66, HR 69, BSA 2.12,  Ht-cm 165.1, Wt-kg 98.52, Wt Change 1.2 lb.      Physical Examination    ---     _OB/Gyn General_ :    General Appearance: well-appearing, no acute distress.    Mental Status: alert and oriented.    Mood/Affect: pleasant.    _OB/Gyn Abdomen_ :    General: soft.    Tenderness: non tender, vertical incision well healed, except for 1.5cm defect  near the lower pole. Packing in place. Removed and repacked with wet dressing.  Defect several cm deep. No erythema..          Assessments    ---    1\. Postoperative seroma of subcutaneous tissue after non-dermatologic  procedure - L76.34 (Primary)    ---      50y G0 s/p TAH/BS/LOA with small area of incision that is still open due to  small seroma. Packed with wet to dry dressing.    --Encouraged daily VNA for we to dry dressing    --Encouraged oxycodone more frequently for pain control    --Will see her next  week before we decide if she is cleared to go to  [place]. I spoke with her sister over the phone about this plan.    ---      Follow Up    ---    1 Week    Electronically signed by Toya Smothers MD on 05/23/2016 at 10:37 AM EDT    Sign off status: Completed        * * *        Gynecology    [address]    [place], [place] [address]    Tel: [phone]    Fax: [phone]              * * *          Patient: [name] [name] DOB: 06-01-1970 Progress Note: Toya Smothers, MD  05/22/2016    ---    Note generated by eClinicalWorks EMR/PM Software ([email or web address])

## 2020-04-12 NOTE — Progress Notes (Signed)
* * *        [name] [name]**    --- ---    36 Y old Female, DOB: 05-Nov-1970, External MRN: [id number]    Account Number: [id number]    [address], [name], [place]    Home: [phone]    Insurance: [employer] IN [place]    PCP: [name] [name] Referring: [name] [name]    Appointment Facility: Gynecology        * * *    05/31/2016  Progress Notes: [name] Smothers, MD **CHN#:** [id number]    --- ---    ---        Reason for Appointment    ---      1\. POST-OP    ---      History of Present Illness    ---     _GENERAL_ :    50yo G0 about 2 weeks s/p TAH/BS/LOA for multifibroid uterus. Being seen by  VNA for wet to dry dressing changes for opening in lower part of vertical skin  incision. Doing well with pain control. Taking oxycodone minimally. Would like  to drive. Still wanting to go see her sister in [place]. Denies  fever/chills/n/v.      Current Medications    ---    Taking     * Centrum - Tablet Orally     ---    * Colace 100 MG Capsule 1 capsule as needed Orally Once a day    ---    * iron 1 tab Oral     ---    * Leuprolide Acetate (3 Month) 22.5 mg Kit as directed Intramuscular every 12 weeks    ---    * Motrin     ---    * oxycodone     ---    * tylenol 1 tab Oral     ---    Not-Taking/PRN    * None     ---    * Medication List reviewed and reconciled with the patient    ---      Past Medical History    ---       Uterine fibroids.        ---    Obesity.        ---    Fibrocystic breast disease.        ---    Umbilical hernia.        ---    Back pain.        ---      Surgical History    ---      Total abdominal hysterectomy, bilateral salpingectomy, umbilical hernia  repair 04/2016    ---      Family History    ---      Non-Contributory    ---      Social History    ---    Tobacco    history: _Never smoked_    Marital Status    _Single_    Alcohol    _Denies_       Allergies    ---      penicillin : hives,swelling: Allergy    ---    choroquine: hives, inbalance: Allergy    ---       Hospitalization/Major Diagnostic Procedure    ---      s/p TAH/BS x 3 days 04/2016    ---      Review of Systems    ---  _OB/GYN ROS_ :    General no fevers, no weakness, no memory loss, no swollen glands, no  bruising, no weight loss, no constant fatigue, no physical abuse, no emotional  abuse. HEENT no visual problems, no eye pain, no ear pain, no difficulty  hearing, no headaches, no sinus trouble. Skin no changing moles, no rash, no  jaundice. Cardiovascular no chest pains, no palpitations, no swelling, no  dizziness, no murmurs. Respiratory no shortness of breath, no cough, no  wheezing. GI no nausea, no vomiting, no loss of appetite, no constipation, no  diarrhea. Urinary no painful urination, no leakage of urine, no frequent  urination, no blood in urine. Musculoskeletal no joint swelling, no joint  stiffness. Breast no breast lumps, no breast pain, no nipple discharge. Psych  no depression, no anxiety.          Vital Signs    ---    Pain scale 0, Ht-in 65, Wt-lbs 219.8, BMI 36.57, BP 103/73, HR 79, BSA 2.14,  Ht-cm 165.1, Wt-kg 99.7, Wt Change 2.6 lb.      Physical Examination    ---     _OB/Gyn General_ :    General Appearance: well-appearing, no acute distress.    Mental Status: alert and oriented.    Mood/Affect: pleasant.    _OB/Gyn Abdomen_ :    General: soft, incision healing well, open area much more superficial today,  still requires wet to dry dressing changes, no erythema, no pus.    Tenderness: nontender.    Masses: none.          Assessments    ---    1\. Postoperative seroma of subcutaneous tissue after non-dermatologic  procedure - L76.34 (Primary)    ---      45y G0 s/p TAH/BS/LOA with small area of incision that is still open due to  small seroma. Packed with wet to dry dressing.    --Encouraged daily VNA for wet to dry dressing    --ok to drive    --Will see her next week before we decide if she is cleared to go to  [place].    ---      Follow Up    ---    1 Year    Electronically signed  by [name] Smothers MD on 06/06/2016 at 05:18 PM EDT    Sign off status: Completed        * * *        Gynecology    [address]    [place], [place] [address]    Tel: [phone]    Fax: [phone]              * * *          Patient: [name] [name], [name] [name] DOB: 1970-10-02 Progress Note: [name] Smothers, MD  05/31/2016    ---    Note generated by eClinicalWorks EMR/PM Software ([email or web address])
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
## 2020-04-12 NOTE — Progress Notes (Signed)
* * *        [name]**    --- ---    32 Y old Female, DOB: 17-Apr-1970, External MRN: [id number]    Account Number: [id number]    [address], [place], [place]    Home: [phone]    Insurance: [employer] IN [place] Payer ID: PAPER    PCP: [name] Referring: [name] External Visit ID: [id number]    Appointment Facility: [facility]        * * *    05/16/2016   **Appointment Provider:** Jaquita Folds **CHN#:** [id number]    --- ---      **Supervising Provider:** Merwyn Katos, MD    ---        Current Medications    ---    Taking     * Centrum - Tablet Orally     ---    * iron 1 tab Oral     ---    * Leuprolide Acetate (3 Month) 22.5 mg Kit as directed Intramuscular every 12 weeks    ---    Not-Taking/PRN    * None     ---      Past Medical History    ---       Uterine fibroids.        ---    Obesity.        ---    Fibrocystic breast disease.        ---    Umbilical hernia.        ---    Back pain.        ---      Surgical History    ---      Total abdominal hysterectomy, bilateral salpingectomy, umbilical hernia  repair 04/2016    ---      Family History    ---      Non-Contributory    ---      Social History    ---    Tobacco    history: _Never smoked_    Marital Status    _Single_    Alcohol    _Denies_       Allergies    ---      penicillin : hives,swelling: Allergy    ---    choroquine: hives, inbalance: Allergy    ---    [Allergies Verified]      Review of Systems    ---     _OB/GYN ROS_ :    General no fevers, no weakness, no memory loss, no swollen glands, no  bruising, no weight loss, no constant fatigue, no physical abuse, no emotional  abuse. Cardiovascular no chest pains, no palpitations, no swelling, no  dizziness, no murmurs. Respiratory no shortness of breath, no cough, no  wheezing. GI no nausea, no vomiting, no loss of appetite, no constipation, no  diarrhea. Urinary no painful urination, no leakage of urine, no frequent  urination, no blood in  urine. Musculoskeletal no joint swelling, no joint  stiffness.            Reason for Appointment    ---      1\. EVALUATION-PER Shyloh Krinke    ---      History of Present Illness    ---     _GENERAL_ :    Patient presenting for evaluation after having serous discharge from the  bottom part of her surgical wound two days ago and poor pain control.  Patient  is POD 6 after TAH-BS, umbilical hernia repair.    Re discharge: pt states two days ago she had a lot of fluid discharge, but  over yesterday and today it has been minimal with just small stains on her  abdominal binder.    Re pain: States she has some pain on the dependent most portion of the  incision and feels like the staples are "biting her skin". Otherwise pain well  controlled. States she is taking aleve, motrin and tylenol with occasional  oxycodone use.    Denies cp, palpitations, n/v, fevers. Was sweaty when she woke up this morning  but not sure if that was due to the temperature change.      Vital Signs    ---    Pain scale 7, Ht-in 65, Wt-lbs 214, BMI 35.61, BP 120/83, Pulse sitting 83.      Physical Examination    ---     _OB/Gyn Chest/Thorax_ :    Breath sounds: clear to auscultation.    Respiratory Effort normal.    _OB/Gyn Heart_ :    Rhythm: regular.    Murmurs: none.    Rate: normal.    _OB/Gyn Abdomen_ :    General: soft.    Tenderness: overall non-tender, mild tenderness to palpation of lower portion  of incision that is under the most pressure from large abdomen.    Masses: none.    Distention: none.    Mildline incision with staples in place. No erythema. Small 1cm area of  induration approx 1/2 way between umbilicus and pubic bone that feels like a  small seroma - very small amount of clear fluid expressed (1 drop), no  surrounding erythema or abnormal warmth to the area. Incision otherwise well  healed.      Assessments    ---    1\. Postoperative seroma of subcutaneous tissue after non-dermatologic  procedure - L76.34 (Primary)    ---       Treatment    ---       **1\. Postoperative seroma of subcutaneous tissue after non-dermatologic  procedure**    Notes: Overall patient's incision is healing very well. There is no sign of  infection: no erythema, no purulent discharge, patient afebrile. Small area of  induration likely seroma. Given no signs or symptoms of infection and small  size (1cm max) with minimal drainage, did not remove any staples to drain.  Will continue to monitor. Plan for patient to come to clinic on Friday, when  she will be POD 8, to have her staples removed, which will provide her some  relief.    Advised patient to avoid taking both Motrin and Aleve. Discussed alternating  Motrin and Tylenol with oxycodone for pain not well controlled. Patient stated  understanding and agreement with this plan.    ---      Follow Up    ---    2 - 3 Days (Reason: staple removal)    **Appointment Provider:** Jaquita Folds    Electronically signed by Merwyn Katos , MD on 05/17/2016 at 01:25 PM EDT    Sign off status: Completed        * * *        [facility]    [address]    [place], [place] [address]    Tel: [phone]    Fax: [phone]              * * *  Patient: RAINNA, NEARHOOD DOB: 10-24-1970 Progress Note: Aneita Kiger R  Daleyza Gadomski 05/16/2016    ---    Note generated by eClinicalWorks EMR/PM Software ([email or web address])

## 2020-04-12 NOTE — Progress Notes (Signed)
* * *        **  [name] [name]**    --- ---    36 Y old Female, DOB: August 03, 1970    [address], [place], [place] [address]    Home: [phone]    Provider: Jaquita Folds, MD        * * *    Telephone Encounter    ---    Answered by   Cecil Cranker R  Date: 05/14/2016         Time: 11:32 AM    Caller   [facility]/pt    --- ---            Reason   Pain control, serous drainage            Message                      [facility] called to report pt having moderate amount of serous discharge from the lower 10cm of her incision and peri-umbilically. Also reports pt having significant pain - did not pick up her prescription for oxycodone at the pharmacy yet. Utilizing tylenol and aleve. Pain now 6/10. No bloody discharge. Pt not having fevers.                 Action Taken   Jaquita Folds, MD 05/14/2016 11:32:51 AM > Called patient  and discussed that we would like to evaluate her today or tomorrow for the  drainage. Discussed if she could have a neighbor pick up her pain medicaiton -  they are all currently at work. Discussed possiblity of seeing her today given  her poor pain control but patient stated she would prefer to rest today and  come with her sister. Will call to check in with her this afternoon. Advised  that if she develops a fever she must come to the ED to be evaluated, even if  she needs to call an ambulance. She agreed with that plan.                * * *                ---          * * *          Patient: [name] [name], [name] [name] DOB: 14-Aug-1970 Provider: Jaquita Folds,  MD 05/14/2016    ---    Note generated by eClinicalWorks EMR/PM Software ([email or web address])
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
## 2020-04-12 NOTE — Progress Notes (Signed)
* * *        **  [name]**    --- ---    36 Y old Female, DOB: [birth year]    [address], [place], [place] [address]    Home: [phone]    Provider: Dewitt Rota, MD        * * *    Telephone Encounter    ---    Answered by   Dewitt Rota  Date: 05/19/2016         Time: 10:33 AM    Caller   Patient    --- ---            Reason   Incision dehiscence            Message                      Patient called to report she had a TAH 9 days ago and her staples were removed yesterday. She reports that since the staples were removed, her incision has opened up about 4 cm and has been leaking serosanguinous fluid. She denies fever, chills, nausea, vomiting. She reports she continues to have mild incisional pain.                 Action Taken   Dewitt Rota, MD 05/19/2016 10:33:13 AM > Pt was  counseled to come to the ED for further evaluation. The pt was agreeable with  this plan and the ED was notified of her expected arrival. D/w Dr. Matt Holmes.                * * *                ---          * * *          PatientTULIP, MEHARG DOB: Aug 18, 1970 Provider: Dewitt Rota, MD 05/19/2016    ---    Note generated by eClinicalWorks EMR/PM Software ([email or web address])

## 2020-04-12 NOTE — Progress Notes (Signed)
* * *        [name] [name]**    --- ---    50 Y old Female, DOB: February 06, 1970, External MRN: [id number]    Account Number: [id number]    [address], [place], [place]    Home: [phone]    Insurance: [employer] [place] Payer ID: PAPER    PCP: [name] Referring: [name] External Visit ID: [id number]    [facility]        * * *    05/18/2016   **Appointment Provider:** Janie Morning **CHN#:** [id number]    --- ---      **Supervising Provider:** Welton Flakes, MD    ---        Current Medications    ---    Taking     * Centrum - Tablet Orally     ---    * Colace 100 MG Capsule 1 capsule as needed Orally Once a day    ---    * iron 1 tab Oral     ---    * Motrin     ---    * oxycodone     ---    * tylenol 1 tab Oral     ---    Not-Taking/PRN    * None     ---    * Medication List reviewed and reconciled with the patient    ---      Past Medical History    ---       Uterine fibroids.        ---    Obesity.        ---    Fibrocystic breast disease.        ---    Umbilical hernia.        ---    Back pain.        ---      Allergies    ---      penicillin : hives,swelling: Allergy    ---    choroquine: hives, inbalance: Allergy    ---    Forrestine Him Verified]        Reason for Appointment    ---      1\. STAPLE REMOVAL    ---      History of Present Illness    ---     _GENERAL_ :    50 yo POD8 TAH-BS for fibroid uterus presents for staple removal.    Patient is doing well. Some rare spotting but no heavy bleeding. Some pain but  using Motrin and Tylenol less frequently and Oxy only rarely. No f/c, n/v.  Eating, ambulation. Is taking Colace. No constipation.      Vital Signs    ---    Pain scale 6, Ht-in 65, Wt-lbs 216, BMI 35.94, BP 128/87, Temp 97.7, Pulse  sitting 88.      Physical Examination    ---     _OB/Gyn General_ :    General Appearance: well-appearing, no acute distress.    _OB/Gyn Abdomen_ :    General: soft, midline incision from pubic bone to  above umbilicus, c/d/i with  staples in place. No erythema, warmth, or drainage..    Tenderness: appropriately tender near incision.          Assessments    ---    1\. Encounter for staple removal - Z48.02 (Primary)    ---      Treatment    ---       **  1\. Encounter for staple removal**    Notes: 50 yo POD8 TAH-BS for staple removal. Uncomplicated removal. Will  follow up with Dr. Renato Gails in 2 weeks for post-op appointment.    ---      Follow Up    ---    2 Weeks    **Appointment Provider:** Janie Morning    Electronically signed by Welton Flakes , MD on 05/21/2016 at 08:44 AM EDT    Sign off status: Completed        * * *        [facility]    [address]    [place], [place] [address]    Tel: [phone]    Fax: [phone]              * * *          Patient: [name] [name], [name] [name] DOB: [birth year] Progress Note: Janie Morning  05/18/2016    ---    Note generated by eClinicalWorks EMR/PM Software ([email or web address])

## 2020-04-12 NOTE — Progress Notes (Signed)
* * *        **  [name]**    --- ---    35 Y old Female, DOB: [birth year]    [address], [place], [place] [address]    Home: [phone]    Provider: Herbie Drape        * * *    Telephone Encounter    ---    Answered by   Herbie Drape  Date: 08/01/2017         Time: 10:33 AM    Message                      Truddie Hidden,            I would like to request that this patient be blocked from rescheduling with me. She has now missed two new patient appointments with me and has rescheduled (mid-appointment) to another date in August. If she wants to be seen here, I would ask that she be scheduled into a resident slot or with another provider.            Thanks,      Konrad Dolores        --- ---            Action Taken                      Dr. Claudius Sis            Unfortunately there isnt a way in the system to block a patient from making an appointment. It looks like she has scheduled an appt in August with a different provider. I will call her a few days prior to confirm that appt and remind her of the importance of keeping that appt. I will let her know if she is unable to come she should cancel at least24 hr in advance.      Lawson,Stacey  08/15/2017 7:45:51 AM >                     * * *                ---          * * *          Patient: [name], [name] DOB: [birth year] Provider: Herbie Drape  08/01/2017    ---    Note generated by eClinicalWorks EMR/PM Software ([email or web address])

## 2020-04-12 NOTE — Progress Notes (Signed)
* * *        **  [name]**    --- ---    67 Y old Female, DOB: 03/21/1970    [address], [place], [place] [address]    Home: [phone]    Provider: Toma Aran        * * *    Telephone Encounter    ---    Answered by   Magdalene Patricia  Date: 11/04/2017         Time: 12:02 PM    Caller   [facility]    --- ---            Reason   PA            Message                      Good Morning,             stella stated that there is no PA for the pt. MRI order. Francena Hanly can be reached at, [phone] and can speak with anyone.             Thank you,                             Action Taken                      [facility]  11/04/2017 12:04:23 PM >             Dolor,Kendra  11/04/2017 12:36:55 PM > [id number] APPROVED dec. 20TH 2019                    * * *                ---          * * *          Patient: [name], [name] DOB: 07/09/1970 Provider: Toma Aran  11/04/2017    ---    Note generated by eClinicalWorks EMR/PM Software ([email or web address])
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
## 2020-04-12 NOTE — Progress Notes (Signed)
* * *        **  [name] [name]**    --- ---    83 Y old Female, DOB: Nov 09, 1970    [address], [place], [place] [address]    Home: [phone]    Provider: Toya Smothers, MD        * * *    Telephone Encounter    ---    Answered by   Montine Circle  Date: 03/20/2016         Time: 03:12 PM    Reason   RX PA APPROVED: LUPRON DEPOT 22.5MG  EXP 06/22/16    --- ---            Message                      Verbal for Lupron 3 month injection called into [facility].                 Action Taken   McGunigle,Julia A 03/20/2016 3:15:39 PM > Verbal for Lupron 3  month injection called into [facility]. McGunigle,Julia A 03/21/2016  9:16:20 AM > Confirmed with [facility], PA submitted to PA dept. rx was  [id number] option9, updated clinic note with addendum JmcgunigleRN Clark,Karen  03/22/2016 3:43:51 PM > approved. RX prior authorization has been approved for  Lupron Depot 22.5mg  kit per [facility] [phone]. Effective dates are  03/22/16 to 06/22/16. PA # [id number]. Approval letter has been scanned into [facility]  Patient Docs under Misc. Medication may be dispensed from [facility].  McGunigle,Julia A 03/23/2016 8:52:11 AM > MD aware, LM for pt to call back and  schedule [facility] appointment and pick up medication Jmcgunigle RN            Refills  Start Leuprolide Acetate (3 Month) Kit, 22.5 mg, Intramuscular, 1, as  directed, every 12 weeks, 90 days, Refills=0    --- ---          * * *                ---          * * *          PatientJoanne [name] DOB: 11/29/1970 Provider: Toya Smothers, MD  03/20/2016    ---    Note generated by eClinicalWorks EMR/PM Software ([email or web address])

## 2020-04-12 NOTE — Progress Notes (Signed)
* * *        [name] [name]**    --- ---    50 Y old Female, DOB: 1970-11-16, External MRN: [id number]    Account Number: [id number]    [address], [place], [id number]    Home: [phone]    Insurance: [employer] [place]    PCP: Tonny Branch, MD Referring: Tonny Branch, MD    Appointment Facility: Gynecology        * * *    03/01/2016  Toya Smothers, MD **CHN#:** [id number]    --- ---    ---        Reason for Appointment    ---      1\. CONSULT-H/O FIBROIDS ENDOMETRIOSIS    ---      History of Present Illness    ---     _GENERAL_ :    50yo G0 here to discuss her history of fibroids and heavy bleeding. She has  known fibroids and last ultrasound in 10/2015 that showed:    FINDINGS:    The uterus measures 20.4 x 8.7 x 10.3 (cm Sagittal x cm AP x cm TV) the uterus  is lobulated in contour and heterogeneous in echotexture. There are multiple  uterine fibroids with a large partially exophytic subserosal fibroid arising  from the uterine fundus measuring 11.0 x 11.1 x 4.2 cm. The endometrium is not  clearly visualized as it is obscured by the uterine fibroids..    The right ovary measures 3.4 x 1.4 x 2.8 (cm Sagittal x cm AP x cm TV) . The  left ovary measures 2.5 x 1.9 x 3.1 (cm Sagittal x cm AP x cm TV). The ovaries  are normal in appearance. The left and right adnexal regions are unremarkable.    There is no free pelvic fluid.    IMPRESSION:    Heterogeneous lobulated uterus with multiple uterine fibroids, the largest a  11.0 cm and exophytic subserosal fibroid arising from the uterine fundus.    She had been considering surgery: myomectomy vs. hysterectomy. Her sister is  an OB/GYN in [place] and has recommended a hysterectomy. The patient is  somewhat reluctant to having the uterus out, even though she does not desire  future child bearing.    Her periods last 7 days and are monthly but accompanied by very heavy bleeding  for the first 3 days requiring bringing a change of clothes with her. She  has  had anemia in the past with Hb as low as 8 but has had 2 iron transfusions  with the last Hb being 11.5. She has tried OCPs in the past, but they just  caused persistent bleeding. She has not tried other hormonal medication or an  IUD. She has never had an EMB.      Current Medications    ---    Taking     * Centrum - Tablet Orally     ---    * iron 1 tab Oral     ---    Not-Taking/PRN    * None     ---      Past Medical History    ---       Uterine fibroids.        ---    Obesity.        ---    Fibrocystic breast disease.        ---    Umbilical hernia.        ---  Back pain.        ---      Surgical History    ---      Denies Past Surgical History    ---      Family History    ---      Non-Contributory    ---      Social History    ---    Tobacco    history: _Never smoked_    Alcohol    _Denies_       Allergies    ---      penicillin : hives,swelling: Allergy    ---    choroquine: hives, inbalance: Allergy    ---      Hospitalization/Major Diagnostic Procedure    ---      Denies Past Hospitalization    ---      Review of Systems    ---     _OB/GYN ROS_ :    General no fevers, no weakness, no memory loss, no swollen glands, no  bruising, no weight loss, no constant fatigue, no physical abuse, no emotional  abuse. HEENT no visual problems, no eye pain, no ear pain, no difficulty  hearing, no headaches, no sinus trouble. Skin no changing moles, no rash, no  jaundice. Cardiovascular no chest pains, no palpitations, no swelling, no  dizziness, no murmurs. Respiratory no shortness of breath, no cough, no  wheezing. GI no nausea, no vomiting, no loss of appetite, no constipation, no  diarrhea. Urinary no painful urination, no leakage of urine, no frequent  urination, no blood in urine. Musculoskeletal no joint swelling, no joint  stiffness. Breast no breast lumps, no breast pain, no nipple discharge. Psych  no depression, no anxiety.          Vital Signs    ---    Pain scale 0, Ht-in 65, Wt-lbs 223.8, BMI 37.24,  BP 119/79.      Assessments    ---    1\. Intramural leiomyoma of uterus - D25.1 (Primary)    ---    2\. 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 Subserosal leiomyoma of uterus - D25.2    ---      50yo G0 here to discuss definitive management of her fibroids. Discussed  surgical options of myomectomy vs. hysterectomy. discussed the r/b of each. In  most patients with fibroids who do not desire children, we would recommended  hysterectomy due to the fact that we can't always remove all of the fibroids  and the increased risk of blood loss with the surgery. Sometimes we can put  the patient on Lupron prior to surgery to help with the bleeding and shrink  the size of the fibroids if a myomectomy is desired. She is really unsure of  what she wants and would like to speak with her sister. I also would like to  review the case with one of my partners to discuss surgical approaches  (laparoscopic vs. abdominal). I will call her next week to come up with a  plan. If she chooses a hysterectomy, we will have her come in for an EMB and  preop paperwork at the same time. She would definitely like surgery as quickly  as possible.    ---      Follow Up    ---    We will schedule going forward    Electronically signed by Toya Smothers MD on 03/01/2016 at 11:39 AM EST  Sign off status: Completed        * * *        Gynecology    [address]    [place], [place] [address]    Tel: [phone]    Fax: [phone]              * * *          Patient: [name] [name], [name] [name] DOB: 1970/12/31 Progress Note: Toya Smothers, MD  03/01/2016    ---    Note generated by eClinicalWorks EMR/PM Software ([email or web address])

## 2020-04-29 ENCOUNTER — Other Ambulatory Visit

## 2020-04-29 ENCOUNTER — Inpatient Hospital Stay: Admit: 2020-04-29 | Payer: PRIVATE HEALTH INSURANCE | Primary: Internal Medicine

## 2020-04-29 DIAGNOSIS — N6001 Solitary cyst of right breast: Secondary | ICD-10-CM

## 2020-04-29 HISTORY — PX: BI US RIGHT BREAST CYST ASPIRATION: IMG1952

## 2020-05-02 ENCOUNTER — Encounter (HOSPITAL_BASED_OUTPATIENT_CLINIC_OR_DEPARTMENT_OTHER)

## 2020-05-09 ENCOUNTER — Telehealth (HOSPITAL_BASED_OUTPATIENT_CLINIC_OR_DEPARTMENT_OTHER): Admitting: Clinical

## 2020-05-09 NOTE — Progress Notes (Deleted)
Medical Weight Loss Behavioral Health Visit     General       This visit was conducted via telehealth using both video and audio technology for 30 minutes. Patient at home, provider at home, no other participants. Patient and provider are located in [place].       Patient is fluent in [place], no interpreter necessary. (Or *** speaking with interpreter used for this visit).      Progress Review       ? Overall progress: Patient continues to make the necessary lifestyle changes to improve overall wellness.     ? Eating behaviors:     ? Food logging:     ? Physical activity:     ? Caffeine:     ? Sugary drinks:     ? Nicotine:     ? Alcohol:    ? THC:    ? Emotional health & Body image:     ? Social/Relational health:     ? Outside behavioral health support:        Presentation       ? Mood: euthymic     ? Affect: full, appropriate     ? Behavior: friendly     ? Participation: on time, stayed throughout, involved

## 2020-05-16 ENCOUNTER — Telehealth (HOSPITAL_BASED_OUTPATIENT_CLINIC_OR_DEPARTMENT_OTHER): Admitting: Internal Medicine

## 2020-05-16 NOTE — Addendum Note (Signed)
Addended by: Blake Divine on: 05/16/2020 02:35 PM     Modules accepted: Orders

## 2020-05-16 NOTE — Telephone Encounter (Deleted)
-----   Message from [name], MD sent at 04/29/2020  4:47 PM EDT -----    ----- Message -----  From: Interface, Radiology Results In  Sent: 04/29/2020   4:26 PM EDT  To: [name], MD

## 2020-05-16 NOTE — Telephone Encounter (Addendum)
I called the patient to discuss her breast ultrasound results. She states she is relieved that the results are benign and agreeable to a mammogram in one year.    [name] states she is trying to find a therapist and continues to work on her diet and exercise.     She has a planned appointment [facility].    She will follow up in [facility] in June/July for repeat Hep B testing.       ----- Message from [name], MD sent at 04/29/2020  4:47 PM EDT -----    ----- Message -----  From: Interface, Radiology Results In  Sent: 04/29/2020   4:26 PM EDT  To: [name], MD

## 2020-05-19 ENCOUNTER — Encounter (HOSPITAL_BASED_OUTPATIENT_CLINIC_OR_DEPARTMENT_OTHER)

## 2020-05-19 ENCOUNTER — Telehealth (HOSPITAL_BASED_OUTPATIENT_CLINIC_OR_DEPARTMENT_OTHER): Admitting: Registered"

## 2020-07-12 ENCOUNTER — Other Ambulatory Visit (HOSPITAL_BASED_OUTPATIENT_CLINIC_OR_DEPARTMENT_OTHER)

## 2020-07-12 ENCOUNTER — Ambulatory Visit: Payer: PRIVATE HEALTH INSURANCE | Attending: Internal Medicine | Primary: Internal Medicine

## 2020-07-12 ENCOUNTER — Telehealth (HOSPITAL_BASED_OUTPATIENT_CLINIC_OR_DEPARTMENT_OTHER): Admitting: Internal Medicine

## 2020-07-12 NOTE — Telephone Encounter (Signed)
Patient called in asking if it is ok to walk in for Blood work? cb pt

## 2020-07-12 NOTE — Progress Notes (Signed)
Created in error

## 2020-07-12 NOTE — Progress Notes (Deleted)
[facility]  [address]  [address] [place] [address]  Dept: [phone]  Dept Fax: [phone]     Patient ID: [name] [name] is a 50 y.o. female who presents for No chief complaint on file..    Assessment/Plan   There are no diagnoses linked to this encounter.    Subjective   HPI    {Prob/Med/Allergy/Histories/ROS (Optional):30015}    Objective    There were no vitals taken for this visit.    Physical Exam

## 2020-07-13 NOTE — Telephone Encounter (Signed)
TC to pt:  Pt just returned from traveling and is requesting labs be ordered to check her liver function due to chronic Hep B.  Pt wants to come in this week.  Thanks.  Meyer Russel, RN, 07/12/2020, 8:52 AM

## 2020-07-13 NOTE — Telephone Encounter (Signed)
 Brad, please let patient know she can wait for her appointment with Colman Cater on 7/19 to determine what blood work needs to be done. Elene will review the blood work after the visit and call patient if there are any issues. No need for urgent care appointment for Hep B blood work.    Thanks!  Belenda Cruise

## 2020-08-02 ENCOUNTER — Ambulatory Visit: Payer: PRIVATE HEALTH INSURANCE | Attending: Adult Health | Primary: Internal Medicine

## 2020-08-08 ENCOUNTER — Other Ambulatory Visit

## 2020-08-10 ENCOUNTER — Ambulatory Visit: Payer: PRIVATE HEALTH INSURANCE | Attending: Adult Health | Primary: Internal Medicine

## 2020-08-10 ENCOUNTER — Encounter

## 2020-08-10 ENCOUNTER — Encounter (HOSPITAL_BASED_OUTPATIENT_CLINIC_OR_DEPARTMENT_OTHER)

## 2020-08-10 NOTE — Progress Notes (Deleted)
 [facility]  [address]  [address] [place] [address]  Dept: [phone]  Dept Fax: [phone]     Patient ID: [name] [name] is a 50 y.o. female who presents for No chief complaint on file..      Last seen by dr. Jonna Clark 3/22/2022Assessment & Plan:   [phone] with OSA on CPAP, migraine, obesity, depression.    # Depression, recurrent, moderate: PHQ-9 score of 11 (03/2020).  -Pt not interested in medication at this time, no SI  -Pt interested in therapy (she specifically wants CBT), she has used [email or web address] and hasn't been able to find a CBT-specialist who is taking new patients, she will get on some waitlists  -Overall, pt feels on the upswing with the end of winter, really feels like she's doing better, she will reach out if she needs additional support    Subjective   HPI    {Prob/Med/Allergy/Histories/ROS (Optional):30015}    Objective   There were no vitals taken for this visit.    Physical Exam    Assessment/Plan   There are no diagnoses linked to this encounter.

## 2020-08-18 ENCOUNTER — Telehealth (HOSPITAL_BASED_OUTPATIENT_CLINIC_OR_DEPARTMENT_OTHER): Admitting: Internal Medicine

## 2020-08-18 ENCOUNTER — Ambulatory Visit: Payer: PRIVATE HEALTH INSURANCE | Attending: Internal Medicine | Primary: Internal Medicine

## 2020-08-18 ENCOUNTER — Encounter

## 2020-08-18 NOTE — Telephone Encounter (Signed)
 Pt is calling in regards to her appointment today with Dr.Ravipati. She had to cancel her appointment due to car trouble and would like if someone can call her back to book a sooner time for her.     Please call pt back in regards to this matter.     Thank you so much.

## 2020-08-19 NOTE — Telephone Encounter (Signed)
 Called pt and rescheduled appt to 8/22. Thank you!  [name]

## 2020-09-05 ENCOUNTER — Ambulatory Visit: Admit: 2020-09-05 | Discharge: 2020-09-05 | Payer: PRIVATE HEALTH INSURANCE | Primary: Internal Medicine

## 2020-09-05 ENCOUNTER — Encounter

## 2020-09-05 ENCOUNTER — Other Ambulatory Visit

## 2020-09-05 ENCOUNTER — Encounter (HOSPITAL_BASED_OUTPATIENT_CLINIC_OR_DEPARTMENT_OTHER)

## 2020-09-05 ENCOUNTER — Other Ambulatory Visit: Admit: 2020-09-05 | Payer: PRIVATE HEALTH INSURANCE | Primary: Internal Medicine

## 2020-09-05 VITALS — BP 115/78 | HR 63 | Temp 97.4°F | Ht 64.49 in | Wt 238.0 lb

## 2020-09-05 DIAGNOSIS — B181 Chronic viral hepatitis B without delta-agent: Secondary | ICD-10-CM

## 2020-09-05 LAB — HEPATITIS B SURFACE ANTIGEN CONFIRMATION: Hepatitis B surface antigen confirmation: POSITIVE — AB

## 2020-09-05 LAB — HEPATITIS B SURFACE ANTIGEN

## 2020-09-05 NOTE — Progress Notes (Signed)
 [facility]  [address]  [address]  [place] [address]  Dept: [phone]  Dept Fax: [phone]     Patient ID: [name] [name] is a 50 y.o. female who presents for Follow-up. 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 No jaundice, no pruritus   Weight unchanged, struggling to loose weight, was introduced to wieght management during last visits   Not currently SA  Does not drink alcohol     Current Outpatient Medications   Medication Instructions   . GENERIC EXTERNAL MEDICATION No dose, route, or frequency recorded.   . sertraline (Zoloft) 25 mg tablet oral   . SUMAtriptan (Imitrex) 100 mg tablet No dose, route, or frequency recorded.   . SUMAtriptan (Imitrex) 50 mg tablet oral     Allergies   Allergen Reactions   . Chloroquine Phosphate      Other reaction(s): hives, imbalance, tinnits   . Ferric Carboxymaltose Hives   . Penicillin G Benzathine      Other reaction(s): hives, swelling   . Chloroquine Headache, Hives, Rash and Tinnitus       Objective   Visit Vitals  BP 115/78   Pulse 63   Temp 36.3 C (97.4 F) (Oral)   Ht 1.638 m   Wt 108 kg   BMI 40.24 kg/m   BSA 2.22 m       Physical Exam     Abdomen: protuberant and obese   Could not assess liver span by percussion given obesity   Non tender to palpation all quadrants    HEENT: no scleral icterus identified     Assessment/Plan   [name] [name] was seen today for follow-up.  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 coma (CMS/HCC)  Comments:  - Stable and asx  - FUP on hep B viral load and hep b surf Ag   - Add on LFTs if able   - fup [place] for HCC surveillance (last 2020)  Orders:  -     US ABDOMEN COMPLETE; Future  Chronic right-sided back pain, unspecified back location  Comments:  suspecting MSK   - PT  referral  Orders:  -     EXT  Physical Therapy Referral; Future

## 2020-09-06 ENCOUNTER — Encounter (HOSPITAL_BASED_OUTPATIENT_CLINIC_OR_DEPARTMENT_OTHER)

## 2020-09-06 LAB — HEPATITIS B VIRUS DNA, QUANTITATIVE, RT-TMA
Hepatitis B DNA Viral Load, IU/mL: 2901 [IU]/mL — ABNORMAL HIGH
Hepatitis B DNA Viral Load, Interp: DETECTED — AB
Hepatitis B DNA Viral Load, copies/mL: 9892 {copies}/mL — ABNORMAL HIGH
Hepatitis B DNA Viral Load, log IU/mL: 3.46 {Log_IU}/mL — ABNORMAL HIGH
Hepatitis B DNA Viral Load, log copies/mL: 3.99 {Log_copies}/mL — ABNORMAL HIGH

## 2020-09-12 ENCOUNTER — Other Ambulatory Visit (HOSPITAL_BASED_OUTPATIENT_CLINIC_OR_DEPARTMENT_OTHER)

## 2020-09-12 LAB — ALBUMIN: Albumin: 5.1 g/dL — ABNORMAL HIGH (ref 3.2–5.0)

## 2020-09-12 LAB — AST: AST: 50 U/L — ABNORMAL HIGH (ref 6–42)

## 2020-09-12 LAB — BILIRUBIN, TOTAL: Bilirubin, total: 0.6 mg/dL (ref 0.2–1.2)

## 2020-09-12 LAB — ALT: ALT: 35 U/L (ref 0–55)

## 2020-09-12 LAB — ALKALINE PHOSPHATASE: Alkaline phosphatase: 101 U/L (ref 30–130)

## 2020-09-12 LAB — BILIRUBIN, DIRECT: Bilirubin, direct: 0.3 mg/dL (ref 0.0–0.5)

## 2020-09-12 LAB — PROTEIN, TOTAL: Protein, total: 9.8 g/dL — ABNORMAL HIGH (ref 6.0–8.4)

## 2020-09-14 ENCOUNTER — Encounter (HOSPITAL_BASED_OUTPATIENT_CLINIC_OR_DEPARTMENT_OTHER): Admitting: Physician Assistant

## 2020-09-29 ENCOUNTER — Telehealth (HOSPITAL_BASED_OUTPATIENT_CLINIC_OR_DEPARTMENT_OTHER): Admitting: Internal Medicine

## 2020-09-29 NOTE — Telephone Encounter (Signed)
 External PT referral was entered by Dr. Axel Filler.  I have gathered all the info from [facility] and [facility].    FAO#[id number]  [name] [name]  NPI# [id number]  [facility]  [address]  [place], [place].   Fax# [phone]  Phone# [phone]    Hi, I just got the above information from the PT facility.  Patient appointment is tomorrow 9/16@11 :30am.     The last user on this referral order is [employer].    Thanks,  Boi-Linh

## 2020-09-29 NOTE — Telephone Encounter (Signed)
 [place] from [facility] called stating she is still waiting on a call back regarding authorization # and amount of visits approved. Pt has an appt tomorrow at 11:30. [place] would like a call back with info. Thanks

## 2020-09-30 ENCOUNTER — Encounter: Admitting: Internal Medicine

## 2020-09-30 ENCOUNTER — Telehealth (HOSPITAL_BASED_OUTPATIENT_CLINIC_OR_DEPARTMENT_OTHER): Admitting: Internal Medicine

## 2020-09-30 ENCOUNTER — Encounter

## 2020-09-30 NOTE — Telephone Encounter (Signed)
 I called the patient and she did not answer.    In reviewing her latest hepatitis B studies- she has been HbeAg- but her HBV DNA viral loaod is now >2,000. Given her normal ALT, I will order a FibroTest and request a follow up with [facility]. She was previously seen by [facility] for chronic hep B.     She also has a RUQ u/s pending.

## 2020-09-30 NOTE — Telephone Encounter (Signed)
 Referral Team had faxed over the referral approval to the PT office.  External referral all set for today appt.

## 2020-11-28 ENCOUNTER — Ambulatory Visit: Payer: PRIVATE HEALTH INSURANCE | Attending: Infectious Disease | Primary: Internal Medicine

## 2020-11-28 ENCOUNTER — Encounter (HOSPITAL_BASED_OUTPATIENT_CLINIC_OR_DEPARTMENT_OTHER)

## 2020-11-28 ENCOUNTER — Encounter

## 2020-12-14 ENCOUNTER — Encounter (HOSPITAL_BASED_OUTPATIENT_CLINIC_OR_DEPARTMENT_OTHER)

## 2020-12-14 ENCOUNTER — Ambulatory Visit
Payer: PRIVATE HEALTH INSURANCE | Attending: Student in an Organized Health Care Education/Training Program | Primary: Internal Medicine

## 2020-12-21 ENCOUNTER — Other Ambulatory Visit

## 2020-12-22 ENCOUNTER — Telehealth (HOSPITAL_BASED_OUTPATIENT_CLINIC_OR_DEPARTMENT_OTHER): Admitting: Internal Medicine

## 2020-12-22 ENCOUNTER — Other Ambulatory Visit

## 2020-12-22 NOTE — Telephone Encounter (Signed)
 Not on pt's med list, not on pt's dispensed Hx    Called pt, no answer, Ronda Fairly, RN  12/22/2020  12:56 PM

## 2020-12-22 NOTE — Telephone Encounter (Signed)
 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

## 2020-12-22 NOTE — Telephone Encounter (Signed)
 Pt would like to have a script for estrogen patch    Please send to   [facility]  [address] [place], [place] [address]  [phone]    Thanks.

## 2020-12-22 NOTE — Telephone Encounter (Signed)
 Pt would like to call back regarding if she can do the Liver Fibrosis blood draw & liver ultrasound on 12/14     Please cb. Thanks.   [phone]

## 2020-12-23 NOTE — Telephone Encounter (Signed)
TC to pt:  Pt has 12/14 appt scheduled with [facility].  Explained that [facility] will order labs and [place] for pt.  Pt is also c/o severe menopausal s/s and would like an UC appt scheduled for eval and tx.  Meyer Russel, RN, 12/22/2020, 10:09 AM

## 2020-12-23 NOTE — Telephone Encounter (Signed)
 Pt is requesting a call back from [place] asap

## 2020-12-26 ENCOUNTER — Ambulatory Visit: Payer: PRIVATE HEALTH INSURANCE | Attending: Infectious Disease | Primary: Internal Medicine

## 2020-12-28 ENCOUNTER — Encounter (HOSPITAL_BASED_OUTPATIENT_CLINIC_OR_DEPARTMENT_OTHER)

## 2020-12-28 ENCOUNTER — Encounter

## 2020-12-28 ENCOUNTER — Ambulatory Visit
Admit: 2020-12-28 | Discharge: 2020-12-28 | Payer: PRIVATE HEALTH INSURANCE | Attending: Physician Assistant | Primary: Internal Medicine

## 2020-12-28 ENCOUNTER — Other Ambulatory Visit

## 2020-12-28 ENCOUNTER — Institutional Professional Consult (permissible substitution)
Admit: 2020-12-28 | Discharge: 2020-12-28 | Payer: PRIVATE HEALTH INSURANCE | Attending: Student in an Organized Health Care Education/Training Program | Primary: Internal Medicine

## 2020-12-28 ENCOUNTER — Encounter (HOSPITAL_BASED_OUTPATIENT_CLINIC_OR_DEPARTMENT_OTHER): Admitting: Internal Medicine

## 2020-12-28 VITALS — BP 130/88 | HR 75 | Temp 97.4°F | Ht 65.0 in | Wt 255.0 lb

## 2020-12-28 VITALS — BP 128/84 | HR 79 | Temp 97.2°F | Resp 16 | Ht 65.0 in | Wt 256.2 lb

## 2020-12-28 DIAGNOSIS — N951 Menopausal and female climacteric states: Secondary | ICD-10-CM

## 2020-12-28 DIAGNOSIS — B181 Chronic viral hepatitis B without delta-agent: Secondary | ICD-10-CM

## 2020-12-28 LAB — HEPATITIS C ANTIBODY: Hepatitis C antibody: NONREACTIVE

## 2020-12-28 MED ORDER — estradiol (Climara) 0.025 mg/24 hr
0.025 | MEDICATED_PATCH | TRANSDERMAL | 1 refills | Status: DC
Start: 2020-12-28 — End: 2021-01-02

## 2020-12-28 NOTE — Assessment & Plan Note (Signed)
 PE exam with xerosis cutis in bilateral feet.   - Avoid excessive or aggressive skin washing with hot water (multiple times per day). Lukewarm/warm water recommended. Avoid aggressive scrubbing.   - Advised that using hot water, prolonged bathing and regular use of soaps worsen xerosis.   - Recommended syndet cleanser/mild cleanser (lower pH similar to acidic pH of skin).  - Advised daily moisturizers/ointments such as aquaphor, eucerin, cetaphil, cerabe, or bioderma cream. Pat dry (gentle drying) and apply emollients right after bathing.   - Can use creams that contain urea, lactic acid, ammonium lactate, or alpha hydroxy acids.

## 2020-12-28 NOTE — Progress Notes (Signed)
 [facility]  [address] [place] [address]  Dept: [phone]  Dept Fax: [phone]  MRN: [id number]   TIME: 3:42 PM  LOCATION: [facility]  PCP: Blake Divine, MD    CHIEF COMPLAINT   Night Sweats and Menopause.    Subjective   HISTORY OF PRESENT ILLNESS   Patient ID:  [name] [name] is a 50 y.o. female problem list includes OSA, migraine without aura, BMI 42, macromastia, presbyopia, chronic viral hepatitis B who presents for Night Sweats and Menopause.    History of Presenting Illness:  #Hot Flashes with Menopause  -Presents today with concern for menopause symptoms, admits to migraines, headaches, and feeling sweaty. The night sweats are particularly bothersome, states sometimes she feels like she is a different person. Endorses that night sweats have worsened in the past 2 months, now feels like she drenching the bed at night. Symptoms are impacting her quality of life and ability to do her job.   - History notable for headaches, on sumatriptan 50 mg.   - Friend is an OB/GYN who told her that she isprobably going through menopause and would benefit from hormone replacement therapy. She is most interested in starting the transdermal patch.  - Completed a hysterectomy in 2017. No period since hysterectomy in 2017.   - Does not drink alcohol.     #Obstructive Sleep Apnea  - Sleep hygiene: Referred for migraines and sleep. Wakes up 1 time a night (+) snoring, (+) apneic episodes. Typical symptoms of sleep apnea, recommended diagnostic testing to rule out OSA and sleep poylsomnogram. Sleep medicine 03/2020 recommended follow up in 2 months.   - Has a CPAP machine at home, continues to wake up foggy and tired. Does not use it as often anymore, concerned that the flow of the CPAP settings are incorrect. Had a period where she lost her CPAP machine last year.   - Symptoms associated with headache and feelings of decreased focus.    #Concern for Weight Gain  -  Pretty sedentary, sits 10 hours a day at work.   - Planning on starting a new regimen with her sister, thinking of increasing proteins and vegetables. Likes vegetables. Does not eat in frequent intervals so then when she finally eats, she often overeats.  - Consumes a lot of nuts throughout the day, and sometimes eats them as meals.   - Didn't feel like the provider was listening to her at the [facility], but interested trying to establish with another provider.    Review of Systems   Constitutional: Positive for diaphoresis. Negative for activity change, appetite change, chills, fatigue, fever and unexpected weight change.   Respiratory: Negative for chest tightness and shortness of breath.    Cardiovascular: Negative for chest pain.   Gastrointestinal: Negative for diarrhea, nausea and vomiting.   Neurological: Positive for headaches. Negative for dizziness, weakness, light-headedness and numbness.       PROBLEM LIST     Patient Active Problem List   Diagnosis   . Migraine without aura and without status migrainosus, not intractable   . Major depressive disorder, single episode, unspecified   . 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 coma (CMS/HCC)   . BMI 40.0-44.9, adult (CMS/HCC)   . Obstructive sleep apnea (adult) (pediatric)   . Chronic right-sided back pain   . Patellofemoral pain syndrome of right knee   . Muscle spasm   . Pain in thoracic  spine   . Macromastia   . Presbyopia   . Hot flashes due to menopause   . Xerosis cutis       HOME MEDICATIONS     Current Outpatient Medications   Medication Instructions   . estradiol (Climara) 0.025 mg/24 hr 1 patch, transdermal, Weekly   . SUMAtriptan (Imitrex) 50 mg tablet oral        ALLERGIES     Allergies   Allergen Reactions   . Chloroquine Phosphate      Other reaction(s): hives, imbalance, tinnits   . Ferric Carboxymaltose Hives   . Penicillin G Benzathine      Other reaction(s): hives, swelling   . Chloroquine Headache, Hives,  Rash and Tinnitus        SOCIAL HISTORY     Social History     Social History Narrative   . Not on file     Social History     Tobacco Use   . Smoking status: Not on file   . Smokeless tobacco: Not on file   Substance Use Topics   . Alcohol use: Not on file   . Drug use: Not on file       Objective    PHYSICAL EXAM   Vitals:   Vitals:    12/28/20 1031 12/28/20 1055   BP: 130/88 (P) 124/68   BP Location:  (P) Right arm   Patient Position:  (P) Sitting   BP Cuff Size:  (P) Thigh   Pulse: 75    Temp: 36.3 C (97.4 F)    TempSrc: Oral    Weight: 116 kg    Height: 1.651 m       Physical Exam  Vitals and nursing note reviewed.   Constitutional:       General: She is awake. She is not in acute distress.     Appearance: Normal appearance. She is obese. She is not ill-appearing, toxic-appearing or diaphoretic.      Interventions: Face mask in place.   HENT:      Head: Normocephalic and atraumatic.      Nose: Nose normal.      Mouth/Throat:      Mouth: Mucous membranes are moist.   Eyes:      General: No scleral icterus.        Right eye: No discharge.         Left eye: No discharge.      Conjunctiva/sclera: Conjunctivae normal.   Cardiovascular:      Rate and Rhythm: Normal rate and regular rhythm.      Pulses: Normal pulses. No decreased pulses.           Radial pulses are 2+ on the right side and 2+ on the left side.        Posterior tibial pulses are 2+ on the right side and 2+ on the left side.      Heart sounds: Normal heart sounds, S1 normal and S2 normal. No murmur heard.    No friction rub. No gallop.   Pulmonary:      Effort: Pulmonary effort is normal. No respiratory distress.      Breath sounds: Normal breath sounds. No stridor. No wheezing, rhonchi or rales.   Abdominal:      Palpations: Abdomen is soft.   Musculoskeletal:         General: Normal range of motion.      Cervical back: Normal range of motion and neck supple.  Right lower leg: No edema.      Left lower leg: No edema.   Feet:      Right foot:       Skin integrity: Dry skin present. No ulcer, blister, skin breakdown or erythema.      Left foot:      Skin integrity: Dry skin present. No ulcer, blister, skin breakdown or erythema.   Skin:     General: Skin is warm.      Capillary Refill: Capillary refill takes less than 2 seconds.      Coloration: Skin is not jaundiced.   Neurological:      General: No focal deficit present.      Mental Status: She is alert. Mental status is at baseline.   Psychiatric:         Attention and Perception: Attention and perception normal.         Mood and Affect: Mood and affect normal.         Speech: Speech normal.         Behavior: Behavior normal. Behavior is cooperative.         Thought Content: Thought content normal.         Judgment: Judgment normal.          DATA & RESULTS   Recent Labs/imaging reviewed  Lab Results   Component Value Date    ALT 35 09/05/2020    AST 50 (H) 09/05/2020          Assessment/Plan    ASSESSMENT & PLAN   Crystina CLYDIE DILLEN is a 50 y.o. female problem list includes OSA, migraine without aura, BMI 42, macromastia, presbyopia, chronic viral hepatitis B who presents for Headache and Night Sweats.    Sarayah was seen today for night sweats and menopause.  Hot flashes due to menopause  Assessment & Plan:   Hx of hyterectomy in 2017. Admits to sx of hot flashes/flushes associated with menopause.   - Discussed average age of menopause is 69 years (may undergo 2 years earlier). Reviewed menopausal stages (premenopause, monpausal transition, postmenopause) expressed hot flashes present in up to 80% of those in menopausal transition.   - Discussed behavioral modifications for vasomotor symptoms (dress in layers, decrease ambient temperature, patient does not drink alcohol, health diet/exercise). Encourgaged yoga, minduflness, CBT.  - Patient interested in hormone replacement therapy for moderate-severe symptoms. If HRT ineffective may consider SSRI/SNIR (I.e. paroextine), then gabapentin/pregablin. No  contraindications to estrogen therpy (hx or high risk for CVD, breast/uterinie CA, DVT, acutre liver disease). Reviewed side effect profile (increased stroke risk, uterine bleeding, breast tenderness, risk of VTE).  - Will prescribe lowest effective dose & attempt to discontinue taper q6-12 months (gradual taper).   - Given patient in late 50s-50s with classic symptoms no role for labs--LH & FSH not routine as both may be normal during menopausal transition.   - Will star patient on climara 0.025 weekly x 4 weeks. Advised patient that she can shower with the patch, apply to upper abdomen, rotate placement.   - Will refer patient to OB/GYN for continued monitoring and follow up.   Orders:  -     estradiol (Climara) 0.025 mg/24 hr; Place 1 patch on the skin 1 (one) time per week.  -     INT  [facility] Gynecology (T GEN GYN); Future  Xerosis cutis  Assessment & Plan:  PE exam with xerosis cutis in bilateral feet.   - Avoid excessive or  aggressive skin washing with hot water (multiple times per day). Lukewarm/warm water recommended. Avoid aggressive scrubbing.   - Advised that using hot water, prolonged bathing and regular use of soaps worsen xerosis.   - Recommended syndet cleanser/mild cleanser (lower pH similar to acidic pH of skin).  - Advised daily moisturizers/ointments such as aquaphor, eucerin, cetaphil, cerabe, or bioderma cream. Pat dry (gentle drying) and apply emollients right after bathing.   - Can use creams that contain urea, lactic acid, ammonium lactate, or alpha hydroxy acids.   Obstructive sleep apnea (adult) (pediatric)  -     INT  [facility] Sleep [facility] (T SLEEP LAB); Future  BMI 40.0-44.9, adult (CMS/HCC)  Assessment & Plan:  - Did not connect with provider at [facility], stopped seeing provider. Will provide her with external referral.   Orders:  -     EXT  Nutrition Services Referral; Future     Questions/concerns or new/worsening sx to contact the  office.  Return to clinic for annual exam in the next 3 months with PCP.    A total of 45 minutes was spent on this encounter, reviewing previous records, performing the exam, counseling/education of the patient regarding treatment plans and clinical documentation into the EMR.  ____________________________________  Cathrine Muster, PA   12/28/20, 3:42 PM

## 2020-12-28 NOTE — Other (Signed)
 Patient Education   Table of Contents       Menopause     To view videos and all your education online visit,   https://pe.elsevier.com/[email or web address]   or scan this QR code with your smartphone.                    Menopause     Menopause is the normal time of a woman's life when menstrual periods stop completely. It marks the natural end to a woman's ability to become pregnant. It can be defined as the absence of a menstrual period for 12 months without another medical cause. The transition to menopause (perimenopause) most often happens between the ages of 52 and 31, and can last for many years. During perimenopause, hormone levels change in your body, which can cause symptoms and affect your health. Menopause may increase your risk for:       Weakened bones (osteoporosis), which causes fractures.       Depression.       Hardening and narrowing of the arteries (atherosclerosis), which can cause heart attacks and strokes.     What are the causes?    This condition is usually caused by a natural change in hormone levels that happens as you get older. The condition may also be caused by changes that are not natural, including:       Surgery to remove both ovaries (surgical menopause).       Side effects from some medicines, such as chemotherapy used to treat cancer (chemical menopause).     What increases the risk?    This condition is more likely to start at an earlier age if you have certain medical conditions or have undergone treatments, including:       A tumor of the pituitary gland in the brain.       A disease that affects the ovaries and hormones.       Certain cancer treatments, such as chemotherapy or hormone therapy, or radiation therapy on the pelvis.       Heavy smoking and excessive alcohol use.       Family history of early menopause.     This condition is also more likely to develop earlier in women who are very thin.   What are the signs or symptoms?    Symptoms of this condition include:       Hot  flashes.       Irregular menstrual periods.       Night sweats.       Changes in feelings about sex. This could be a decrease in sex drive or an increased discomfort around your sexuality.       Vaginal dryness and thinning of the vaginal walls. This may cause painful sex.       Dryness of the skin and development of wrinkles.       Headaches.       Problems sleeping (insomnia).       Mood swings or irritability.       Memory problems.       Weight gain.       Hair growth on the face and chest.       Bladder infections or problems with urinating.     How is this diagnosed?   This condition is diagnosed based on your medical history, a physical exam, your age, your menstrual history, and your symptoms. Hormone tests may also be done.   How is this treated?  In some cases, no treatment is needed. You and your health care provider should make a decision together about whether treatment is necessary. Treatment will be based on your individual condition and preferences. Treatment for this condition focuses on managing symptoms. Treatment may include:       Menopausal hormone therapy (MHT).       Medicines to treat specific symptoms or complications.       Acupuncture.       Vitamin or herbal supplements.      Before starting treatment, make sure to let your health care provider know if you have a personal or family history of these conditions:       Heart disease.       Breast cancer.       Blood clots.       Diabetes.       Osteoporosis.     Follow these instructions at home:   Lifestyle        Do not  use any products that contain nicotine or tobacco, such as cigarettes, e-cigarettes, and chewing tobacco. If you need help quitting, ask your health care provider.       Get at least 30 minutes of physical activity on 5 or more days each week.       Avoid alcoholic and caffeinated beverages, as well as spicy foods. This may help prevent hot flashes.       Get 7?8 hours of sleep each night.      If you have hot flashes,  try:       Dressing in layers.       Avoiding things that may trigger hot flashes, such as spicy food, warm places, or stress.       Taking slow, deep breaths when a hot flash starts.       Keeping a fan in your home and office.       Find ways to manage stress, such as deep breathing, meditation, or journaling.       Consider going to group therapy with other women who are having menopause symptoms. Ask your health care provider about recommended group therapy meetings.     Eating and drinking            Eat a healthy, balanced diet that contains whole grains, lean protein, low-fat dairy, and plenty of fruits and vegetables.       Your health care provider may recommend adding more soy to your diet. Foods that contain soy include tofu, tempeh, and soy milk.       Eat plenty of foods that contain calcium and vitamin D for bone health. Items that are rich in calcium include low-fat milk, yogurt, beans, almonds, sardines, broccoli, and kale.     Medicines         Take over-the-counter and prescription medicines only as told by your health care provider.       Talk with your health care provider before starting any herbal supplements. If prescribed, take vitamins and supplements as told by your health care provider.     General instructions           Keep track of your menstrual periods, including:       When they occur.       How heavy they are and how long they last.       How much time passes between periods.       Keep track of your symptoms, noting when they  start, how often you have them, and how long they last.       Use vaginal lubricants or moisturizers to help with vaginal dryness and improve comfort during sex.       Keep all follow-up visits. This is important. This includes any group therapy or counseling.         Contact a health care provider if:         You are still having menstrual periods after age 27.       You have pain during sex.       You have not had a period for 12 months and you develop  vaginal bleeding.     Get help right away if you have:         Severe depression.       Excessive vaginal bleeding.       Pain when you urinate.       A fast or irregular heartbeat (palpitations).       Severe headaches.       Abdominal pain or severe indigestion.     Summary         Menopause is a normal time of life when menstrual periods stop completely. It is usually defined as the absence of a menstrual period for 12 months without another medical cause.       The transition to menopause (perimenopause) most often happens between the ages of 15 and 52 and can last for several years.       Symptoms can be managed through medicines, lifestyle changes, and complementary therapies such as acupuncture.       Eat a balanced diet that is rich in nutrients to promote bone health and heart health and to manage symptoms during menopause.     This information is not intended to replace advice given to you by your health care provider. Make sure you discuss any questions you have with your health care provider.     Document Released: 03/09/2005Document Revised: 09/17/2021Document Reviewed: 06/18/2019     Elsevier Patient Education ? 2022 Elsevier Inc.

## 2020-12-28 NOTE — Progress Notes (Deleted)
 Permanent cessation of menstraution after 12 consecutive months of amenorrhea.   Median age 50 years  - Evidence of vasomotor instability (hot flashes/flushes),--most sponatenously improve within 2-3 months some continue for 4-5 years.   Increased risk for UTIs with increase in vaginal fluid Ph  - Common sexual symptoms (decreased desire, arousal, difficulty with orgasm).   - Treatment generally discontinues after 5 yars or by age 64    Dress in laers, decrease ambient temperature, decrese TEOH, headlthy diet/ exercise

## 2020-12-28 NOTE — Progress Notes (Signed)
 [facility] INFECTIOUS DISEASE  [facility] Infectious Disease  [address]  [place] [address]  [place] [address]  Dept: [phone]     Patient ID: [name] [name] is a 50 y.o. female who presents for consultation for chronic hepatitis B    Subjective   HPI    50 yo F with hx of fibroid s/p total abdominal hysterectomy and bilateral salpingectomy with excision of umbilical hernia, HLD, iron deficiency, chronic hepB presents for consultation.     She was diagnosed with hepB in 2018 when donated blood. No family member has hepB. 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 Denies abdominal pain, N/V/D, no unintentional weight loss, no bloating.     Patient was seen in PCP clinic on 09/05/20 for follow up on chronic hepB. She has not received any treatment. Her last liver US in 10/2018 showed hepatic steatosis without evidence of hepatic mass lesion, adenomyomatosis of the gallbladder fundus.       Labs available:   HBsAg+, HbcAB+, HBeAg - (09/05/20)  HBV DNA VL >2901  LFT (09/05/20): AST 26, ALT 21, ALP 81, TB 0.6      Review of Systems   Constitutional: Negative for chills, fever, malaise/fatigue and weight loss.   Respiratory: Negative for shortness of breath.    Cardiovascular: Negative for chest pain.   Gastrointestinal: Negative for abdominal pain, diarrhea, nausea and vomiting.   Genitourinary: Negative for dysuria, frequency and urgency.          PMH  -migraine   -HLD  -iron deficiency  -chronic hepB    PSH  -hysterectomy, bilateral salpingectomy for fibroids and endometriosis (2018)  -excision of umbilical hernia    Medications: Sumatriptan       Allergies: chloroquine (tinnitus), PCN (hives), iron infusion      FH  -Sister: fibroids   -older sister: hypothyroidism   -Younger sister: polyps  -Mother: DM     SH  -Never smoke  -No alcohol, no recreational drug use   -Born in [place], immigrated  to the [place] in 2003  -Live in [place], lives alone   -Working for [employer], desk job       Objective   There were no vitals taken for this visit.    Physical Exam  Constitutional:       General: She is not in acute distress.     Appearance: She is obese. She is not ill-appearing.   HENT:      Head: Normocephalic.      Mouth/Throat:      Mouth: Mucous membranes are moist.   Cardiovascular:      Rate and Rhythm: Normal rate and regular rhythm.      Pulses: Normal pulses.      Heart sounds: Normal heart sounds.   Pulmonary:      Effort: Pulmonary effort is normal.   Abdominal:      General: Bowel sounds are normal. There is distension.      Palpations: Abdomen is soft.      Tenderness: There is no abdominal tenderness. There is no guarding or rebound.      Comments: Lower abdomen old scar noted   Musculoskeletal:      Cervical back: Neck supple.   Neurological:      Mental Status: She is alert.         Assessment/Plan   50  yo F with hx of fibroid s/p total abdominal hysterectomy and bilateral salpingectomy with excision of umbilical hernia, HLD, iron deficiency, chronic hepB presents for consultation.     #Chronic Hepatitis B  -q75mos follow up  -f/u US Abdomen   -f/u HCV ab, HDV Ag/Ab, Fibrotest-Acti test panel.   -no need to treat if LFT <2 ULN unless cirrhosis. tx would be entecavir, TAF, TDF    MIPS  Last hemoglobin A1c: No results found for: HGBA1C     - Does patient had a history of hypertension: no  - Does patient have a history of diabetes: no    Bertrum Sol, M.D.   Infectious Disease Fellow

## 2020-12-28 NOTE — Assessment & Plan Note (Signed)
-   Did not connect with provider at [facility] wellness, stopped seeing provider. Will provide her with external referral.

## 2020-12-28 NOTE — Assessment & Plan Note (Signed)
 Hx of hyterectomy in 2017. Admits to sx of hot flashes/flushes associated with menopause.   - Discussed average age of menopause is 37 years (may undergo 2 years earlier). Reviewed menopausal stages (premenopause, monpausal transition, postmenopause) expressed hot flashes present in up to 80% of those in menopausal transition.   - Discussed behavioral modifications for vasomotor symptoms (dress in layers, decrease ambient temperature, patient does not drink alcohol, health diet/exercise). Encourgaged yoga, minduflness, CBT.  - Patient interested in hormone replacement therapy for moderate-severe symptoms. If HRT ineffective may consider SSRI/SNIR (I.e. paroextine), then gabapentin/pregablin. No contraindications to estrogen therpy (hx or high risk for CVD, breast/uterinie CA, DVT, acutre liver disease). Reviewed side effect profile (increased stroke risk, uterine bleeding, breast tenderness, risk of VTE).  - Will prescribe lowest effective dose & attempt to discontinue taper q6-12 months (gradual taper).   - Given patient in late 52s-50s with classic symptoms no role for labs--LH & FSH not routine as both may be normal during menopausal transition.   - Will star patient on climara 0.025 weekly x 4 weeks. Advised patient that she can shower with the patch, apply to upper abdomen, rotate placement.   - Will refer patient to OB/GYN for continued monitoring and follow up.

## 2021-01-02 ENCOUNTER — Telehealth (HOSPITAL_BASED_OUTPATIENT_CLINIC_OR_DEPARTMENT_OTHER): Admitting: Physician Assistant

## 2021-01-02 ENCOUNTER — Encounter (HOSPITAL_BASED_OUTPATIENT_CLINIC_OR_DEPARTMENT_OTHER)

## 2021-01-02 MED ORDER — estradiol (Climara) 0.05 mg/24 hr
0.05 | MEDICATED_PATCH | TRANSDERMAL | 1 refills | Status: DC
Start: 2021-01-02 — End: 2021-02-28

## 2021-01-02 NOTE — Telephone Encounter (Signed)
 [facility]   [address]  [address] [place] [address]  Dept: [phone]  Dept Fax: [phone]   Telephone Encounter    Patient ID:  Client Name: [name] [name]   Gender: female   MR: [id number]    Date of Birth: 1970-10-12   Age: 50 y.o.    Communication    Contacted patient, [name] [name], via telephone at [phone]    Increased Climara from 0.025 to 0.05. Counseled patient on side effects (blood clots, breast tenderness, mood swings).     Patient is in [place] on vacation, [facility] [place]. Advised patient to pick up script.     Previously referred to OB/GYN at last visit. Advised patient to schedule.  ______________________________  Cathrine Muster, PA  01/02/21  3:45 PM

## 2021-01-03 LAB — LIVER FIBROSIS, FIBROTEST-ACTITEST PANEL W/ HFP & CBC/D
ALT: 19 U/L (ref 6–29)
Alpha-2-Macroglobulin: 210 mg/dL (ref 106–279)
Apolipoprotein A1: 188 mg/dL (ref 101–198)
Fibrosis Score: 0.12
GGT: 52 U/L (ref 3–55)
Haptoglobin: 163 mg/dL (ref 43–212)
Necroinflammat Act Score: 0.06
Reference ID: 4155670
Total Bilirubin: 0.4 mg/dL (ref 0.2–1.2)

## 2021-01-08 LAB — HDV ANTIBODY, IGG/IGM: Hepatitis D Antibody, Total: NEGATIVE

## 2021-01-11 ENCOUNTER — Encounter (HOSPITAL_BASED_OUTPATIENT_CLINIC_OR_DEPARTMENT_OTHER)

## 2021-01-15 ENCOUNTER — Other Ambulatory Visit (HOSPITAL_BASED_OUTPATIENT_CLINIC_OR_DEPARTMENT_OTHER): Admitting: Internal Medicine

## 2021-02-09 ENCOUNTER — Other Ambulatory Visit

## 2021-02-14 ENCOUNTER — Encounter (HOSPITAL_BASED_OUTPATIENT_CLINIC_OR_DEPARTMENT_OTHER)

## 2021-02-15 ENCOUNTER — Encounter (HOSPITAL_BASED_OUTPATIENT_CLINIC_OR_DEPARTMENT_OTHER): Admitting: Obstetrics & Gynecology

## 2021-02-16 ENCOUNTER — Encounter (HOSPITAL_BASED_OUTPATIENT_CLINIC_OR_DEPARTMENT_OTHER)

## 2021-02-16 ENCOUNTER — Encounter

## 2021-02-16 ENCOUNTER — Encounter (HOSPITAL_BASED_OUTPATIENT_CLINIC_OR_DEPARTMENT_OTHER): Admitting: Obstetrics & Gynecology

## 2021-02-16 ENCOUNTER — Ambulatory Visit: Payer: PRIVATE HEALTH INSURANCE | Attending: Obstetrics & Gynecology | Primary: Internal Medicine

## 2021-02-16 ENCOUNTER — Telehealth (HOSPITAL_BASED_OUTPATIENT_CLINIC_OR_DEPARTMENT_OTHER): Admitting: Obstetrics & Gynecology

## 2021-02-16 NOTE — Telephone Encounter (Signed)
Pt is calling because she had an appt today but, canceled via the portal. Pt would like to reschedule. Please, call pt back at best contact number [phone] Thank You!
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
## 2021-02-28 ENCOUNTER — Other Ambulatory Visit

## 2021-02-28 ENCOUNTER — Institutional Professional Consult (permissible substitution)
Admit: 2021-02-28 | Discharge: 2021-02-28 | Payer: PRIVATE HEALTH INSURANCE | Attending: Obstetrics & Gynecology | Primary: Internal Medicine

## 2021-02-28 ENCOUNTER — Encounter (HOSPITAL_BASED_OUTPATIENT_CLINIC_OR_DEPARTMENT_OTHER)

## 2021-02-28 VITALS — BP 113/70 | HR 85 | Temp 98.5°F | Ht 65.0 in | Wt 248.0 lb

## 2021-02-28 DIAGNOSIS — Z1231 Encounter for screening mammogram for malignant neoplasm of breast: Secondary | ICD-10-CM

## 2021-02-28 DIAGNOSIS — N951 Menopausal and female climacteric states: Secondary | ICD-10-CM

## 2021-02-28 MED ORDER — estradiol (Climara) 0.1 mg/24 hr
0.1 | MEDICATED_PATCH | TRANSDERMAL | 0 refills | Status: DC
Start: 2021-02-28 — End: 2021-05-30
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
## 2021-03-01 ENCOUNTER — Encounter (HOSPITAL_BASED_OUTPATIENT_CLINIC_OR_DEPARTMENT_OTHER): Admitting: Obstetrics & Gynecology

## 2021-03-02 ENCOUNTER — Telehealth (HOSPITAL_BASED_OUTPATIENT_CLINIC_OR_DEPARTMENT_OTHER)
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
## 2021-03-09 ENCOUNTER — Telehealth (HOSPITAL_BASED_OUTPATIENT_CLINIC_OR_DEPARTMENT_OTHER): Admitting: Internal Medicine

## 2021-03-09 ENCOUNTER — Encounter (HOSPITAL_BASED_OUTPATIENT_CLINIC_OR_DEPARTMENT_OTHER)

## 2021-03-09 ENCOUNTER — Other Ambulatory Visit (HOSPITAL_BASED_OUTPATIENT_CLINIC_OR_DEPARTMENT_OTHER): Admitting: Internal Medicine

## 2021-03-09 NOTE — Telephone Encounter (Signed)
Patient is requesting a callback from brad  In regards to losing number to specialist she was referred too and was hoping to get the information again callback # [phone]

## 2021-03-09 NOTE — Telephone Encounter (Signed)
Patient is asking for  Anther referral to pt     Name of Speciality visit: PT  Physician Name: [facility]  Physician NPI: [id number]        Phone#/Fax#: Fax# [phone] Phone# [phone]

## 2021-03-09 NOTE — Telephone Encounter (Signed)
Called pt and sent her message for the numbers. Thank you!  Nida BoatmanBrad

## 2021-03-10 NOTE — Telephone Encounter (Signed)
[facility]  [address]  [address]  [place] [address]  Phone:  [phone]  Fax:  [phone]         Patient:     Legal Name: [name] [name] MRN:  [id number]   Address:  [address]  [place] [id number] DOB:  [birth year]  Sex:  F  Phone: [phone]      INSURANCE PAYOR PLAN GROUP # SUBSCRIBER ID   Primary:    [place] [employer] [id number]      Order Date:  Mar 09, 2021     Referral to:  EXT  Physical Therapy Referral  NPI: [id number]                     Referring Provider Information:  Massie MaroonHUANG, KRISTIN Phone: [phone] Fax: [phone]      Referral Information:   # Visits:  1 Referral Type: Consultation [3]   Urgency:  Routine Referral Reason: Specialty Services Required   Start Date: Mar 09, 2021 End Date:  To be determined by Insurer   Diagnosis:  Chronic right-sided back pain, unspecified back location (M54.9,G89.29)            My clinical question is: Chronic right sided back pain      ____________________________________________________________________  Authorized by:  Massie MaroonKristin Huang, MD                  This document serves as a request of services and does not constitute Insurance authorization or approval of services.  To determine eligibility, please contact the members Insurance carrier to verify and review coverage.     If you have medical questions regarding this request for services. Please contact [facility]  at [phone] between the hours of 8:00am - 5:00pm (Mon-Fri).           Ordered by: Blake DivinePrasanti Ravipati, MD     Electronically Signed by: Massie MaroonKristin Huang, MD EGB:[id number]:[phone] at 7:54 PM on Mar 09, 2021          Referral faxed to [facility] via Epic    Transmission confirmations received as shown above.    Derinda LateZi Demosthenes Virnig, RN  03/10/2021  8:42 AM

## 2021-03-13 ENCOUNTER — Other Ambulatory Visit (HOSPITAL_BASED_OUTPATIENT_CLINIC_OR_DEPARTMENT_OTHER): Admitting: Internal Medicine

## 2021-03-13 NOTE — Progress Notes (Signed)
Please review and sign the Order for Colonoscopy Screening. Patient replied YES to Colonoscopy as an option for Colorectal Cancer Screening in 2023 via Text Msg. Thank you.

## 2021-03-15 ENCOUNTER — Ambulatory Visit: Payer: PRIVATE HEALTH INSURANCE | Attending: Physician Assistant | Primary: Internal Medicine
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
## 2021-03-16 ENCOUNTER — Encounter (HOSPITAL_BASED_OUTPATIENT_CLINIC_OR_DEPARTMENT_OTHER): Admitting: Physician Assistant

## 2021-03-16 ENCOUNTER — Encounter (HOSPITAL_BASED_OUTPATIENT_CLINIC_OR_DEPARTMENT_OTHER)

## 2021-03-16 ENCOUNTER — Telehealth
Admit: 2021-03-16 | Discharge: 2021-03-16 | Payer: PRIVATE HEALTH INSURANCE | Attending: Physician Assistant | Primary: Internal Medicine

## 2021-03-16 ENCOUNTER — Encounter

## 2021-03-16 DIAGNOSIS — G8929 Other chronic pain: Secondary | ICD-10-CM

## 2021-03-16 NOTE — Assessment & Plan Note (Addendum)
Improving  - Hx of chronic right-sided back pain since 09/05/2020. DDx: Lumbar strain, degenerative disease (discs (spondylosis), facet joints (osteoarthritis), spondylolisthesis, herniated disc, spinal stenosis, osteoporosis).  - Completed 9 sessions of PT therapy interested in referral to aquatic PT therapy.   - Continue to maintain activity as tolerated. Heating pad (low setting) ~20 minutes q2h followed by gentle stretching learned from PT.   - Will provide patient with medical necessity letter, advised referral may not be covered by insurance.   - For pain can take either ibuprofen 400-800 mg PO q8h PRN or naproxen 250-500 mg q12h PRN.  - May consider XR lumbar spine af f/u appt with PCP given chronicity of back pain.
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
## 2021-03-16 NOTE — Assessment & Plan Note (Signed)
-   Patient aware of associated health risks of obesity (prediabetes, T2DM, HTN, HLD< CVD, gallstones, GERD, OA, OSA, stroke, CA)  - No contributing hx of mood disorder or hypothyroidism. Without medications related to weight gain (atypical antipsychotics, antidepressants, antiepiletics, glucocorticoids, DM medications).   - Given BMI > 30 consider intensive multi component behavioral intervention. Previously met with weight and wellness, wasn't good fit with provider. Will give new referral to establish with new nutritionist. Recommended patient call and schedule. Patient already with external referral from 12/28/20.   - Consider regular weights at home > 1x/week for weight loss & weight maintenance.   - Continue to monitor.

## 2021-03-28 ENCOUNTER — Other Ambulatory Visit (HOSPITAL_BASED_OUTPATIENT_CLINIC_OR_DEPARTMENT_OTHER): Admitting: Physician Assistant

## 2021-03-28 ENCOUNTER — Encounter (HOSPITAL_BASED_OUTPATIENT_CLINIC_OR_DEPARTMENT_OTHER)

## 2021-03-28 NOTE — Progress Notes (Signed)
Following note from Janann August, practice coordinator.     Please redirect the referral for Avianah Pellman [id number] to [facility].    [facility] located on [address] and is staffed by four dietitians who work with obese, morbidly obese patients:    Herbie Baltimore, Lucretia Roers, Lyda Kalata, and Lester Kinsman.        Orders Placed This Encounter   Procedures   . INT  [facility] ([facility])           Standing Status:   Future     Standing Expiration Date:   03/29/2022     Referral Priority:   Routine     Referral Type:   Consultation     Referral Reason:   Specialty Services Required     Number of Visits Requested:   [address]         Cathrine Muster, [place]  03/28/21  2:34 PM

## 2021-04-04 ENCOUNTER — Encounter (HOSPITAL_BASED_OUTPATIENT_CLINIC_OR_DEPARTMENT_OTHER)

## 2021-04-07 ENCOUNTER — Encounter (HOSPITAL_BASED_OUTPATIENT_CLINIC_OR_DEPARTMENT_OTHER)

## 2021-04-20 ENCOUNTER — Other Ambulatory Visit: Admit: 2021-04-20 | Payer: PRIVATE HEALTH INSURANCE | Primary: Internal Medicine

## 2021-04-20 ENCOUNTER — Ambulatory Visit
Admit: 2021-04-20 | Discharge: 2021-04-20 | Payer: PRIVATE HEALTH INSURANCE | Attending: Internal Medicine | Primary: Internal Medicine

## 2021-04-20 ENCOUNTER — Encounter

## 2021-04-20 DIAGNOSIS — B181 Chronic viral hepatitis B without delta-agent: Secondary | ICD-10-CM

## 2021-04-20 LAB — HEMOGLOBIN A1C: HEMOGLOBIN A1C % (INT/EXT): 5.1 % (ref <5.6)

## 2021-04-20 LAB — COMPREHENSIVE METABOLIC PANEL
ALT: 31 U/L (ref 0–55)
AST: 26 U/L (ref 6–42)
Albumin: 4.4 g/dL (ref 3.2–5.0)
Alkaline phosphatase: 105 U/L (ref 30–130)
Anion Gap: 12 mmol/L (ref 3–14)
BUN: 3 mg/dL — ABNORMAL LOW (ref 6–24)
Bilirubin, total: 0.2 mg/dL (ref 0.2–1.2)
CO2 (Bicarbonate): 23 mmol/L (ref 20–32)
Calcium: 9.6 mg/dL (ref 8.5–10.5)
Chloride: 105 mmol/L (ref 98–110)
Creatinine: 0.56 mg/dL (ref 0.55–1.30)
Glucose: 97 mg/dL (ref 70–139)
Potassium: 3.6 mmol/L (ref 3.6–5.2)
Protein, total: 8 g/dL (ref 6.0–8.4)
Sodium: 140 mmol/L (ref 135–146)
eGFRcr: 112 mL/min/{1.73_m2} (ref >=60)

## 2021-04-20 LAB — CBC WITH DIFFERENTIAL
Basophils %: 0.4 %
Basophils Absolute: 0.02 10*3/uL (ref 0.00–0.22)
Eosinophils %: 1.6 %
Eosinophils Absolute: 0.09 10*3/uL (ref 0.00–0.50)
Hematocrit: 42.6 % (ref 32.0–47.0)
Hemoglobin: 14.1 g/dL (ref 11.0–16.0)
Immature Granulocytes %: 0.2 %
Immature Granulocytes Absolute: 0.01 10*3/uL (ref 0.00–0.10)
Lymphocyte %: 43.4 %
Lymphocytes Absolute: 2.39 10*3/uL (ref 0.70–4.00)
MCH: 29.6 pg (ref 26.0–34.0)
MCHC: 33.1 g/dL (ref 31.0–37.0)
MCV: 89.3 fL (ref 80.0–100.0)
MPV: 10.4 fL (ref 9.1–12.4)
Monocytes %: 7.6 %
Monocytes Absolute: 0.42 10*3/uL (ref 0.36–0.77)
NRBC %: 0 % (ref 0.0–0.0)
NRBC Absolute: 0 10*3/uL (ref 0.00–2.00)
Neutrophil %: 46.8 %
Neutrophils Absolute: 2.58 10*3/uL (ref 1.50–7.95)
Platelets: 311 10*3/uL (ref 150–400)
RBC: 4.77 M/uL (ref 3.70–5.20)
RDW-CV: 13.3 % (ref 11.5–14.5)
RDW-SD: 43.6 fL (ref 35.0–51.0)
WBC: 5.5 10*3/uL (ref 4.0–11.0)

## 2021-04-20 LAB — HEPATITIS B SURFACE ANTIBODY QUANT
Hepatitis B surface antibody conc: 23.6 m[IU]/mL
Hepatitis B surface antibody: REACTIVE

## 2021-04-20 LAB — HEPATITIS B CORE ANTIBODY, TOTAL: Hepatitis B core antibody, total: REACTIVE — AB

## 2021-04-20 LAB — LIPID PANEL
Cholesterol/HDL Ratio: 3.2
Cholesterol: 225 mg/dL — ABNORMAL HIGH (ref <=200)
HDL cholesterol: 70 mg/dL (ref >=40)
LDL cholesterol, calculated: 139 mg/dL — ABNORMAL HIGH (ref 0–130)
Non-HDL Calculation: 155 mg/dL (ref <160)
Triglycerides: 80 mg/dL (ref <=150)

## 2021-04-20 LAB — BILIRUBIN, DIRECT: Bilirubin, direct: <0.2 mg/dL (ref 0.0–0.5)

## 2021-04-20 LAB — HEPATITIS B SURFACE ANTIGEN
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
## 2021-04-20 NOTE — Assessment & Plan Note (Signed)
She endorses feeling down and depressed, she has low interest in things that she used to enjoy. She has very low energy as well. She is not interested in starting a medication at this time but is interested in therapy.     -PHQ9 at next visit

## 2021-04-20 NOTE — Assessment & Plan Note (Signed)
-   Colonoscopy scheduled 08/2021  - Mammogram: 02/2020 suspicious cyst, had u/s aspiration w/ no samples sent and annual mammo rec. Ordered 02/2021, patient to schedule, she is scheduled for gyn follow up 05/30/21  - S/p TAH and BSO in 2018, no longer requires pap smears   TDAP 2017  COVID  x 3

## 2021-04-20 NOTE — Result Quicknote (Signed)
Hi [name] [name],    It was a pleasure seeing you yesterday. Your cholesterol is a little better than last time, but still high. This can lead to an increased risk for heart attacks and strokes.  The best way to try and improve this is by diet, exercise, and weight loss. You can talk about this more at the nutrition appointment, but I would recommend trying to reduce carbohydrates, sweets, and sugary drinks like soda or juice.  I would also suggest increased consumption of fish that contain high amounts of omega-3 fatty acids such as white fish or scallops.  I would also try to increase aerobic exercise.  With your underlying risk factors, your 10 year risk of a heart attack or stroke or your ASCVD Risk is 0.8%.    Your HbA1C was normal, you do not have diabetes.    Your labs show that you have chronic hepatitis B, as you know, but since your basic liver tests are normal you do not need treatment.

## 2021-04-20 NOTE — Assessment & Plan Note (Addendum)
-   She was referred to weight and wellness and has nutrition visit 06/2021.   - Discussed exercise and healthy eating  - She is open to trying medication assisted weight loss although afraid of meds liver profile with her chronic hep B   - F/u lipid panel and HbA1C

## 2021-04-20 NOTE — Assessment & Plan Note (Signed)
She was seen by ID 12/2020 and referred by [place] in 08/2020. HBsAg+, HbcAB+, HBeAg - (09/05/20) HBV DNA VL >2901 LFT (09/05/20): AST 26, ALT 21, ALP 81, TB 0.6. ID rec HCV ab neg HDV Ag/Ab neg, fibortest-ACt test panel as well as US abdomen. They recommended treatment only if LFT>2 ULN or if patient with cirrhosis.

## 2021-04-20 NOTE — Assessment & Plan Note (Signed)
She has a history of OSA and lost her CPAP machine years ago. Epsworth sleepiness scale 11. She is having excessive day time sleepiness making work difficult. Will order repeat sleep study and hopefully she can get CPAP

## 2021-04-21 ENCOUNTER — Telehealth: Admit: 2021-04-21 | Discharge: 2021-04-21 | Payer: PRIVATE HEALTH INSURANCE | Primary: Internal Medicine

## 2021-04-21 DIAGNOSIS — Z6841 Body Mass Index (BMI) 40.0 and over, adult: Secondary | ICD-10-CM

## 2021-04-21 LAB — HEPATITIS B SURFACE ANTIGEN CONFIRMATION: Hepatitis B surface antigen confirmation: POSITIVE — AB

## 2021-04-21 LAB — HEPATITIS B VIRUS DNA, QUANTITATIVE, RT-TMA
Hepatitis B DNA Viral Load, IU/mL: 498 [IU]/mL — ABNORMAL HIGH
Hepatitis B DNA Viral Load, Interp: DETECTED — AB
Hepatitis B DNA Viral Load, copies/mL: 1698 {copies}/mL — ABNORMAL HIGH
Hepatitis B DNA Viral Load, log IU/mL: 2.7 {Log_IU}/mL — ABNORMAL HIGH
Hepatitis B DNA Viral Load, log copies/mL: 3.23 {Log_copies}/mL — ABNORMAL HIGH

## 2021-04-21 MED ORDER — diclofenac (Voltaren) 1 % topical gel
1 | Freq: Four times a day (QID) | TOPICAL | 3 refills | 27.50000 days | Status: AC | PRN
Start: 2021-04-21 — End: ?

## 2021-04-21 NOTE — Progress Notes (Signed)
[facility] [place]    [name] [name] is a 51 y.o. female, presenting today to establish clinical pharmacy services for collaborative management of weight loss. Malikah O Ognibene also has chronic hep B, and OSA. The purpose of this session was to discuss initiation of Wegovy (Semaglutide). Patient's insurance requires a prior authorization. Patient came to discussion with several questions related to medication mechanism and safety in hep B.     OBESITY  Starting weight: 114.3 kg (251.46), Starting BMI: 41.93  Current weight: 114.3 kg (251.46)    Currently taking: none  Previously tried for weight loss: has yo-yo dieted in the past, but always gains weight back  Medications contributing to weight: none  Does not have diabetes    MEDICATIONS    Outpatient Encounter Medications as of 04/21/2021   Medication Sig Dispense Refill   . estradiol (Climara) 0.1 mg/24 hr Place 1 patch on the skin 1 (one) time per week. 12 patch 0   . SUMAtriptan (Imitrex) 50 mg tablet Take by mouth.       No facility-administered encounter medications on file as of 04/21/2021.     ASSESSMENT & PLAN     OBESITY    Through video visit, I explained Wegovy (Semaglutide) mechanism, possible GI side effects, and mitigation strategies. I explained dose titration schedule of increasing dose every 4 weeks and goal of reaching max 2.4mg  for maximal effectiveness. I demonstrated administration using a demo Wegovy (Semaglutide) pen. Will await PA approval. Education on proper disposal of product was provided.    Plan:     1. Submit PA for Wegovy 0.25 mg weekly     2. Collect diet log and discuss lifestyle changes at next visit    3. Counseled on safety and efficacy of medication use in patients with chronic hep B and that there are no disease or drug interactions    4. Patient asked for Rx for diclofenac gel to see if insurance would cover it since she uses it on her knees and back and runs out quickly    PharmD f/u: Will  reach out when PA results are in    Otho Darner, PharmD  Clinical Pharmacy Specialist - Primary Care 4A  [phone]    This real-time, interactive virtual Telehealth encounter was done by video with the patient's verbal consent. Two patient identifiers were used and confirmed. Physical location of the patient: home Patient resides in: [place]  Physical location of the provider: office. Other participants/involvement: none  Total minutes spent: 30

## 2021-04-24 LAB — HEPATITIS BE ANTIGEN: Hep Be Antigen: NONREACTIVE

## 2021-04-24 LAB — HEPATITIS BE ANTIBODY: Hepatitis Be Antibody: REACTIVE — AB

## 2021-04-26 LAB — LIVER FIBROSIS, FIBROTEST-ACTITEST PANEL W/ HFP & CBC/D
ALT: 24 U/L (ref 6–29)
Alpha-2-Macroglobulin: 219 mg/dL (ref 106–279)
Apolipoprotein A1: 172 mg/dL (ref 101–198)
Fibrosis Score: 0.12
GGT: 76 U/L — ABNORMAL HIGH (ref 3–55)
Haptoglobin: 127 mg/dL (ref 43–212)
Necroinflammat Act Score: 0.08
Reference ID: 4314985
Total Bilirubin: 0.2 mg/dL (ref 0.2–1.2)

## 2021-05-30 ENCOUNTER — Ambulatory Visit: Admit: 2021-05-30 | Discharge: 2021-05-30 | Payer: PRIVATE HEALTH INSURANCE | Attending: Obstetrics & Gynecology

## 2021-05-30 ENCOUNTER — Encounter

## 2021-05-30 DIAGNOSIS — Z01419 Encounter for gynecological examination (general) (routine) without abnormal findings: Secondary | ICD-10-CM

## 2021-05-30 LAB — URINALYSIS REFLEX TO CULTURE
Bacteria, Ur: NEGATIVE
Bilirubin, Ur: NEGATIVE
Blood, Ur: NEGATIVE
Glucose, Ur: NEGATIVE
Hyaline Casts, Ur: 9 /LPF — ABNORMAL HIGH (ref 0–8)
Leukocyte Esterase, Ur: NEGATIVE
Nitrite, Ur: NEGATIVE
RBC, Ur: <1 {cells}/[HPF] (ref 0–4)
Specific Gravity, Ur: 1.022 (ref 1.001–1.035)
Squamous Epithelial Cells, Ur: NEGATIVE /HPF
Urobilinogen, Ur: 0.2 EU
WBC, Ur: 2 {cells}/[HPF] (ref 0–5)
pH, Ur: 5.5
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
## 2021-05-30 NOTE — Telephone Encounter (Signed)
Please update patient has appt today at 1:40 pm

## 2021-05-31 MED ORDER — estradiol (Climara) 0.1 mg/24 hr
0.1 | MEDICATED_PATCH | TRANSDERMAL | 0 refills | Status: AC
Start: 2021-05-31 — End: ?

## 2021-06-05 ENCOUNTER — Encounter: Admit: 2021-06-05 | Payer: PRIVATE HEALTH INSURANCE | Primary: Internal Medicine

## 2021-06-05 DIAGNOSIS — F32A Depression, unspecified: Secondary | ICD-10-CM

## 2021-06-16 ENCOUNTER — Encounter: Admit: 2021-06-16 | Payer: PRIVATE HEALTH INSURANCE | Primary: Internal Medicine

## 2021-06-16 NOTE — Progress Notes (Signed)
Erroneous Encounter Disregard

## 2021-06-20 ENCOUNTER — Ambulatory Visit: Admit: 2021-06-20 | Discharge: 2021-06-20 | Payer: PRIVATE HEALTH INSURANCE | Attending: Registered"

## 2021-06-20 DIAGNOSIS — Z6841 Body Mass Index (BMI) 40.0 and over, adult: Secondary | ICD-10-CM
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
## 2021-06-23 ENCOUNTER — Ambulatory Visit: Payer: PRIVATE HEALTH INSURANCE | Primary: Internal Medicine

## 2021-06-28 ENCOUNTER — Ambulatory Visit: Payer: PRIVATE HEALTH INSURANCE | Attending: Internal Medicine | Primary: Internal Medicine

## 2021-06-28 NOTE — Progress Notes (Deleted)
 [facility]  [facility]  [address]  [place] [address]  [place] [address]  Dept: [phone]     Patient ID: [name] [name] is a 51 y.o. female who presents for consultation for chronic hepatitis B    Subjective   HPI    51 yo F with hx of fibroid s/p total abdominal hysterectomy and bilateral salpingectomy with excision of umbilical hernia, HLD, iron deficiency, chronic hepB presents for consultation.     She was diagnosed with hepB in 2018 when donated blood. No family member has hepB. 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 Denies abdominal pain, N/V/D, no unintentional weight loss, no bloating.     Patient was seen in PCP clinic on 09/05/20 for follow up on chronic hepB. She has not received any treatment. Her last liver US in 10/2018 showed hepatic steatosis without evidence of hepatic mass lesion, adenomyomatosis of the gallbladder fundus.       Labs available:   HBsAg+, HbcAB+, HBeAg - (09/05/20)  HBV DNA VL >2901  LFT (09/05/20): AST 26, ALT 21, ALP 81, TB 0.6      Review of Systems   Constitutional: Negative for chills, fever, malaise/fatigue and weight loss.   Respiratory: Negative for shortness of breath.    Cardiovascular: Negative for chest pain.   Gastrointestinal: Negative for abdominal pain, diarrhea, nausea and vomiting.   Genitourinary: Negative for dysuria, frequency and urgency.          PMH  -migraine   -HLD  -iron deficiency  -chronic hepB    PSH  -hysterectomy, bilateral salpingectomy for fibroids and endometriosis (2018)  -excision of umbilical hernia    Medications: Sumatriptan       Allergies: chloroquine (tinnitus), PCN (hives), iron infusion      FH  -Sister: fibroids   -older sister: hypothyroidism   -Younger sister: polyps  -Mother: DM     SH  -Never smoke  -No alcohol, no recreational drug use   -Born in [place], immigrated  to the [place] in 2003  -Live in [place], lives alone   -Working for [employer], desk job       Objective   There were no vitals taken for this visit.    Physical Exam  Constitutional:       General: She is not in acute distress.     Appearance: She is obese. She is not ill-appearing.   HENT:      Head: Normocephalic.      Mouth/Throat:      Mouth: Mucous membranes are moist.   Cardiovascular:      Rate and Rhythm: Normal rate and regular rhythm.      Pulses: Normal pulses.      Heart sounds: Normal heart sounds.   Pulmonary:      Effort: Pulmonary effort is normal.   Abdominal:      General: Bowel sounds are normal. There is distension.      Palpations: Abdomen is soft.      Tenderness: There is no abdominal tenderness. There is no guarding or rebound.      Comments: Lower abdomen old scar noted   Musculoskeletal:      Cervical back: Neck supple.   Neurological:      Mental Status: She is alert.         Assessment/Plan   50  yo F with hx of fibroid s/p total abdominal hysterectomy and bilateral salpingectomy with excision of umbilical hernia, HLD, iron deficiency, chronic hepB presents for consultation.     #Chronic Hepatitis B  -q66mos follow up  -f/u US Abdomen   -f/u HCV ab, HDV Ag/Ab, Fibrotest-Acti test panel.   -no need to treat if LFT <2 ULN unless cirrhosis. tx would be entecavir, TAF, TDF    MIPS  Last hemoglobin A1c:   Lab Results   Component Value Date/Time    HGBA1C 5.1 04/20/2021 1603        - Does patient had a history of hypertension: no  - Does patient have a history of diabetes: no    Bertrum Sol, M.D.   Infectious Disease Fellow

## 2021-07-17 ENCOUNTER — Ambulatory Visit: Payer: PRIVATE HEALTH INSURANCE | Attending: Internal Medicine | Primary: Internal Medicine

## 2021-07-17 NOTE — Progress Notes (Deleted)
[facility]  [facility]  [address]  [place] [address]  [place] [address]  Dept: [phone]     Patient ID: [name] [name] is a 51 y.o. female who presents for consultation for chronic hepatitis B    Subjective   HPI    51 yo F with hx of fibroid s/p total abdominal hysterectomy and bilateral salpingectomy with excision of umbilical hernia, HLD, iron deficiency, chronic hepB presents for consultation.     She was diagnosed with hepB in 2018 when donated blood. No family member has hepB. 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 Denies abdominal pain, N/V/D, no unintentional weight loss, no bloating.     Patient was seen in PCP clinic on 09/05/20 for follow up on chronic hepB. She has not received any treatment. Her last liver US in 10/2018 showed hepatic steatosis without evidence of hepatic mass lesion, adenomyomatosis of the gallbladder fundus.       Labs available:   HBsAg+, HbcAB+, HBeAg - (09/05/20)  HBV DNA VL >2901  LFT (09/05/20): AST 26, ALT 21, ALP 81, TB 0.6      Review of Systems   Constitutional: Negative for chills, fever, malaise/fatigue and weight loss.   Respiratory: Negative for shortness of breath.    Cardiovascular: Negative for chest pain.   Gastrointestinal: Negative for abdominal pain, diarrhea, nausea and vomiting.   Genitourinary: Negative for dysuria, frequency and urgency.          PMH  -migraine   -HLD  -iron deficiency  -chronic hepB    PSH  -hysterectomy, bilateral salpingectomy for fibroids and endometriosis (2018)  -excision of umbilical hernia    Medications: Sumatriptan       Allergies: chloroquine (tinnitus), PCN (hives), iron infusion      FH  -Sister: fibroids   -older sister: hypothyroidism   -Younger sister: polyps  -Mother: DM     SH  -Never smoke  -No alcohol, no recreational drug use   -Born in [place], immigrated  to the [place] in 2003  -Live in [place], lives alone   -Working for [employer], desk job       Objective   There were no vitals taken for this visit.    Physical Exam  Constitutional:       General: She is not in acute distress.     Appearance: She is obese. She is not ill-appearing.   HENT:      Head: Normocephalic.      Mouth/Throat:      Mouth: Mucous membranes are moist.   Cardiovascular:      Rate and Rhythm: Normal rate and regular rhythm.      Pulses: Normal pulses.      Heart sounds: Normal heart sounds.   Pulmonary:      Effort: Pulmonary effort is normal.   Abdominal:      General: Bowel sounds are normal. There is distension.      Palpations: Abdomen is soft.      Tenderness: There is no abdominal tenderness. There is no guarding or rebound.      Comments: Lower abdomen old scar noted   Musculoskeletal:      Cervical back: Neck supple.   Neurological:      Mental Status: She is alert.          Latest Reference Range &  Units 04/20/21 16:03   Aspartate Aminotransferase (AST) 6 - 42 U/L 26   Alanine Aminotransferase (ALT) 6 - 29 U/L  0 - 55 U/L 24  31      Latest Reference Range & Units 05/28/17 14:07 10/15/18 14:53 04/09/19 16:57   HX HEPATITIS B DNA VIRAL LOAD IU/mL 719.0 ! 189.0 ! 711.0 !   HX HEPATITIS B DNA VIRAL LOAD, LOG Log IU/mL 2.86 ! 2.28 ! 2.85 !   HX HEP B VIRAL DNA copies/mL 2,452.0 ! 644.0 ! 2,425.0 !   !: Data is abnormal     Latest Reference Range & Units 09/05/20 16:18 04/20/21 16:03   Hepatitis B DNA Viral Load, IU/mL Not Detected IU/mL 2,901 (H) 498 (H)   Hepatitis B DNA Viral Load, log IU/mL Not Detected log IU/mL 3.46 (H) 2.70 (H)   Hepatitis B DNA Viral Load, copies/mL Not Detected copies/mL 9,892 (H) 1,698 (H)   Hepatitis B DNA Viral Load, log copies/mL Not Detected log copies/mL 3.99 (H) 3.23 (H)   Hepatitis B DNA Viral Load, Interp Not Detected  Detected ! Detected !   (H): Data is abnormally high  !: Data is abnormal    Assessment/Plan   51 yo F with hx of fibroid s/p total  abdominal hysterectomy and bilateral salpingectomy with excision of umbilical hernia, HLD, iron deficiency, chronic hepB presents for consultation.     #Chronic Hepatitis B  -q36mos follow up  -f/u US Abdomen     -no need to treat if LFT <2 ULN unless cirrhosis. tx would be entecavir, TAF, TDF    MIPS  Last hemoglobin A1c:   Lab Results   Component Value Date/Time    HGBA1C 5.1 04/20/2021 1603        - Does patient had a history of hypertension: no  - Does patient have a history of diabetes: no

## 2021-08-07 ENCOUNTER — Encounter: Admit: 2021-08-07 | Payer: PRIVATE HEALTH INSURANCE | Attending: Registered" | Primary: Internal Medicine

## 2021-08-07 ENCOUNTER — Ambulatory Visit: Admit: 2021-08-07 | Discharge: 2021-08-07 | Payer: PRIVATE HEALTH INSURANCE | Attending: Internal Medicine

## 2021-08-07 DIAGNOSIS — B181 Chronic viral hepatitis B without delta-agent: Secondary | ICD-10-CM

## 2021-08-07 NOTE — Progress Notes (Signed)
[facility] INFECTIOUS DISEASE  [facility] Infectious Disease  [address]  [place] [address]  [place] [address]  Dept: [phone]   Subjective   HPI    Ms [name] [name] is here for follow up.  51 yo F with hx of fibroid s/p total abdominal hysterectomy and bilateral salpingectomy with excision of umbilical hernia, HLD, iron deficiency, chronic hepB presents for consultation.     She was diagnosed with hepB in 2018 when donated blood. No family member has hepB. 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     She is doing well today.  She has no new abdominal pain, nausea, vomiting, diarrhea.  She has not yet obtained an abdominal ultrasound.    Labs available:   HBsAg+, HbcAB+, HBeAg - (09/05/20)  HBV DNA VL >2901  LFT (09/05/20): AST 26, ALT 21, ALP 81, TB 0.6      Review of Systems   Constitutional: Negative for chills, fever, malaise/fatigue and weight loss.   Respiratory: Negative for shortness of breath.    Cardiovascular: Negative for chest pain.   Gastrointestinal: Negative for abdominal pain, diarrhea, nausea and vomiting.   Genitourinary: Negative for dysuria, frequency and urgency.          PMH  -migraine   -HLD  -iron deficiency  -chronic hepB    PSH  -hysterectomy, bilateral salpingectomy for fibroids and endometriosis (2018)  -excision of umbilical hernia    Medications: Sumatriptan       Allergies: chloroquine (tinnitus), PCN (hives), iron infusion      FH  -Sister: fibroids   -older sister: hypothyroidism   -Younger sister: polyps  -Mother: DM     SH  -Never smoke  -No alcohol, no recreational drug use   -Born in [place], immigrated to the [place] in [place]  -Live in [place], lives alone   -Working for [employer], desk job       Objective   Visit Vitals  BP 116/80   Pulse 89   Temp 36.6 C (97.8 F)   Resp 16   Ht 1.651 m   Wt 104.3 kg   SpO2 99%   BMI 38.27 kg/m   BSA 2.19 m        Physical Exam  Constitutional:       General: She is not in acute distress.     Appearance: She is not ill-appearing.   HENT:      Head: Normocephalic and atraumatic.      Mouth/Throat:      Mouth: Mucous membranes are moist.   Cardiovascular:      Rate and Rhythm: Normal rate and regular rhythm.      Pulses: Normal pulses.      Heart sounds: Normal heart sounds.   Pulmonary:      Effort: Pulmonary effort is normal.   Abdominal:      General: Bowel sounds are normal.      Palpations: Abdomen is soft.      Tenderness: There is no abdominal tenderness. There is no guarding or rebound.      Comments: Lower abdomen old scar noted   Musculoskeletal:      Cervical back: Neck supple.   Neurological:      Mental Status: She is alert.   Psychiatric:         Mood and Affect: Mood normal.  Behavior: Behavior normal.         Thought Content: Thought content normal.         Judgment: Judgment normal.          Latest Reference Range & Units 04/20/21 16:03   Aspartate Aminotransferase (AST) 6 - 42 U/L 26   Alanine Aminotransferase (ALT) 6 - 29 U/L  0 - 55 U/L 24  31      Latest Reference Range & Units 05/28/17 14:07 10/15/18 14:53 04/09/19 16:57   HX HEPATITIS B DNA VIRAL LOAD IU/mL 719.0 ! 189.0 ! 711.0 !   HX HEPATITIS B DNA VIRAL LOAD, LOG Log IU/mL 2.86 ! 2.28 ! 2.85 !   HX HEP B VIRAL DNA copies/mL 2,452.0 ! 644.0 ! 2,425.0 !   !: Data is abnormal     Latest Reference Range & Units 09/05/20 16:18 04/20/21 16:03   Hepatitis B DNA Viral Load, IU/mL Not Detected IU/mL 2,901 (H) 498 (H)   Hepatitis B DNA Viral Load, log IU/mL Not Detected log IU/mL 3.46 (H) 2.70 (H)   Hepatitis B DNA Viral Load, copies/mL Not Detected copies/mL 9,892 (H) 1,698 (H)   Hepatitis B DNA Viral Load, log copies/mL Not Detected log copies/mL 3.99 (H) 3.23 (H)   Hepatitis B DNA Viral Load, Interp Not Detected  Detected ! Detected !   (H): Data is abnormally high  !: Data is abnormal    Assessment/Plan   51 yo F with hx of fibroid s/p total  abdominal hysterectomy and bilateral salpingectomy with excision of umbilical hernia, HLD, iron deficiency, chronic hepB presents for planned follow-up.  She has no new symptoms.  Fibrosis score is F0.  Her most recent labs in April 2023 are reassuring.    #Chronic Hepatitis B  -q73mos follow up with hep B DNA PCR, LFTs  -f/u US Abdomen, to be done at the [facility] in [place] as she lives in [place]    -no need to treat if LFT <2 ULN unless cirrhosis. tx would be entecavir, TAF, TDF    MIPS  Last hemoglobin A1c:   Lab Results   Component Value Date/Time    HGBA1C 5.1 04/20/2021 1603        - Does patient had a history of hypertension: no  - Does patient have a history of diabetes: no      Arlys John D.W. Aveleen Nevers, MD

## 2021-08-07 NOTE — Telephone Encounter (Signed)
Pt call to let know that she still waiting for her video app  for today please reach pt # [phone], thank you.

## 2021-08-09 NOTE — Telephone Encounter (Signed)
LVM for patient her appt is cancelled cb to reschedule

## 2021-08-09 NOTE — Telephone Encounter (Signed)
-----   Message from [place], [place] sent at 08/09/2021 11:08 AM EDT -----  Regarding: Milon Dikes, can we reschedule her to come back in late Oct/Nov to initiate [employer]? Cancel televisit for tomorrow.     Candise Bowens, you saw her for consultation in April. Note said a prior authorization should be submitted. Do you know if it was done already? I had difficulty finding it. I know you have mentioned PAs are best submitted closest to the appt time. If not I can set a reminder to submit for the PA in October. Let me know. :)

## 2021-08-10 ENCOUNTER — Ambulatory Visit: Payer: PRIVATE HEALTH INSURANCE | Attending: Physician Assistant | Primary: Internal Medicine

## 2021-08-23 NOTE — Telephone Encounter (Signed)
Spoke with pt set up a TL for pt for the med issue and set up f/u with pcp on Nov. Thank you!  [name]

## 2021-08-23 NOTE — Telephone Encounter (Signed)
Pt would like to call back regarding reschedule sooner appt with PCP and pt stated she cannot come tomorrow since she has a conflict tomorrow.    Pt would sooner appt due to need to discuss medications with pcp    Please cb. Thanks.   [phone]

## 2021-08-24 ENCOUNTER — Ambulatory Visit
Payer: PRIVATE HEALTH INSURANCE | Attending: Student in an Organized Health Care Education/Training Program | Primary: Internal Medicine

## 2021-08-24 ENCOUNTER — Ambulatory Visit: Payer: PRIVATE HEALTH INSURANCE | Attending: Internal Medicine | Primary: Internal Medicine

## 2021-08-24 ENCOUNTER — Ambulatory Visit: Admit: 2021-08-24 | Discharge: 2021-08-24 | Payer: PRIVATE HEALTH INSURANCE | Attending: Internal Medicine

## 2021-08-24 DIAGNOSIS — W19XXXA Unspecified fall, initial encounter: Secondary | ICD-10-CM

## 2021-08-24 MED ORDER — semaglutide, weight loss, (Wegovy) 0.25 mg/0.5 mL pen injector
0.25 | SUBCUTANEOUS | 0 refills | 28.00000 days | Status: DC
Start: 2021-08-24 — End: 2021-09-28

## 2021-08-24 NOTE — Assessment & Plan Note (Signed)
Patient presents b/c she fell yesterday. Fall was mechanical, patient not on AC and only sx is fatigue. She did not loose consciousness.     Discussed patient if worsening fatigue, confusion, LH, dizziness, vision changes etc to present to ED immediately.    Gave work letter stating no restrictions per work and patient request.

## 2021-08-24 NOTE — Assessment & Plan Note (Signed)
Patient notes leakage of small amount urine when she is not able to go to bathroom in time. She has tried kegel exercises but is unsure if she is doing them right.    Printed out Kegel exercise instruction from [employer]. Will f/u at next visit.

## 2021-08-24 NOTE — Progress Notes (Signed)
[facility]  [address] [place] [address]  Dept: [phone]  Dept Fax: [phone]      Chief Complaint:  Back Pain    Subjective    Subjective     HPI  51 y.o. female patient who presents for Back Pain.     [name] [name] is a 51 y/o F w/ chronic viral hep B, OSA, HA and depression who presents for urgent care after a fall.     Patient presents b/c she fell yesterday. She slipped on her neighbor's kids toy and fell backwards. When she told her manager about it, they asked that she see provider to clear for work. She fell back onto the carpet and hit the back of her head. She had a slight HA yesterday, resolved today. She has felt tired since falling. She denies any dizziness or LH. She denies any weakness or any worsening of hip or back pain (chronic). She did not loose consciousness. The fall was unwitnessed.     She also notices urge incontinence with urine leakage. She has tried Kegel exercises but is not sure if she is doing them correctly.     Patient is interested in starting [facility]. She does not want to wait for PA from insurance and would like to pay out of pocket. She understands that if she were to stop the medication she would more than likely gain weight back. She understands the side effects of abdominal pain, N/V/D.         Social History     Tobacco Use   . Smoking status: Never   . Smokeless tobacco: Never   Vaping Use   . Vaping Use: Never used   Substance Use Topics   . Alcohol use: Not Currently   . Drug use: Never           Allergies   Allergen Reactions   . Chloroquine Phosphate      Other reaction(s): hives, imbalance, tinnits   . Ferric Carboxymaltose Hives   . Penicillin G Benzathine      Other reaction(s): hives, swelling   . Chloroquine Headache, Hives, Rash and Tinnitus        Current Outpatient Medications   Medication Instructions   . diclofenac (Voltaren) 1 % topical gel topical (top), 4 times daily PRN   . estradiol (Climara) 0.1 mg/24 hr PLACE 1  PATCH ON THE SKIN 1 TIME PER WEEK.   . SUMAtriptan (Imitrex) 50 mg tablet oral   . Wegovy 0.25 mg, subcutaneous, Every 7 days        Objective    Objective     Vital Signs  BP 100/70   Pulse 84   Temp 36.3 C (97.3 F) (Temporal)   Ht 1.651 m   Wt 116.6 kg Comment: 257lbs with shoes  BMI 42.77 kg/m     Physical Exam  Constitutional:       Appearance: Normal appearance.   Eyes:      Extraocular Movements: Extraocular movements intact.      Conjunctiva/sclera: Conjunctivae normal.      Pupils: Pupils are equal, round, and reactive to light.   Cardiovascular:      Rate and Rhythm: Normal rate.      Heart sounds: Normal heart sounds.   Pulmonary:      Effort: Pulmonary effort is normal. No respiratory distress.   Abdominal:      General: Abdomen is flat.  Palpations: Abdomen is soft.   Skin:     General: Skin is warm.   Neurological:      General: No focal deficit present.      Mental Status: She is alert and oriented to person, place, and time.      Cranial Nerves: No cranial nerve deficit.      Sensory: No sensory deficit.      Motor: No weakness.      Gait: Gait normal.           Lab Results   Component Value Date    CHOL 225 (H) 04/20/2021    TRIG 80 04/20/2021    HDL 70 04/20/2021    LDLCALC 139 (H) 04/20/2021    HGBA1C 5.1 04/20/2021    WBC 5.5 04/20/2021    HGB 14.1 04/20/2021    HCT 42.6 04/20/2021    PLT 311 04/20/2021    ALT 24 04/20/2021    ALT 31 04/20/2021    AST 26 04/20/2021    NA 140 04/20/2021    K 3.6 04/20/2021    CL 105 04/20/2021    CREATININE 0.56 04/20/2021    BUN 3 (L) 04/20/2021    EGFR 112 04/20/2021    CO2 23 04/20/2021        Health Maintenance Due   Topic Date Due   . Colorectal Cancer Screening  Never done   . COVID-19 Vaccine (1) Never done   . MMR Vaccines (1 of 1 - Standard series) Never done   . Hepatitis A Vaccines (1 of 2 - Risk 2-dose series) Never done   . Zoster Vaccines (1 of 2) Never done   . Influenza Vaccine (1) 09/15/2021       Assessment/Plan    Asssessment/Plan    [name] [name] is a 51 y/o F w/ chronic viral hep B, OSA, HA and depression who presents for f/u.     Fall, initial encounter  Assessment & Plan:  Patient presents b/c she fell yesterday. Fall was mechanical, patient not on AC and only sx is fatigue. She did not loose consciousness.     Discussed patient if worsening fatigue, confusion, LH, dizziness, vision changes etc to present to ED immediately.    Gave work letter stating no restrictions per work and patient request.  BMI 40.0-44.9, adult (CMS/HCC)  Assessment & Plan:  Patient is interested in starting [facility] 04/21/21. She previously saw pharm D Otho Darner for initiation and was instructed on how to use but was awaiting PA. She does not want to wait for PA from insurance and would like to pay out of pocket. She understands that if she were to stop the medication she would more than likely gain weight back. She understands the side effects of abdominal pain, N/V/D.     Sent script to pharmacy for 0.25mg  weekly for 28 day supply . Will ask to schedule f/u with Victorino Dike pharmD w/in 4 weeks.  Orders:  -     semaglutide, weight loss, (Wegovy) 0.25 mg/0.5 mL pen injector; Inject 0.5 mL (0.25 mg) under the skin every 7 (seven) days for 28 days.  Class 3 severe obesity due to excess calories with serious comorbidity and body mass index (BMI) of 40.0 to 44.9 in adult (CMS/HCC)  Urge incontinence of urine  Assessment & Plan:  Patient notes leakage of small amount urine when she is not able to go to bathroom in time. She has tried kegel exercises but is unsure if she is doing them  right.    Printed out Kegel exercise instruction from [employer]. Will f/u at next visit.       The patient was staffed with the attending, Dr. Renaldo Reel

## 2021-08-24 NOTE — Telephone Encounter (Signed)
Spoke with pt, she will come this afternoon. Thank you!  [name]

## 2021-08-24 NOTE — Assessment & Plan Note (Signed)
Patient is interested in starting [facility] 04/21/21. She previously saw pharm D Otho Darner for initiation and was instructed on how to use but was awaiting PA. She does not want to wait for PA from insurance and would like to pay out of pocket. She understands that if she were to stop the medication she would more than likely gain weight back. She understands the side effects of abdominal pain, N/V/D.     Sent script to pharmacy for 0.25mg  weekly for 28 day supply . Will ask to schedule f/u with Victorino Dike pharmD w/in 4 weeks.

## 2021-08-24 NOTE — Telephone Encounter (Signed)
Pt would like to call back regarding pt just had a fall yesterday and would like to see pcp today in person / TL.     Pt originally had the appt with pcp today but was cancelled & she would like to see pcp today if possible for clearance to work.     Please cb. Thanks.   [phone]

## 2021-08-29 ENCOUNTER — Ambulatory Visit: Payer: PRIVATE HEALTH INSURANCE | Attending: Internal Medicine | Primary: Internal Medicine

## 2021-08-30 MED ORDER — polyethylene glycol (Colyte) 240-22.72-6.72 -5.84 gram solution
240-22.72-6.72 | Freq: Once | ORAL | 0 refills | 28.00000 days | Status: AC
Start: 2021-08-30 — End: 2021-08-30

## 2021-09-01 NOTE — Telephone Encounter (Signed)
Patient called in regards to prescription for colonoscopy requesting cb from NURSE regarding instructions cb pt.

## 2021-09-04 NOTE — Telephone Encounter (Signed)
Tc to pt:  Pt instructed to call colonoscopy for medications and directions prior to the procedure.    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  Letter needs to indicate that she suffers from migraines, takes meds for migraines.    Meyer RusselLinda Myldred Raju, RN, 09/01/2021, 3:04 PM

## 2021-09-05 NOTE — Telephone Encounter (Signed)
Brad, please let patient know letter is ready and ask if she wants it sent over portal or printed and left at front desk for pick up. I forwarded you the letter so you can do whatever she requests.

## 2021-09-08 ENCOUNTER — Inpatient Hospital Stay: Admit: 2021-09-08 | Discharge: 2021-09-08 | Payer: PRIVATE HEALTH INSURANCE | Attending: Gastroenterology

## 2021-09-08 ENCOUNTER — Encounter

## 2021-09-08 DIAGNOSIS — Z1211 Encounter for screening for malignant neoplasm of colon: Secondary | ICD-10-CM

## 2021-09-08 MED ORDER — lidocaine PF (Xylocaine) 20 mg/mL (2 %) injection
20 | INTRAMUSCULAR | Status: DC | PRN
Start: 2021-09-08 — End: 2021-09-08
  Administered 2021-09-08: 18:00:00 40 via INTRAVENOUS

## 2021-09-08 MED ORDER — sodium chloride 0.9 % infusion
0.9 | INTRAVENOUS | Status: DC | PRN
Start: 2021-09-08 — End: 2021-09-08
  Administered 2021-09-08: 18:00:00 via INTRAVENOUS

## 2021-09-08 MED ORDER — propofol (Diprivan) injection
10 | INTRAVENOUS | Status: DC | PRN
Start: 2021-09-08 — End: 2021-09-08
  Administered 2021-09-08 (×2): 150 via INTRAVENOUS
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
## 2021-09-08 NOTE — Op Note (Signed)
Pt A&O, RA satting 100%, abdomen soft nondistended, passing flatus, able to tolerate solid foods, NS through PIV, pt oobtc steady on feet, denies dizziness and lightheadedness, PIV d/c'd, pt home with ride.

## 2021-09-08 NOTE — Anesthesia Post-Procedure Evaluation (Signed)
Patient: Lenore Cordiadetokunbo O Millican    Procedure Summary     Date: 09/08/21 Room / Location: [facility] Gastroenterology Endoscopy    Anesthesia Start: 1338 Anesthesia Stop: 1359    Procedure: COLONOSCOPY Diagnosis:       Screening for malignant neoplasm of colon      (Screening for colorectal malignant neoplasm)      (This is the patient's first colonoscopy)    Scheduled Providers: Lucille PassyHarmony Allison V, MD; Jackey LogeMichael Semenovski, MD Responsible Provider: Jackey LogeMichael Semenovski, MD    Anesthesia Type: MAC ASA Status: 3          Anesthesia Type: MAC    Vitals Value Taken Time   BP 122/62 09/08/21 1403   Temp 36.3 C (97.4 F) 09/08/21 1403   Pulse 88 09/08/21 1403   Resp 20 09/08/21 1403   SpO2 98 % 09/08/21 1403       Anesthesia Post Evaluation Note:    Patient location during evaluation: Endo Recovery  Patient participation: able to participate  Level of consciousness: arousable  Cardiovascular and Hydration status: stable and vital signs within acceptable range  Respiratory Status Stable and Airway Patent: yes  Nausea and Vomiting Control Satisfactory: yes  Pain score: 0  Pain management: adequate     Vitals reviewed: yes  Unplanned ICU Admission: noPatient has recovered from anesthesia and has returned to baseline mental status, cardiovascular and respiratory function. Pain, nausea, and vomiting are adequately controlled and the patient is adequately hydrated and appropriate for discharge from PACU?: yes      There were no known notable events for this encounter.
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
## 2021-09-12 LAB — TISSUE PATHOLOGY

## 2021-09-15 ENCOUNTER — Telehealth: Payer: PRIVATE HEALTH INSURANCE | Primary: Internal Medicine

## 2021-09-15 NOTE — Progress Notes (Addendum)
Joined zoom but patient did not show    Also called patient's mobile phone and left VM with my contact info.    Otho Darner, PharmD, BCACP  Clinical Pharmacy Specialist - Primary Care 4A  [phone]    This real-time, interactive virtual Telehealth encounter was done by video with the patient's verbal consent. Two patient identifiers were used and confirmed. Physical location of the patient: other Patient resides in: [place]  Physical location of the provider: office. Other participants/involvement: none  Total minutes spent: 10

## 2021-09-28 ENCOUNTER — Telehealth: Admit: 2021-09-28 | Discharge: 2021-09-28 | Payer: PRIVATE HEALTH INSURANCE | Primary: Internal Medicine

## 2021-09-28 DIAGNOSIS — Z6841 Body Mass Index (BMI) 40.0 and over, adult: Secondary | ICD-10-CM

## 2021-09-28 MED ORDER — semaglutide, weight loss, (Wegovy) 0.25 mg/0.5 mL pen injector
0.25 | SUBCUTANEOUS | 0 refills | 28.00000 days | Status: AC
Start: 2021-09-28 — End: ?

## 2021-09-28 NOTE — Progress Notes (Signed)
[facility] PRIMARY [facility] [name]    [name] [name] [name] is a 51 y.o. female, presenting today to establish clinical pharmacy services for collaborative management of weight loss. The purpose of this session was to discuss initiation of Wegovy (Semaglutide). Presents for initial device teaching and counseling.     Subjective     OBESITY  Starting weight: 113.4 kg (249 lbs), Starting BMI: 41.6  Current weight: 1134 kg     LIFESTYLE    Patient has exhausted lifestyle modifications to diet/exercise and has participated in an outpatient weight loss program and has failed to lose 5% body weight.    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 - does not eat as well when she goes into the office    24-hour Diet Recall:  -Breakfast: usually skips  -Lunch: eats pastries at work (corn muffin, jelly cookies)  -Dinner: gets home late/hasn't cooked for the last 2 weeks, has scallion pancakes from [employer], when she cooks she makes fish and veggies  -Snacks: unsalted nuts (cashews, peanuts)  -Beverages: psyllium husk fiber in water, water with lemon, green tea, no alcohol    -Activity/Exercise: does not exercise, sitting mostly    Objective     LABS    Lab Results   Component Value Date    HGBA1C 5.1 04/20/2021    CHOL 225 (H) 04/20/2021    LDLCALC 139 (H) 04/20/2021    HDL 70 04/20/2021    TRIG 80 04/20/2021       Outpatient Encounter Medications as of 09/28/2021   Medication Sig Dispense Refill   . diclofenac (Voltaren) 1 % topical gel Apply topically if needed in the morning, at noon, in the evening, and at bedtime for pain. 100 g 3   . estradiol (Climara) 0.1 mg/24 hr PLACE 1 PATCH ON THE SKIN 1 TIME PER WEEK. 12 patch 0   . semaglutide, weight loss, (Wegovy) 0.25 mg/0.5 mL pen injector Inject 0.5 mL (0.25 mg) under the skin every 7 (seven) days. 2 mL 0   . SUMAtriptan (Imitrex) 50 mg tablet Take by mouth.     . [DISCONTINUED] semaglutide, weight loss, (Wegovy) 0.25 mg/0.5 mL pen injector Inject 0.5 mL  (0.25 mg) under the skin every 7 (seven) days for 28 days. 2 mL 0     No facility-administered encounter medications on file as of 09/28/2021.       Assessment/Plan   ASSESSMENT & PLAN     OBESITY    Through video visit, I explained Wegovy (Semaglutide) mechanism, possible GI side effects, and mitigation strategies. I explained dose titration schedule of increasing dose every 4 weeks and goal of reaching max 2.4mg  for maximal effectiveness. I demonstrated administration using a demo Wegovy (Semaglutide) pen. Education on proper disposal of product was provided.     Plan:     1. Start Wegovy 0.25 mg weekly - submit PA   Monitoring: nausea, vomiting, abdominal pain, bloating, triglycerides   Counseling: eat smaller/more frequent meals throughout the day, stay well hydrated    Week 1-4: 0.25 mg once weekly  Week 5-8: 0.5 mg once weekly  Week 9-12: 1 mg once weekly  Week 13-16: 1.7 mg once weekly  Week 17 and thereafter (maintenance dose): 2.4 mg once weekly    2. Continue to implement lifestyle changes to diet/exercise routine.     PharmD f/u: 4 weeks phone check-in to assess tolerability and titrate dose    Otho Darner, PharmD, [facility]  Clinical Pharmacy Specialist -  Primary Care 4A  [phone]    This real-time, interactive virtual Telehealth encounter was done by video with the patient's verbal consent. Two patient identifiers were used and confirmed. Physical location of the patient: home Patient resides in: [place]  Physical location of the provider: office. Other participants/involvement: none  Total minutes spent: 45

## 2021-09-28 NOTE — Progress Notes (Signed)
Spoke with patient to let her know that her insurance company does have [employer]eg[employer] on formulary and does not require a PA, but the copay is still very high since the plan covers ~$500 towards the medication each month. This makes the monthly copay for the patient about $800. Offered the patient the copay card link which would help cover an additional $225 per month and the patient was interested in taking that information for now.     She is going to think about what she would like to do. I provided my contact info and sent her the copay card link via the patient portal.     Otho DarnerJennifer Nimisha Rathel, PharmD, [facility]BCACP  Clinical Pharmacy Specialist - Primary Care 4A  [phone]

## 2021-10-26 ENCOUNTER — Telehealth: Admit: 2021-10-26 | Discharge: 2021-10-26 | Payer: PRIVATE HEALTH INSURANCE | Primary: Internal Medicine

## 2021-10-26 DIAGNOSIS — Z6841 Body Mass Index (BMI) 40.0 and over, adult: Secondary | ICD-10-CM

## 2021-10-26 NOTE — Progress Notes (Addendum)
As discussed at previous visit, pt's insurance covers the [facility] but still has a very high copay of $839 per month (covering about half the cash price of the drug). Even with the manufacturer coupon card (max coverage of $225 per month), the medication is still ~$600 per month which patient cannot afford. Encouraged patient to focus on lifestyle changes at this time. Patient is agreeable and will reach out if anything changes in the future.     Offered referral to [facility] for RD counseling, which patient denied since she wants to focus on making changes on her own.     Otho Darner, PharmD, BCACP  Clinical Pharmacy Specialist - Primary Care 4A  [phone]    This real-time, interactive virtual Telehealth encounter was done by phone with the patient's verbal consent. Two patient identifiers were used and confirmed. Physical location of the patient: home Patient resides in: [place]  Physical location of the provider: office. Other participants/involvement: none  Total minutes spent: 10

## 2021-11-03 NOTE — Telephone Encounter (Signed)
Pt called in requesting to speak to a nurse because she suffers from migraine.Pt said she needs a reasonable accommodation form so she can have accomodations work it in the morning.Pt would like someone to call to let her know how to send it over .Pt stated she will try and send it through my [employer] but she tends to have a hard time with that so she would like a call back as well.    Please call pt back at [phone]     Thank you

## 2021-11-03 NOTE — Telephone Encounter (Signed)
Try to call pt to figure out how to help her, but no one pick up, unable to [facility]. Thank you!  [name]

## 2021-11-03 NOTE — Telephone Encounter (Signed)
Patient returning a missed call regarding her accomodation letter.

## 2021-11-07 NOTE — Telephone Encounter (Signed)
Called pt again, but no one pick up. Thank you!  [name]

## 2021-11-23 ENCOUNTER — Ambulatory Visit: Payer: PRIVATE HEALTH INSURANCE | Attending: Internal Medicine | Primary: Internal Medicine

## 2021-11-23 ENCOUNTER — Telehealth
Admit: 2021-11-23 | Discharge: 2021-11-23 | Payer: PRIVATE HEALTH INSURANCE | Attending: Internal Medicine | Primary: Internal Medicine

## 2021-11-23 DIAGNOSIS — R202 Paresthesia of skin: Secondary | ICD-10-CM

## 2021-11-23 MED ORDER — buPROPion (Wellbutrin) 75 mg tablet
75 | ORAL_TABLET | Freq: Two times a day (BID) | ORAL | 1 refills | Status: AC
Start: 2021-11-23 — End: 2022-01-22

## 2021-11-23 NOTE — Assessment & Plan Note (Signed)
Patient has previously tried SSRI and felt it worsened her mood. Will trial buprprion 75mg  BID, start qday for one week then increases to BID.    Follow up in one month.

## 2021-11-23 NOTE — Progress Notes (Deleted)
[facility]  [address] [place] [address]  Dept: [phone]  Dept Fax: [phone]      Chief Complaint:  No chief complaint on file.    Subjective    Subjective     HPI  51 y.o. female patient who presents for No chief complaint on file.. {Click here to update CC (Optional):37742}    [name] [name] is a 51 y/o F with HLD, obesity, chronic viral hepatitis B, OSA, migraines who presents for f/u for multiple medical problems.    #Right sided back pain   #Right knee pain  Patient completed 9 sessions of PT Aug to Sept 2022.   She was taking about 2 tabs of NSAIDs daily. But she is now using diclofenac gel with great benefit in her knee pain and does not need oral NSIADs with this. She is now able to walk up and down stairs without difficulty. She would like to use this for her back pain as well.     Today she states that her back and knee pain are ***    #Obesity:  #HLD  She was started on Wegovy for weight loss and is following with pharmD Otho Darner for titration and management. She states that ****.     #Chronic viral hepatitis B   She was seen by ID 12/2020 and referred by [place] in 08/2020. HBsAg+, HbcAB+, HBeAg - (09/05/20) HBV DNA VL >2901 LFT (09/05/20): AST 26, ALT 21, ALP 81, TB 0.6. ID rec HCV ab neg HDV Ag/Ab neg, fibortest-ACt test panel as well as US abdomen. They recommended treatment only if LFT>2 ULN or if patient with cirrhosis.  LFTs 04/2021 were wnl. Hep BeAg Non reactive but Ab reactive 04/2021. HBV viral load 1,698. Needs checked every 6 to 12 months.     #Migraines/Tension HA   She takes sumatriptan (8 tabes per month max), has enough tabs ****    #OSA    #Depression    #HCM:  Colonoscopy done 08/2021, needs repeat in 2026  Mammogram: 02/2020 suspicion, had u/s aspiration w/ no samples sent and annual mammo rec. Ordered 02/2021, patient to schedule  S/p TAH and BSO in 2018, no longer requires pap smears   TDAP 2017  COVID  x 3   Lipids 04/2021: TC 225, TG 80,  LDL 139, HDL 70, repeat lipids today  HbA1C 04/2021: 5.1     Lipids 04/2021: TC 225, TG 80, LDL 139, HDL 70  HbA1C 04/2021: 5.1       Social History     Tobacco Use   . Smoking status: Never   . Smokeless tobacco: Never   Vaping Use   . Vaping Use: Never used   Substance Use Topics   . Alcohol use: Not Currently   . Drug use: Never       {Click here to update allergies (Optional):37742}                        Allergies   Allergen Reactions   . Chloroquine Phosphate      Other reaction(s): hives, imbalance, tinnits   . Ferric Carboxymaltose Hives   . Penicillin G Benzathine      Other reaction(s): hives, swelling   . Chloroquine Headache, Hives, Rash and Tinnitus        Current Outpatient Medications   Medication Instructions   . diclofenac (Voltaren) 1 % topical gel topical (top), 4 times daily PRN   .  estradiol (Climara) 0.1 mg/24 hr PLACE 1 PATCH ON THE SKIN 1 TIME PER WEEK.   . SUMAtriptan (Imitrex) 50 mg tablet oral   . Wegovy 0.25 mg, subcutaneous, Every 7 days        Objective    Objective     Vital Signs  There were no vitals taken for this visit.    Physical Exam  {GAD7, PHQ9 (Optional):37742}    Lab Results   Component Value Date    CHOL 225 (H) 04/20/2021    TRIG 80 04/20/2021    HDL 70 04/20/2021    LDLCALC 139 (H) 04/20/2021    HGBA1C 5.1 04/20/2021    WBC 5.5 04/20/2021    HGB 14.1 04/20/2021    HCT 42.6 04/20/2021    PLT 311 04/20/2021    ALT 24 04/20/2021    ALT 31 04/20/2021    AST 26 04/20/2021    NA 140 04/20/2021    K 3.6 04/20/2021    CL 105 04/20/2021    CREATININE 0.56 04/20/2021    BUN 3 (L) 04/20/2021    EGFR 112 04/20/2021    CO2 23 04/20/2021        Health Maintenance Due   Topic Date Due   . COVID-19 Vaccine (1) Never done   . MMR Vaccines (1 of 1 - Standard series) Never done   . Hepatitis A Vaccines (1 of 2 - Risk 2-dose series) Never done   . Zoster Vaccines (1 of 2) Never done   . Influenza Vaccine (1) 09/15/2021       Assessment/Plan    Asssessment/Plan     51 y.o. female patient with hx  of *** presents for No chief complaint on file..    There are no diagnoses linked to this encounter.    The patient was staffed with the attending, Dr. Ardyth Gal    {To finalize your note and route to preceptor, make sure "Cosign required" box is checked, refresh note, then change bottom to "sign on saving note" followed by "accept" (Optional):37742}

## 2021-11-23 NOTE — Assessment & Plan Note (Signed)
She was seen by ID 12/2020 and referred by [place] in 08/2020. HBsAg+, HbcAB+, HBeAg - (09/05/20) HBV DNA VL >2901 LFT (09/05/20): AST 26, ALT 21, ALP 81, TB 0.6. ID rec HCV ab neg HDV Ag/Ab neg, fibortest-ACt test panel as well as US abdomen. They recommended treatment only if LFT>2 ULN or if patient with cirrhosis.  LFTs 04/2021 were wnl. Hep BeAg Non reactive but Ab reactive 04/2021. HBV viral load 1,698. Needs checked every 6 to 12 months.     Labs ordered today but patient will get when she returns for in person follow up.

## 2021-11-23 NOTE — Assessment & Plan Note (Addendum)
She endorses right hand tingling intermittently without pain or weakness. This happens almost daily and started about a month ago.     She is seen virtually but needs to f/u in person for physical exam/ neuro exam.     If exam fine, would consider carpal tunnel syndrome and trying wrist splint

## 2021-11-23 NOTE — Progress Notes (Signed)
[facility]  [address] [place] [address]  Dept: [phone]  Dept Fax: [phone]      Chief Complaint:  No chief complaint on file.    Subjective    Subjective     HPI  51 y.o. female patient who presents for No chief complaint on file.[name] [name] is a 51 y/o F with HLD, obesity, chronic viral hepatitis B, OSA, migraines who presents for f/u for multiple medical problems.    Today she states she is having tingling of the right hand. This happens almost every day. She is not having any arm pain or weakness.     #Right sided back pain   #Right knee pain  Patient completed 9 sessions of PT Aug to Sept 2022.   She was taking about 2 tabs of NSAIDs daily. But she is now using diclofenac gel with great benefit in her knee pain and does not need oral NSIADs with this. She is now able to walk up and down stairs without difficulty. She would like to use this for her back pain as well.     Today she states that her back and knee pain are if anything worse. She feels it worse when it rains/storms.     #Obesity:  #HLD  She wanted to start [facility] for weight loss but unfortunately it was not covered by insurance and would be 600$ monthly.     #Chronic viral hepatitis B   She was seen by ID 12/2020 and referred by [place] in 08/2020. HBsAg+, HbcAB+, HBeAg - (09/05/20) HBV DNA VL >2901 LFT (09/05/20): AST 26, ALT 21, ALP 81, TB 0.6. ID rec HCV ab neg HDV Ag/Ab neg, fibortest-ACt test panel as well as US abdomen. They recommended treatment only if LFT>2 ULN or if patient with cirrhosis.  LFTs 04/2021 were wnl. Hep BeAg Non reactive but Ab reactive 04/2021. HBV viral load 1,698. Needs checked every 6 to 12 months.     #Migraines/Tension HA   She takes sumatriptan (8 tabes per month max), she has enough tabs.    She is unfortunately having a migraine today so she called out of work and is seen virtually. She typically has migraines at night and needs a form filled out that she will fax  to [place].     #OSA  #Depression  She is interested in trying a medication for depression but would like something activating.     #HCM:  Colonoscopy done 08/2021, needs repeat in 2026  Mammogram: 02/2020 suspicion, had u/s aspiration w/ no samples sent and annual mammo rec. Ordered 02/2021, patient to schedule  S/p TAH and BSO in 2018, no longer requires pap smears   TDAP 2017  COVID  x 3   Lipids 04/2021: TC 225, TG 80, LDL 139, HDL 70, repeat lipids today  HbA1C 04/2021: 5.1               Social History     Tobacco Use   . Smoking status: Never   . Smokeless tobacco: Never   Vaping Use   . Vaping Use: Never used   Substance Use Topics   . Alcohol use: Not Currently   . Drug use: Never                               Allergies   Allergen Reactions   . Chloroquine  Phosphate      Other reaction(s): hives, imbalance, tinnits   . Ferric Carboxymaltose Hives   . Penicillin G Benzathine      Other reaction(s): hives, swelling   . Chloroquine Headache, Hives, Rash and Tinnitus        Current Outpatient Medications   Medication Instructions   . buPROPion (WELLBUTRIN) 75 mg, oral, 2 times daily, Please take 1 tablet one time a day for one week then take 1 tablet two times a day.   . diclofenac (Voltaren) 1 % topical gel topical (top), 4 times daily PRN   . estradiol (Climara) 0.1 mg/24 hr PLACE 1 PATCH ON THE SKIN 1 TIME PER WEEK.   . SUMAtriptan (Imitrex) 50 mg tablet oral   . Wegovy 0.25 mg, subcutaneous, Every 7 days        Objective    Objective     Vital Signs  There were no vitals taken for this visit.    Physical Exam  Constitutional:       Appearance: Normal appearance.   Pulmonary:      Effort: Pulmonary effort is normal.   Neurological:      Mental Status: She is alert.           Lab Results   Component Value Date    CHOL 225 (H) 04/20/2021    TRIG 80 04/20/2021    HDL 70 04/20/2021    LDLCALC 139 (H) 04/20/2021    HGBA1C 5.1 04/20/2021    WBC 5.5 04/20/2021    HGB 14.1 04/20/2021    HCT 42.6 04/20/2021    PLT 311  04/20/2021    ALT 24 04/20/2021    ALT 31 04/20/2021    AST 26 04/20/2021    NA 140 04/20/2021    K 3.6 04/20/2021    CL 105 04/20/2021    CREATININE 0.56 04/20/2021    BUN 3 (L) 04/20/2021    EGFR 112 04/20/2021    CO2 23 04/20/2021        Health Maintenance Due   Topic Date Due   . HIV Screening  Never done   . COVID-19 Vaccine (1) Never done   . MMR Vaccines (1 of 1 - Standard series) Never done   . Hepatitis A Vaccines (1 of 2 - Risk 2-dose series) Never done   . Zoster Vaccines (1 of 2) Never done   . Influenza Vaccine (1) 09/15/2021       Assessment/Plan    Asssessment/Plan     [name] [name] is a 51 y/o F with HLD, obesity, chronic viral hepatitis B, OSA, migraines who presents for f/u for multiple medical problems.    Right hand paresthesia  Assessment & Plan:  She endorses right hand tingling intermittently without pain or weakness. This happens almost daily and started about a month ago.     She is seen virtually but needs to f/u in person for physical exam/ neuro exam.     If exam fine, would consider carpal tunnel syndrome and trying wrist splint  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 coma (CMS/HCC)  Assessment & Plan:  She was seen by ID 12/2020 and referred by us in 08/2020. HBsAg+, HbcAB+, HBeAg - (09/05/20) HBV DNA VL >2901 LFT (09/05/20): AST 26, ALT 21, ALP 81, TB 0.6. ID rec HCV ab neg HDV Ag/Ab neg, fibortest-ACt test panel as well as US abdomen. They recommended treatment only if LFT>2 ULN or if patient with cirrhosis.  LFTs 04/2021  were wnl. Hep BeAg Non reactive but Ab reactive 04/2021. HBV viral load 1,698. Needs checked every 6 to 12 months.     Labs ordered today but patient will get when she returns for in person follow up.   Orders:  -     Comprehensive metabolic panel; Future  -     CBC and differential; Future  -     Comprehensive metabolic panel; Future  -     Hepatitis B core antibody, total; Future  -     Hepatitis B surface antibody Quant; Future  -     Hepatitis Be Antigen;  Future  -     Hepatitis Be Antibody; Future  -     Hepatitis B Virus DNA, Quantitative, RT-TMA; Future  -     Hepatitis B surface antigen; Future  -     Comprehensive metabolic panel  -     CBC and differential  -     Comprehensive metabolic panel  -     Hepatitis B core antibody, total  -     Hepatitis B surface antibody Quant  -     Hepatitis Be Antigen  -     Hepatitis Be Antibody  -     Hepatitis B Virus DNA, Quantitative, RT-TMA  -     Hepatitis B surface antigen  Depression, unspecified depression type  Assessment & Plan:  Patient has previously tried SSRI and felt it worsened her mood. Will trial buprprion 75mg  BID, start qday for one week then increases to BID.    Follow up in one month.   Orders:  -     buPROPion (Wellbutrin) 75 mg tablet; Take 1 tablet (75 mg) by mouth twice daily. Please take 1 tablet one time a day for one week then take 1 tablet two times a day.      The patient was staffed with the attending, Dr. Renaldo Reel          This real-time, interactive virtual Telehealth encounter was done by video with the patient's verbal consent. Two patient identifiers were used and confirmed. Physical location of the patient: home. Patient resides in: [place]  Physical location of the provider: office. Other participants/involvement: none  Total minutes spent: 20

## 2021-12-29 ENCOUNTER — Ambulatory Visit: Payer: PRIVATE HEALTH INSURANCE | Attending: Acupuncturist | Primary: Internal Medicine

## 2021-12-29 NOTE — Progress Notes (Deleted)
[facility]  [address] [place] [address]  Dept: [phone]  Dept Fax: [phone]      Chief Complaint:  No chief complaint on file.    Subjective    Subjective     HPI  51 y.o. female patient who presents for No chief complaint on file.[name] [name] is a 51 y/o F with HLD, obesity, chronic viral hepatitis B, OSA, migraines who presents for f/u for multiple medical problems.    Today she states she is having tingling of the right hand. This happens almost every day. She is not having any arm pain or weakness.     #Right sided back pain   #Right knee pain  Patient completed 9 sessions of PT Aug to Sept 2022.   She was taking about 2 tabs of NSAIDs daily. But she is now using diclofenac gel with great benefit in her knee pain and does not need oral NSIADs with this. She is now able to walk up and down stairs without difficulty. She would like to use this for her back pain as well.     Today she states that her back and knee pain are if anything worse. She feels it worse when it rains/storms.     #Obesity:  #HLD  She wanted to start [facility] for weight loss but unfortunately it was not covered by insurance and would be 600$ monthly.     #Chronic viral hepatitis B   She was seen by ID 12/2020 and referred by [place] in 08/2020. HBsAg+, HbcAB+, HBeAg - (09/05/20) HBV DNA VL >2901 LFT (09/05/20): AST 26, ALT 21, ALP 81, TB 0.6. ID rec HCV ab neg HDV Ag/Ab neg, fibortest-ACt test panel as well as US abdomen. They recommended treatment only if LFT>2 ULN or if patient with cirrhosis.  LFTs 04/2021 were wnl. Hep BeAg Non reactive but Ab reactive 04/2021. HBV viral load 1,698. Needs checked every 6 to 12 months.     #Migraines/Tension HA   She takes sumatriptan (8 tabes per month max), she has enough tabs.    She is unfortunately having a migraine today so she called out of work and is seen virtually. She typically has migraines at night and needs a form filled out that she will fax  to [place].     #OSA  #Depression  She is interested in trying a medication for depression but would like something activating.     #HCM:  Colonoscopy done 08/2021, needs repeat in 2026  Mammogram: 02/2020 suspicion, had u/s aspiration w/ no samples sent and annual mammo rec. Ordered 02/2021, patient to schedule  S/p TAH and BSO in 2018, no longer requires pap smears   TDAP 2017  COVID  x 3   Lipids 04/2021: TC 225, TG 80, LDL 139, HDL 70, repeat lipids today  HbA1C 04/2021: 5.1               Social History     Tobacco Use   . Smoking status: Never   . Smokeless tobacco: Never   Vaping Use   . Vaping Use: Never used   Substance Use Topics   . Alcohol use: Not Currently   . Drug use: Never                               Allergies   Allergen Reactions   . Chloroquine  Phosphate      Other reaction(s): hives, imbalance, tinnits   . Ferric Carboxymaltose Hives   . Penicillin G Benzathine      Other reaction(s): hives, swelling   . Chloroquine Headache, Hives, Rash and Tinnitus        Current Outpatient Medications   Medication Instructions   . buPROPion (WELLBUTRIN) 75 mg, oral, 2 times daily, Please take 1 tablet one time a day for one week then take 1 tablet two times a day.   . diclofenac (Voltaren) 1 % topical gel topical (top), 4 times daily PRN   . estradiol (Climara) 0.1 mg/24 hr PLACE 1 PATCH ON THE SKIN 1 TIME PER WEEK.   . SUMAtriptan (Imitrex) 50 mg tablet oral   . Wegovy 0.25 mg, subcutaneous, Every 7 days        Objective    Objective     Vital Signs  There were no vitals taken for this visit.    Physical Exam  Constitutional:       Appearance: Normal appearance.   Pulmonary:      Effort: Pulmonary effort is normal.   Neurological:      Mental Status: She is alert.           Lab Results   Component Value Date    CHOL 225 (H) 04/20/2021    TRIG 80 04/20/2021    HDL 70 04/20/2021    LDLCALC 139 (H) 04/20/2021    HGBA1C 5.1 04/20/2021    WBC 5.5 04/20/2021    HGB 14.1 04/20/2021    HCT 42.6 04/20/2021    PLT 311  04/20/2021    ALT 24 04/20/2021    ALT 31 04/20/2021    AST 26 04/20/2021    NA 140 04/20/2021    K 3.6 04/20/2021    CL 105 04/20/2021    CREATININE 0.56 04/20/2021    BUN 3 (L) 04/20/2021    EGFR 112 04/20/2021    CO2 23 04/20/2021        Health Maintenance Due   Topic Date Due   . HIV Screening  Never done   . COVID-19 Vaccine (1) Never done   . MMR Vaccines (1 of 1 - Standard series) Never done   . Hepatitis A Vaccines (1 of 2 - Risk 2-dose series) Never done   . Zoster Vaccines (1 of 2) Never done   . Influenza Vaccine (1) 09/15/2021       Assessment/Plan    Asssessment/Plan     [name] [name] is a 51 y/o F with HLD, obesity, chronic viral hepatitis B, OSA, migraines who presents for f/u for multiple medical problems.    There are no diagnoses linked to this encounter.    The patient was staffed with the attending, Dr. Renaldo ReelHuang          This real-time, interactive virtual Telehealth encounter was done by video with the patient's verbal consent. Two patient identifiers were used and confirmed. Physical location of the patient: home. Patient resides in: [place]  Physical location of the provider: office. Other participants/involvement: none  Total minutes spent: 20

## 2022-01-25 NOTE — Telephone Encounter (Signed)
Patient is requesting to have a revision of their mammogram order. Patient states that they are experiencing right breast pain and will need to have a diagnostic mammogram ordered. Please assist.       Best call back number [phone]

## 2022-01-25 NOTE — Telephone Encounter (Signed)
Covering for Dr Ronalee Red while out of office      Dr Raiford Simmonds or Jonna Coup NP - have either of you discussed breast pain with this patient? If not ideally patient should be evaluated for this concern     Greig Right on 01/25/22 at 12:27 PM.

## 2022-01-26 NOTE — Telephone Encounter (Signed)
Called pt, unable to reach her, LMOM telling her it would be best if she could come in for breast exam but in the meantime i've changed her screening mammogram order to a diagnostic mammogram and [place] according to radiology recs for anyone with a breast complaint. Told her to please come for an exam when able.

## 2022-02-12 ENCOUNTER — Ambulatory Visit: Payer: PRIVATE HEALTH INSURANCE | Attending: Internal Medicine | Primary: Internal Medicine

## 2022-02-26 ENCOUNTER — Ambulatory Visit: Payer: PRIVATE HEALTH INSURANCE | Attending: Internal Medicine | Primary: Internal Medicine

## 2022-03-08 ENCOUNTER — Inpatient Hospital Stay: Admit: 2022-03-08 | Payer: PRIVATE HEALTH INSURANCE

## 2022-03-08 ENCOUNTER — Ambulatory Visit: Payer: PRIVATE HEALTH INSURANCE | Primary: Internal Medicine

## 2022-03-08 DIAGNOSIS — N644 Mastodynia: Secondary | ICD-10-CM

## 2022-03-08 DIAGNOSIS — N6011 Diffuse cystic mastopathy of right breast: Secondary | ICD-10-CM

## 2022-03-16 NOTE — Telephone Encounter (Signed)
I called the patient to schedule an appointment for an Annual Wellness./Physical Exam(PE).  Thank you Rula Keniston- Population Health LCO-Team

## 2022-04-04 NOTE — Telephone Encounter (Signed)
I called the patient to schedule an appointment for an Annual Wellness./Physical Exam(PE).  Thank you Iridessa Harrow- Population Health LCO-Team
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
## 2022-06-20 MED ORDER — buPROPion (Wellbutrin) 75 mg tablet
75 | ORAL_TABLET | Freq: Two times a day (BID) | ORAL | 1 refills | Status: DC
Start: 2022-06-20 — End: 2022-06-26

## 2022-06-20 NOTE — Telephone Encounter (Signed)
Refilled med

## 2022-06-20 NOTE — Telephone Encounter (Signed)
[facility] request refill for bupropion 75 mg 1 tablet twice daily. Prescription has expired.  Lennie Hummer, RN   4:47 PM   06/20/22

## 2022-06-26 MED ORDER — buPROPion (Wellbutrin) 75 mg tablet
75 | ORAL_TABLET | Freq: Two times a day (BID) | ORAL | 0 refills | Status: AC
Start: 2022-06-26 — End: 2022-09-24

## 2023-05-10 ENCOUNTER — Ambulatory Visit: Payer: PRIVATE HEALTH INSURANCE | Attending: Acupuncturist

## 2023-05-27 ENCOUNTER — Ambulatory Visit: Payer: PRIVATE HEALTH INSURANCE | Attending: Acupuncturist

## 2023-06-12 ENCOUNTER — Ambulatory Visit: Admit: 2023-06-12 | Discharge: 2023-06-12 | Payer: PRIVATE HEALTH INSURANCE | Attending: Acupuncturist

## 2023-06-12 DIAGNOSIS — Z0000 Encounter for general adult medical examination without abnormal findings: Secondary | ICD-10-CM

## 2023-06-12 DIAGNOSIS — Z0001 Encounter for general adult medical examination with abnormal findings: Secondary | ICD-10-CM

## 2023-06-12 NOTE — Assessment & Plan Note (Addendum)
-  COVID: vaccinated+ bivalent booster   -TDAP/Tetanus: 02/28/23  -Influenza: discussed, obtains yearly  - HIV (age 52-65): discussed  - Hep C (age 50-79): discussed  - Hep B (high risk): discussed  -HPV- age out, discussed  -STD testing: defer  -Colonoscopy (45-75, q10y at 68), or stool based studies (FIT q1y,  Cologuard q3y) z12.11 or z12.12: 09/08/21  -Lung CA (Smoke 50-80 w/20p yr or quit last 51yr): non cigarette smoker  -AAA (65-75 with history of smoking, 55 in men with fam hx of AAA): non cigarette smoker  -Breast CA (at age 43): not due  -Breast Exam - discussed  -Mammo (40/50--74, every 2 yr): 03/08/22  [email or web address]  -Cervical Cancer: Pap smear w/ HPV co-testing (21-29 Q3 yr/30-65 Q5 yr) or q3y if no co-testing: S/p TAH and BSO in 2018, no longer requires pap smears   -DEXA (65q2 if osteopenia, if normal can do repeat at 10 years): not due  -Pneumonia/PNA (65 or <65 if smoke, asthma, DM, other chronic dz; >65, PVC 13 1st, PCV20/Prevnar 20): not due  - Shingles/Zoster (at age 41: >59, 2nd dose 2-6 mon): 02/28/23  May schedule Shingles vaccine with [facility] [phone]. Must call offered 2-3 times monthly, dates regularly change.  - DM (25-45 with HTN or BMI 25 (or BMI 23 in Asians) w/1+ DM risk factor OR >39 years of age ; q62y if a1c > 5.7 or HIV; q3y if normal) with A1c, FPG, OGTT:  Results from last 7 days   Lab Units 06/12/23  1434   HEMOGLOBIN A1C % 5.1     -HLD (q5y starting in 20s OR x1 before 30, x1 at 30 if CVD RF, x1 for 50M and 51F if no RF. q1y if DM, q3y if near threshold, q5y if normal):    (statin for age 17-75 with 1 CV risk factor and 10-yr ASCVD >10%)  Results from last 7 days   Lab Units 06/12/23  1434   CHOLESTEROL mg/dL 829*   TRIGLYCERIDES mg/dL 562   HDL mg/dL 57     - Depression: PHQ-2:  Over the past 2 weeks, how often have you been bothered by any of the following problems?  Little interest or pleasure in doing things: Not at all  Feeling  down, depressed, or hopeless: Not at all  Patient Health Questionnaire-2 Score: 0  -Tobacco: non cigarette smoker  -Alcohol: social  -Drugs: denies  -Safety: safe  -Discussed wearing sunscreen, routine eye/dental exams, sleep hygiene, diet/exercise, seatbelts, anti-tobacco usage, moderation of alcohol usage, safe sex practices.   -increasing the vegetable and fruit intake, increasing the intake of high-fiber, whole-grain foods, increasing water consumption, and reducing consumption of dietary sugar and sugar-sweetened beverages.  Discussed that referral specialist for vision or dental care. Dentist q31mo advised. Ophtho q2years advised.   -Derm: self skin checks monthly, SPF/protective clothing use advised. referral for skin exam if need  -ETOH limits counseling.

## 2023-06-12 NOTE — Assessment & Plan Note (Addendum)
 Mamm pending  Orders:    BI BILATERAL MAMMOGRAM SCREENING; Future

## 2023-06-12 NOTE — Assessment & Plan Note (Addendum)
 Asked wegovy  Plan to check insurance coverage

## 2023-06-12 NOTE — Assessment & Plan Note (Addendum)
 Hepatitis B (sAg, sAb, cAb, eAg, eAb, DNA, AFP)  Lab Results   Component Value Date/Time    Hepatitis B surface antigen See Hepatitis B Surface Antigen confirmation 04/20/2021 04:03 PM    Hep B Surf Ag Scr Confirm. indicated 06/12/2023 02:34 PM    Hepatitis B surface antigen confirmation Positive (A) 04/20/2021 04:03 PM    HBsAg Conf Positive (A) 06/12/2023 02:34 PM    Hepatitis B surface antibody Reactive 04/20/2021 04:03 PM    Hep B Surf Ab Qn 6.4 (L) 06/12/2023 02:34 PM    Hepatitis B surface antibody conc 23.60 04/20/2021 04:03 PM    Hepatitis B core antibody, total Reactive (A) 04/20/2021 04:03 PM    Hep B Core Total Ab Positive (A) 06/12/2023 02:34 PM    Hep Be Antigen NON-REACTIVE 04/20/2021 04:03 PM    Hep Be Ag Negative 06/12/2023 02:34 PM    Hep Be Ab Reactive (A) 06/12/2023 02:34 PM    HBV IU/mL 700 (H) 06/12/2023 02:34 PM     No results found for: "AFP"     GI pending    Orders:    CBC and differential; Future    Comprehensive metabolic panel; Future    Hepatitis B core antibody, total; Future    Hepatitis B surface antibody Quant; Future    Hepatitis B surface antigen; Future    Hepatitis B Virus DNA, Quantitative, RT-TMA; Future    Hepatitis Be Antibody; Future    Hepatitis Be Antigen; Future    US  ABDOMEN COMPLETE; Future    INT  [facility] Gastroenterology (T GI); Future

## 2023-06-12 NOTE — Assessment & Plan Note (Addendum)
 Patient has previously tried SSRI and felt it worsened her mood.   11/23/21 trial buprprion 75mg  BID, start qday for one week then increases to BID.  However, pt stop because lost insurance  Orders:    Referral to Behavioral Health Teletherapy/Synchronous; Future

## 2023-06-12 NOTE — Patient Instructions (Signed)
 Counseling Resource Finder:  www.psychologytoday.com  [place] Behavioral Health Help Line [facility]): [phone].    Call or text 24/7 365 day per year and is available for all residents of [place].  Emergency services are available to all, irrespective of insurance status or carrier. This program provides clinical assessment and direct callers to local [facility].
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
## 2023-06-12 NOTE — Assessment & Plan Note (Addendum)
 Continue migraine home care

## 2023-06-13 LAB — LIPID PANEL
Cholesterol: 253 mg/dL — ABNORMAL HIGH (ref 100–199)
HDL Cholesterol: 57 mg/dL (ref >39)
LDLc Calc (NIH): 172 mg/dL — ABNORMAL HIGH (ref 0–99)
Non-HDL Chol: 196 mg/dL — ABNORMAL HIGH (ref 0–129)
Triglycerides: 134 mg/dL (ref 0–149)
VLDLc Calc: 24 mg/dL (ref 5–40)

## 2023-06-13 LAB — COMPREHENSIVE METABOLIC PANEL
ALT: 19 IU/L (ref 0–32)
AST: 18 IU/L (ref 0–40)
Albumin: 4.6 g/dL (ref 3.8–4.9)
Alk Phosphatase: 93 IU/L (ref 44–121)
Anion Gap: 18 mmol/L (ref 10.0–18.0)
BUN/Creat Ratio: 14 (ref 9–23)
BUN: 7 mg/dL (ref 6–24)
Bili Total: 0.3 mg/dL (ref 0.0–1.2)
Calcium: 9.8 mg/dL (ref 8.7–10.2)
Carbon Dioxide: 22 mmol/L (ref 20–29)
Chloride: 102 mmol/L (ref 96–106)
Creat: 0.51 mg/dL — ABNORMAL LOW (ref 0.57–1.00)
Globulin Total: 3 g/dL (ref 1.5–4.5)
Glucose: 80 mg/dL (ref 70–99)
Potassium: 3.9 mmol/L (ref 3.5–5.2)
Protein Total: 7.6 g/dL (ref 6.0–8.5)
Sodium: 142 mmol/L (ref 134–144)
eGFR: 113 mL/min/{1.73_m2} (ref >59)

## 2023-06-13 LAB — TSH W/ REFLEX FREE T4: TSH: 2.25 u[IU]/mL (ref 0.450–4.500)

## 2023-06-13 LAB — URIC ACID: Uric Acid: 4.7 mg/dL (ref 3.0–7.2)

## 2023-06-13 LAB — FOLATE: Folate: 13 ng/mL (ref >3.0)

## 2023-06-13 LAB — CBC W/DIFF
Baso Abs: 0 10*3/uL (ref 0.0–0.2)
Basos: 0 %
Eos Abs: 0.1 10*3/uL (ref 0.0–0.4)
Eos: 3 %
Hct: 42.6 % (ref 34.0–46.6)
Hgb: 13.9 g/dL (ref 11.1–15.9)
Immature Grans Abs: 0 10*3/uL (ref 0.0–0.1)
Immature Granulocytes: 0 %
Lymphs Abs: 2.1 10*3/uL (ref 0.7–3.1)
Lymphs: 48 %
MCH: 29.3 pg (ref 26.6–33.0)
MCHC: 32.6 g/dL (ref 31.5–35.7)
MCV: 90 fL (ref 79–97)
Monocytes: 7 %
MonocytesAbs: 0.3 10*3/uL (ref 0.1–0.9)
Neutrophils Abs: 1.9 10*3/uL (ref 1.4–7.0)
Neutrophils: 42 %
Platelets: 316 10*3/uL (ref 150–450)
RBC: 4.74 x10E6/uL (ref 3.77–5.28)
RDW: 12.5 % (ref 11.7–15.4)
WBC: 4.5 10*3/uL (ref 3.4–10.8)

## 2023-06-13 LAB — VITAMIN B12 WITH REFLEX TO MMA: Vitamin B12: 1111 pg/mL (ref 232–1245)

## 2023-06-13 LAB — HEPATITIS BE ANTIBODY: Hep Be Ab: REACTIVE — AB

## 2023-06-13 LAB — HEMOGLOBIN A1C: HgbA1C: 5.1 % (ref 4.8–5.6)

## 2023-06-13 LAB — HEPATITIS B SURFACE ANTIGEN

## 2023-06-13 LAB — HEPATITIS B SURFACE ANTIBODY QUANT: Hep B Surf Ab Qn: 6.4 m[IU]/mL — ABNORMAL LOW

## 2023-06-13 LAB — HEPATITIS B CORE ANTIBODY, TOTAL: Hep B Core Total Ab: POSITIVE — AB

## 2023-06-13 LAB — C-REACTIVE PROTEIN: C-Reactive Protein Quant: 2 mg/L (ref 0–10)

## 2023-06-13 LAB — ANA (ANTINUCLEAR ANTIBODIES): ANA, Direct: NEGATIVE

## 2023-06-13 LAB — RHEUMATOID FACTOR: RA Latex Turbid: 13.9 [IU]/mL (ref <14.0)

## 2023-06-13 LAB — SEDIMENTATION RATE, AUTOMATED: Sed Rate-Westergren: 19 mm/h (ref 0–40)

## 2023-06-13 LAB — CK: CK Total: 187 U/L — ABNORMAL HIGH (ref 32–182)

## 2023-06-13 LAB — HEPATITIS B SURFACE ANTIGEN CONFIRMATION (REFLEX): HBsAg Conf: POSITIVE — AB

## 2023-06-13 LAB — HEPATITIS BE ANTIGEN: Hep Be Ag: NEGATIVE

## 2023-06-14 LAB — HEPATITIS B VIRUS DNA, QUANTITATIVE, RT-TMA
HBV IU/mL: 700 [IU]/mL — ABNORMAL HIGH
log10 HBV IU/mL: 2.845 {Log_IU}/mL

## 2023-06-15 LAB — VITAMIN B1 (THIAMINE),WHOLE BLOOD: Vitamin B1 Whl Bld: 118.4 nmol/L (ref 66.5–200.0)

## 2023-06-17 NOTE — Result Quicknote (Signed)
 Called pt around 1:13 PM on June 17, 2023 and [facility]  to explain labs and Tx plan.   Recommend GI follow up for hep B

## 2023-06-17 NOTE — Telephone Encounter (Signed)
 The patient is returning a call from NP Lenny Rack to discuss lab test results. Please advise     Call back number: [phone]

## 2023-06-19 NOTE — Progress Notes (Unsigned)
 RXSP MEDICATION ACCESS - PHARMACY BENEFIT      RX PA approved for Wegovy 0.25 mg (drug) per [employer] name, phone #)    Plan type: Commercial    PA effective from 05/20/23(mm/dd/yyyy) to 01/15/24 (mm/dd/yyyy)  PA #: [id number]    RX can be filled at Any  (pharmacy name)  Copay information: $1200 (pt has deductible to meet)   Copay card can be applied: Yes.      approved letter is scanned into patient's medical record.    Signed  Clotilda Hafer, CPhT

## 2023-06-24 ENCOUNTER — Ambulatory Visit: Payer: PRIVATE HEALTH INSURANCE | Attending: Gastroenterology

## 2023-08-19 ENCOUNTER — Ambulatory Visit: Payer: PRIVATE HEALTH INSURANCE | Attending: Gastroenterology

## 2023-09-09 ENCOUNTER — Ambulatory Visit: Payer: PRIVATE HEALTH INSURANCE

## 2023-09-09 NOTE — Progress Notes (Unsigned)
 [facility]  [address] [place] [address]  Dept: [phone]  Dept Fax: [phone]      Chief Complaint:  No chief complaint on file.    Subjective    Subjective     HPI  53 y.o. female patient who presents for No chief complaint on file.. {Click here to update CC (Optional):37742}    This is my first time meeting this patient. Recheck Migraine and pain    Last seen for annual exam with NP on 06/12/2023. Additionally, discussed reapplying for Wegovy  with new insurance, daily headaches.     #Migraine  - Last visit note continue migraine home care  HPI:  Onset:  How often:  How long do they last  Timing of the day:  Onset sudden or graudal  Where is pain located  Quality of pain  Seviety   Interference with ADLs/work/sleep  Assoicated sx ( e.g. n/v, photophobia, phonophobia)  Triggers: related to stress, meals, sleep, etc.     Likely indicated for maintanence therapy >4 headache days per month, or >2 severe attacks. Start with propranolol or metoprolol, topiramate, CGRP   Work on lifestyle things, sleep hygiene, regular meals, avoiding triggers    #Lower extremity pain  Answers submitted by the patient for this visit:  Lower Extremity Injury Questionnaire (Submitted on 09/09/2023)  Chief Complaint: Lower extremity pain  Incident occurred: more than 1 week ago  Incident location: at home  Injury mechanism: a twisting injury  Pain location: left hip, left knee  Pain quality: aching  Pain - numeric: 8/10  Pain course: worsening  tingling: No  inability to bear weight: Yes  loss of motion: Yes  loss of sensation: No  muscle weakness: Yes  Foreign body present: no foreign bodies  No prior imaging, can consider hip XR  Possibly OA,   Trauma hx, steroid use, hx of cancer?     #Depression  Previously on SSRI however felt mood worsening. Also trialed buproprion 36m g BID. Referred to therapist.     #BMI >40  - on Wegovy  0.25      HPI    Social History     Tobacco Use    Smoking  status: Never    Smokeless tobacco: Never   Substance Use Topics    Alcohol use: Not Currently       {Click here to update allergies (Optional):37742}    Allergies[1]     Current Outpatient Medications   Medication Instructions    buPROPion  (WELLBUTRIN ) 75 mg, oral, 2 times daily, Please take 1 tablet one time a day for one week then take 1 tablet two times a day.    diclofenac  (Voltaren ) 1 % topical gel topical (top), 4 times daily PRN    estradiol  (Climara ) 0.1 mg/24 hr PLACE 1 PATCH ON THE SKIN 1 TIME PER WEEK.    SUMAtriptan (Imitrex) 50 mg tablet Take by mouth.    Wegovy  0.25 mg, subcutaneous, Every 7 days        Objective    Objective     Vital Signs  There were no vitals taken for this visit.    Physical Exam  {GAD7, PHQ9 (Optional):37742}    Lab Results   Component Value Date    CHOL 253 (H) 06/12/2023    TRIG 134 06/12/2023    HDL 57 06/12/2023    LDLCALC 172 (H) 06/12/2023    HGBA1C 5.1 06/12/2023    WBC 4.5 06/12/2023  HGB 13.9 06/12/2023    HCT 42.6 06/12/2023    PLT 316 06/12/2023    ALT 19 06/12/2023    AST 18 06/12/2023    NA 142 06/12/2023    K 3.9 06/12/2023    CL 102 06/12/2023    CREATININE 0.51 (L) 06/12/2023    BUN 7 06/12/2023    EGFR 113 06/12/2023    CO2 22 06/12/2023    TSH 2.250 06/12/2023        Health Maintenance Due   Topic Date Due    HIV Screening  Never done    MMR Vaccines (1 of 1 - Standard series) Never done    Hepatitis B Vaccines (1 of 3 - 19+ 3-dose series) 07/28/1990    Hepatitis A Vaccines (1 of 2 - Risk 2-dose series) Never done    Pneumococcal Vaccine: 50+ Years (1 of 2 - PCV) Never done    Zoster Vaccines (1 of 2) Never done    COVID-19 Vaccine (4 - 2024-25 season) 09/16/2022                               Assessment/Plan    Asssessment/Plan     53 y.o. female patient with hx of *** presents for No chief complaint on file..  Assessment & Plan      Follow-up:  No follow-ups on file.    The patient was staffed with the attending, Dr. Theophilus    {To finalize your  note and route to preceptor, make sure Cosign required box is checked, refresh note, then change bottom to Sign on Accept followed by Accept (Optional):37742}        Rosario LOIS Card, MD  PGY-2, Internal Medicine  [facility]           [1]   Allergies  Allergen Reactions    Chloroquine Phosphate      Other reaction(s): hives, imbalance, tinnits    Ferric Carboxymaltose Hives    Penicillin G Benzathine      Other reaction(s): hives, swelling    Chloroquine Headache, Hives, Rash and Tinnitus

## 2023-09-09 NOTE — Telephone Encounter (Signed)
 Patient is running late to appointment below : and would like to get a call instead of coming for appointment please [phone]

## 2023-09-12 ENCOUNTER — Ambulatory Visit: Payer: PRIVATE HEALTH INSURANCE

## 2023-09-12 MED ORDER — semaglutide, weight loss, (Wegovy) 0.25 mg/0.5 mL pen injector
0.25 | SUBCUTANEOUS | 0 refills | 28.00000 days | Status: DC
Start: 2023-09-12 — End: 2023-09-25

## 2023-09-12 NOTE — Telephone Encounter (Signed)
 Patient called to request to send medication named below to alternate pharmacy (indicated below)    semaglutide , weight loss, (Wegovy ) 0.25 mg/0.5 mL pen injector     Pharmacy:  [facility] - [place], [place] - [facility] [phone]     Patient is requesting to expedite.     Call back number:  [phone]

## 2023-09-18 ENCOUNTER — Ambulatory Visit: Payer: PRIVATE HEALTH INSURANCE

## 2023-09-18 NOTE — Progress Notes (Unsigned)
 [facility]  [address] [place] [address]  Dept: [phone]  Dept Fax: [phone]      Chief Complaint:  No chief complaint on file.    Subjective    Subjective     HPI  Yetta Gladue is a 53 year-old female with PMHx of chronic viral hepatitis B, OSA, HA, depression who presents with 2 week history of left knee and left foot pain.     Seen at [facility] ED for left foot and left knee pain on 09/12/2023. Patient reported that her left knee pain started 1 week prior to presentation, and now affecting left foot. XRs performed which were negative for fractures or dislocations. Ddx include strain vs sprain vs overuse vs arthritis. She was prescribed naproxen 500 mg twice daily and Tylenol 1000 q6h for 5 days.     Answers submitted by the patient for this visit:  Lower Extremity Injury Questionnaire (Submitted on 09/18/2023)  Chief Complaint: Lower extremity pain  Incident occurred: more than 1 week ago  Incident location: in the street  Injury mechanism: unknown  Pain location: left hip, left knee, left ankle  Pain quality: aching  Pain - numeric: 8/10  Pain course: fluctuating  tingling: Yes  inability to bear weight: Yes  loss of motion: Yes  loss of sensation: No  muscle weakness: Yes  Foreign body present: no foreign bodies    HPI    Social History     Tobacco Use    Smoking status: Never    Smokeless tobacco: Never   Substance Use Topics    Alcohol use: Not Currently       {Click here to update allergies (Optional):37742}    Allergies[1]     Current Outpatient Medications   Medication Instructions    buPROPion  (WELLBUTRIN ) 75 mg, oral, 2 times daily, Please take 1 tablet one time a day for one week then take 1 tablet two times a day.    diclofenac  (Voltaren ) 1 % topical gel topical (top), 4 times daily PRN    estradiol  (Climara ) 0.1 mg/24 hr PLACE 1 PATCH ON THE SKIN 1 TIME PER WEEK.    SUMAtriptan (Imitrex) 50 mg tablet Take by mouth.    Wegovy  0.25 mg, subcutaneous,  Every 7 days        Objective    Objective     Vital Signs  There were no vitals taken for this visit.    Physical Exam  {GAD7, PHQ9 (Optional):37742}    Lab Results   Component Value Date    CHOL 253 (H) 06/12/2023    TRIG 134 06/12/2023    HDL 57 06/12/2023    LDLCALC 172 (H) 06/12/2023    HGBA1C 5.1 06/12/2023    WBC 4.5 06/12/2023    HGB 13.9 06/12/2023    HCT 42.6 06/12/2023    PLT 316 06/12/2023    ALT 19 06/12/2023    AST 18 06/12/2023    NA 142 06/12/2023    K 3.9 06/12/2023    CL 102 06/12/2023    CREATININE 0.51 (L) 06/12/2023    BUN 7 06/12/2023    EGFR 113 06/12/2023    CO2 22 06/12/2023    TSH 2.250 06/12/2023        Health Maintenance Due   Topic Date Due    HIV Screening  Never done    MMR Vaccines (1 of 1 - Standard series) Never done    Hepatitis B Vaccines (  1 of 3 - 19+ 3-dose series) 07/28/1990    Hepatitis A Vaccines (1 of 2 - Risk 2-dose series) Never done    Pneumococcal Vaccine: 50+ Years (1 of 2 - PCV) Never done    Zoster Vaccines (1 of 2) Never done    COVID-19 Vaccine (4 - 2025-26 season) 09/16/2023    Influenza Vaccine (1) 09/16/2023                               Assessment/Plan    Asssessment/Plan     53 y.o. female patient with hx of *** presents for No chief complaint on file..  Assessment & Plan      Follow-up:  No follow-ups on file.    The patient was staffed with the attending, Dr. Theophilus    {To finalize your note and route to preceptor, make sure Cosign required box is checked, refresh note, then change bottom to Sign on Accept followed by Accept (Optional):37742}        Rosario LOIS Card, MD  PGY-2, Internal Medicine  [facility]           [1]   Allergies  Allergen Reactions    Chloroquine Phosphate      Other reaction(s): hives, imbalance, tinnits    Ferric Carboxymaltose Hives    Penicillin G Benzathine      Other reaction(s): hives, swelling    Chloroquine Headache, Hives, Rash and Tinnitus

## 2023-09-25 MED ORDER — semaglutide, weight loss, (Wegovy) 0.25 mg/0.5 mL pen injector
0.25 | SUBCUTANEOUS | 0 refills | 28.00000 days | Status: AC
Start: 2023-09-25 — End: ?

## 2023-09-25 NOTE — Telephone Encounter (Signed)
 Change to [facility] pharmacy due to high cost copayment.   Phoung Sharl, RN  09/25/23  1:02 PM

## 2023-09-25 NOTE — Telephone Encounter (Signed)
 Lvm for pt to give us  a call back to get an appointment scheduled

## 2023-09-25 NOTE — Telephone Encounter (Signed)
 Department Name: Primary Care    Patient: [name] [name]  MRN: [id number]  Agent: Raeann Mom    [facility] Scheduling Message    Patient calling with the following Priority symptoms(s):Pain in limbs    PCP: Dr. Cleavon  Provider preference (if any): n/a  Preferred availability (if any): Friday    Call back number: [phone]

## 2023-09-27 NOTE — Telephone Encounter (Signed)
 Scheduled pt for an appointment for pain and wegovy  9/27 with Katlin Fister, PA

## 2023-10-03 NOTE — Telephone Encounter (Signed)
 [facility] advised a new order would need to be put in for Mammogram with a diagnosis including tom synthesis .  Any questions please call [phone]  Thank You

## 2023-10-03 NOTE — Telephone Encounter (Signed)
 Done

## 2023-10-12 ENCOUNTER — Ambulatory Visit: Payer: PRIVATE HEALTH INSURANCE | Attending: Physician Assistant
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
## 2023-10-18 ENCOUNTER — Ambulatory Visit: Payer: PRIVATE HEALTH INSURANCE

## 2023-11-15 ENCOUNTER — Ambulatory Visit: Admit: 2023-11-15 | Payer: PRIVATE HEALTH INSURANCE | Attending: Gastroenterology

## 2023-11-15 ENCOUNTER — Ambulatory Visit: Admit: 2023-11-15 | Discharge: 2023-11-15 | Payer: PRIVATE HEALTH INSURANCE

## 2023-11-15 DIAGNOSIS — B181 Chronic viral hepatitis B without delta-agent: Principal | ICD-10-CM
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
## 2023-11-15 NOTE — Progress Notes (Signed)
 Patient Information  Name: [name] [name]  Date of Birth: 1970/05/25  BMI: Estimated body mass index is 43.93 kg/m as calculated from the following:    Height as of an earlier encounter on 11/15/23: 1.651 m.    Weight as of an earlier encounter on 11/15/23: 119.7 kg.      FibroScan Results and Interpretation:    CAP score:  308 dB/m  E score:  5.0 kPa  IQR:   28 %    Degree of Steatosis:  S3 (Severe Steatosis) >= 280 dB/m  Degree of Fibrosis:  F0 (No Fibrosis) < 6.5 kPa    Please discuss these results with your treating provider for a comprehensive evaluation and management plan. The final interpretation should ultimately be per the treating provider's judgment. All interpretations should be made in conjunction with the clinical context and/or other diagnostic modalities.      Dior Stepter, MD  [facility]    Interpretation  The following interpretations are based on prior trials:    Steatosis Interpretation [1]   S0 (No Steatosis) < 248 dB/m   S1 (Mild Steatosis) 248 - 267 dB/m   S2 (Moderate Steatosis) 268 - 279 dB/m   S3 (Severe Steatosis) >= 280 dB/m     Fibrosis Interpretation [2]   F0 (No Fibrosis) < 6.5 kPa   F1 (Mild Fibrosis) 6.5 - 7.1 kPa   F2 (Moderate Fibrosis) 7.2 - 9.5 kPa   F3 (Severe Fibrosis) 9.6 - 14.4 kPa   F4 (Cirrhosis) >= 14.5 kPa     Additional Notes  According to the American Association for the Study of Liver Diseases (AASLD), in the setting of MASLD, the use of resmetirom is recommended if the elastography score is 8 kPa - 15 kPa [3]  According to the AASLD [4], consider the presence of clinically significant portal hypertension (CSPH) for individuals with BMI <= 30 if the elastography score is:   >= 15 kPa plus platelet count < 110,   20-25 kPa plus platelet count < 150, or   >= 25 kPa    References:  [1] Carlo DASEN, Petroff D, Sasso M, Fan JG, Mi YQ, de [place], [place], Lupsor-Platon M, [place], Cardoso AC, Ferraioli G, Chan WK, [place], [employer], Chayama K, Friedrich-Rust M, Beaugrand M, Shen F, Hiriart JB, Sarin SK, Badea R, Jung KS, Marcellin P, Filice C, Mahadeva S, Wong GL, Crotty P, Masaki K, Bojunga J, Bedossa P, Keim V, Wiegand J. Individual patient data meta-analysis of controlled attenuation parameter (CAP) technology for assessing steatosis. J Hepatol. 2017 May;66(5):1022-1030. doi: 10.1016/j.jhep.2016.12.022. Epub 2016 Dec 28. PMID: 71960900.    [2] Tsochatzis EA, Gurusamy KS, Ntaoula S, Cholongitas E, Chewey, Burroughs AK. Elastography for the diagnosis of severity of fibrosis in chronic liver disease: a meta-analysis of diagnostic accuracy. J Hepatol. 2011 Apr;54(4):650-9. doi: 10.1016/j.jhep.2010.07.033. Epub 2010 Sep 24. PMID: 78853107.    [3] Laurence Jerrell PETER Joesph, Timothy R.2,3; Irena Cecily Dayhoff, Heather M.5,6; Richrd Broach; [place], New YorkMERL Luke MOHR Mjb88. Resmetirom therapy for metabolic dysfunction-associated steatotic liver disease: October 2024 updates to AASLD Practice Guidance. Hepatology ():10.1097/HEP.0000000000001112, November 02, 2022.  DOI: 10.1097/HEP.0000000000001112     [4] Debrah SHEERER, Ripoll C, Thiele M, Fortune BE, Simonetto DA, Garcia-Tsao G, Bosch J. AASLD Practice Guidance on risk stratification and management of portal hypertension and varices in cirrhosis. Hepatology. 2024 May 1;79(5):1180-1211. doi: 10.1097/HEP.0000000000000647. Epub 2023 Oct 23. PMID: 62129701.        Patient ID: [name]  DAFNEY [name] is a 53 y.o. female.    Liver Elastography - In Office  Date/Time: 11/15/2023 2:14 PM    Performed by: Florette Love, [place]  Authorized by: Emery Jan, MD    Fibroscan  Procedure Details:      Fibroscan:   Fibrosis Rationale:  The Food and Drug Administration (FDA) has approved market clearance for Fibroscan, a novel medical device that measures liver stiffness. Vibration controlled elastrography with Fibroscan use a modified ultrasound probe to measure the velocity of a shear wave created by  vibratory source. Establishing the presence of fibrosis or cirrhosis in patients with chronic liver disease is important for assessment of prognosis and for evidence of progressive disease (fibrosis). The diagnosis of cirrhosis is important to initiate screening for varices and liver cancer. A Fibroscan can be used as a noninvasive alternative to a liver biopsy. Liver biopsies carry a small risk for patients, may be associated with procedure discomfort, and interpretation can be affected by sampling error and interpreter variability.  Fibroscan Procedure:  Patient was placed in the supine position with right hand elevated. Percussion of liver to find optimal position was performed. Using a standard probe, a 50 Hz ultrasound wave was introduced into liver with measurements of shear velocity. 2D image and elastrogram were evaluated for good transmission and positioning.        Assessments      1. Fatty (change of) liver, not elsewhere classified - K76.0 (Primary)    Treatment  1. Fatty (change of) liver, not elsewhere classified   PROCEDURE: Fibroscan    Procedures  Fibroscan GI:   Any chance of pregnancy? No  Do you have any implanted devices? No  Has patient been fasting for at least 4 hours? No  Results   ALT:19  BMI:  43.93  Probe Used:xL  Score:308/5.0  IQR/med (%):28%  Number of Attempts:10  Number of Measurements Obtained: 10  Performed by: AA  Interpretation   .

## 2023-11-16 LAB — HEPATITIS A AB, TOTAL W/REFL IGM: Hep A Ab Tot: POSITIVE — AB

## 2023-11-16 LAB — PROTIME-INR
INR: 1 (ref 0.9–1.2)
Prothrombin Time: 10.3 s (ref 9.1–12.0)

## 2023-11-16 LAB — HEPATIC FUNCTION PANEL
ALT: 23 IU/L (ref 0–32)
AST: 23 IU/L (ref 0–40)
Albumin: 4.4 g/dL (ref 3.8–4.9)
Alk Phosphatase: 101 IU/L (ref 49–135)
Bili Total: 0.4 mg/dL (ref 0.0–1.2)
Bilirubin, Direct: 0.11 mg/dL (ref 0.00–0.40)

## 2023-11-16 LAB — AFP TUMOR: AFP (Roche): 2.9 ng/mL (ref 0.0–9.2)

## 2023-11-16 LAB — IRON, TRANSFERRIN, TIBC
Iron Bind.Cap.(TIBC): 317 ug/dL (ref 250–450)
Iron Saturation: 17 % (ref 15–55)
Iron: 53 ug/dL (ref 27–159)
Transferrin: 251 mg/dL (ref 192–364)
UIBC: 264 ug/dL (ref 131–425)

## 2023-11-16 LAB — IGG, IGA, IGM
IgA: 221 mg/dL (ref 87–352)
IgG: 1435 mg/dL (ref 586–1602)
IgM: 97 mg/dL (ref 26–217)

## 2023-11-16 LAB — BASIC METABOLIC PANEL
Anion Gap: 11 mmol/L (ref 10.0–18.0)
BUN/Creat Ratio: 12 (ref 9–23)
BUN: 7 mg/dL (ref 6–24)
Calcium: 9.7 mg/dL (ref 8.7–10.2)
Carbon Dioxide: 26 mmol/L (ref 20–29)
Chloride: 103 mmol/L (ref 96–106)
Creat: 0.59 mg/dL (ref 0.57–1.00)
Glucose: 109 mg/dL — ABNORMAL HIGH (ref 70–99)
Potassium: 3.6 mmol/L (ref 3.5–5.2)
Sodium: 140 mmol/L (ref 134–144)
eGFR: 108 mL/min/1.73 (ref >59)

## 2023-11-16 LAB — CBC W/DIFF
Baso Abs: 0 x10E3/uL (ref 0.0–0.2)
Basos: 0 %
Eos Abs: 0.1 x10E3/uL (ref 0.0–0.4)
Eos: 2 %
Hct: 40.1 % (ref 34.0–46.6)
Hgb: 13.3 g/dL (ref 11.1–15.9)
Immature Grans Abs: 0 x10E3/uL (ref 0.0–0.1)
Immature Granulocytes: 0 %
Lymphs Abs: 2.2 x10E3/uL (ref 0.7–3.1)
Lymphs: 39 %
MCH: 29.8 pg (ref 26.6–33.0)
MCHC: 33.2 g/dL (ref 31.5–35.7)
MCV: 90 fL (ref 79–97)
Monocytes: 7 %
MonocytesAbs: 0.4 x10E3/uL (ref 0.1–0.9)
Neutrophils Abs: 2.9 x10E3/uL (ref 1.4–7.0)
Neutrophils: 52 %
Platelets: 284 x10E3/uL (ref 150–450)
RBC: 4.46 x10E6/uL (ref 3.77–5.28)
RDW: 12.7 % (ref 11.7–15.4)
WBC: 5.6 x10E3/uL (ref 3.4–10.8)

## 2023-11-16 LAB — HEPATITIS A ANTIBODY, IGM: Hep A IgM Ab: NEGATIVE

## 2023-11-16 LAB — CELIAC DISEASE COMPREHENSIVE PANEL W/ REFLEX TO ENDOMYSIAL
Deam Gliad IgA Abs: 6 U (ref 0–19)
tTG IgA: <2 U/mL (ref 0–3)

## 2023-11-16 LAB — MITOCHONDRIA M2 ANTIBODY (IGG), EIA: M2 Mitochondrial Ab: <20 U (ref 0.0–20.0)

## 2023-11-16 LAB — FERRITIN: Ferritin: 393 ng/mL — ABNORMAL HIGH (ref 15–150)

## 2023-11-16 LAB — HIV AB/AG SCREEN: HIV Scr 4th Gen: NONREACTIVE

## 2023-11-16 LAB — CERULOPLASMIN: Ceruloplasmin: 25.3 mg/dL (ref 19.0–39.0)

## 2023-11-16 LAB — TSH: TSH: 2.25 u[IU]/mL (ref 0.450–4.500)

## 2023-11-17 LAB — ANA BY IFA RFLX TITER/PATTERN: ANA IFA: NEGATIVE

## 2023-11-18 LAB — HDV ANTIBODY, IGG/IGM: HDV Antibody, IgG/IgM: NONREACTIVE

## 2023-11-18 LAB — HEPATITIS B VIRUS DNA, QUANTITATIVE, RT-TMA
HBV IU/mL: 600 [IU]/mL — ABNORMAL HIGH
log10 HBV IU/mL: 2.778 {Log_IU}/mL

## 2023-11-18 LAB — HDV AB INTERPRETATION (REFLEX ONLY)

## 2023-11-18 LAB — ENHANCED LIVER FIBROSIS (ELF) SCORE: ELF(TM) Score: 9.39 (ref <9.80)

## 2023-11-19 LAB — PROTEIN ELECTROPHORESIS, SERUM
A/G Ratio: 1.1 (ref 0.7–1.7)
Albumin: 3.7 g/dL (ref 2.9–4.4)
Alpha1 Glob: 0.2 g/dL (ref 0.0–0.4)
Alpha2 Glob: 0.7 g/dL (ref 0.4–1.0)
Beta Glob: 1.1 g/dL (ref 0.7–1.3)
Gamma Glob: 1.4 g/dL (ref 0.4–1.8)
Glob Total: 3.4 g/dL (ref 2.2–3.9)
Protein Total: 7.1 g/dL (ref 6.0–8.5)

## 2023-11-19 LAB — ALPHA-1-ANTITRYPSIN PHENOTYPE W/ A1A: A-1-Antitrypsin: 130 mg/dL (ref 101–187)

## 2023-11-21 LAB — PHOSPHATIDYLETHANOL CONFIRMATION, B
PEth Interp: NEGATIVE
PEth level: NEGATIVE ng/mL

## 2023-11-23 LAB — ANTI-SMOOTH MUSCLE ANTIBODY, IGG: Anti-Smooth Muscle Ab by IFA: 1:40 {titer} — ABNORMAL HIGH

## 2023-12-04 NOTE — Telephone Encounter (Signed)
 Patient called has an appointment scheduled for 12/19/23 for an ultrasound, per patient would like to see if can be seen by Dr, Emery Jan on 12/19/23 for a follow up after the ultrasound appnt.      Callback number [phone]

## 2023-12-19 ENCOUNTER — Inpatient Hospital Stay: Admit: 2023-12-19 | Payer: PRIVATE HEALTH INSURANCE

## 2023-12-19 ENCOUNTER — Ambulatory Visit: Admit: 2023-12-19 | Discharge: 2023-12-19 | Payer: PRIVATE HEALTH INSURANCE

## 2023-12-19 DIAGNOSIS — R160 Hepatomegaly, not elsewhere classified: Secondary | ICD-10-CM

## 2023-12-19 DIAGNOSIS — B181 Chronic viral hepatitis B without delta-agent: Principal | ICD-10-CM
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
